# Patient Record
Sex: Female | Born: 1935 | Race: Black or African American | Hispanic: No | Marital: Married | Smoking: Former smoker
Health system: Southern US, Community
[De-identification: ages and names within clinical notes are randomized; demographics above are authoritative.]

## PROBLEM LIST (undated history)

## (undated) DIAGNOSIS — E119 Type 2 diabetes mellitus without complications: Secondary | ICD-10-CM

## (undated) DIAGNOSIS — N189 Chronic kidney disease, unspecified: Secondary | ICD-10-CM

## (undated) DIAGNOSIS — I10 Essential (primary) hypertension: Secondary | ICD-10-CM

## (undated) DIAGNOSIS — D649 Anemia, unspecified: Secondary | ICD-10-CM

## (undated) DIAGNOSIS — C801 Malignant (primary) neoplasm, unspecified: Secondary | ICD-10-CM

## (undated) DIAGNOSIS — G4730 Sleep apnea, unspecified: Secondary | ICD-10-CM

## (undated) HISTORY — DX: Malignant (primary) neoplasm, unspecified: C80.1

## (undated) HISTORY — DX: Essential (primary) hypertension: I10

## (undated) HISTORY — DX: Type 2 diabetes mellitus without complications: E11.9

## (undated) HISTORY — DX: Sleep apnea, unspecified: G47.30

---

## 1971-01-07 HISTORY — PX: TUBAL LIGATION: SHX77

## 1971-01-07 HISTORY — PX: CHOLECYSTECTOMY: SHX55

## 1980-01-07 HISTORY — PX: TOTAL ABDOMINAL HYSTERECTOMY: SHX209

## 1997-04-12 ENCOUNTER — Emergency Department (HOSPITAL_COMMUNITY): Admission: EM | Admit: 1997-04-12 | Discharge: 1997-04-12 | Payer: Self-pay | Admitting: Emergency Medicine

## 1997-12-20 ENCOUNTER — Ambulatory Visit (HOSPITAL_COMMUNITY): Admission: RE | Admit: 1997-12-20 | Discharge: 1997-12-20 | Payer: Self-pay | Admitting: Specialist

## 1998-04-02 ENCOUNTER — Ambulatory Visit (HOSPITAL_BASED_OUTPATIENT_CLINIC_OR_DEPARTMENT_OTHER): Admission: RE | Admit: 1998-04-02 | Discharge: 1998-04-02 | Payer: Self-pay | Admitting: Specialist

## 1998-04-02 ENCOUNTER — Encounter: Payer: Self-pay | Admitting: Specialist

## 2000-01-07 HISTORY — PX: BREAST REDUCTION SURGERY: SHX8

## 2001-11-30 ENCOUNTER — Encounter: Admission: RE | Admit: 2001-11-30 | Discharge: 2001-11-30 | Payer: Self-pay | Admitting: Internal Medicine

## 2002-01-03 ENCOUNTER — Ambulatory Visit (HOSPITAL_BASED_OUTPATIENT_CLINIC_OR_DEPARTMENT_OTHER): Admission: RE | Admit: 2002-01-03 | Discharge: 2002-01-03 | Payer: Self-pay | Admitting: Internal Medicine

## 2002-04-25 ENCOUNTER — Encounter: Payer: Self-pay | Admitting: Emergency Medicine

## 2002-04-25 ENCOUNTER — Inpatient Hospital Stay (HOSPITAL_COMMUNITY): Admission: EM | Admit: 2002-04-25 | Discharge: 2002-05-02 | Payer: Self-pay | Admitting: Emergency Medicine

## 2002-04-27 ENCOUNTER — Encounter: Payer: Self-pay | Admitting: Internal Medicine

## 2002-04-30 ENCOUNTER — Encounter: Payer: Self-pay | Admitting: Internal Medicine

## 2002-06-02 ENCOUNTER — Ambulatory Visit (HOSPITAL_BASED_OUTPATIENT_CLINIC_OR_DEPARTMENT_OTHER): Admission: RE | Admit: 2002-06-02 | Discharge: 2002-06-02 | Payer: Self-pay | Admitting: Critical Care Medicine

## 2002-09-21 ENCOUNTER — Encounter: Payer: Self-pay | Admitting: Gastroenterology

## 2002-09-21 ENCOUNTER — Ambulatory Visit (HOSPITAL_COMMUNITY): Admission: RE | Admit: 2002-09-21 | Discharge: 2002-09-21 | Payer: Self-pay | Admitting: Gastroenterology

## 2003-05-11 ENCOUNTER — Emergency Department (HOSPITAL_COMMUNITY): Admission: EM | Admit: 2003-05-11 | Discharge: 2003-05-11 | Payer: Self-pay | Admitting: *Deleted

## 2003-12-19 ENCOUNTER — Ambulatory Visit: Payer: Self-pay | Admitting: Internal Medicine

## 2003-12-26 ENCOUNTER — Ambulatory Visit: Payer: Self-pay | Admitting: Internal Medicine

## 2004-01-30 ENCOUNTER — Ambulatory Visit: Payer: Self-pay | Admitting: Internal Medicine

## 2004-10-02 ENCOUNTER — Inpatient Hospital Stay (HOSPITAL_COMMUNITY): Admission: AD | Admit: 2004-10-02 | Discharge: 2004-10-05 | Payer: Self-pay | Admitting: *Deleted

## 2004-10-03 ENCOUNTER — Encounter (INDEPENDENT_AMBULATORY_CARE_PROVIDER_SITE_OTHER): Payer: Self-pay | Admitting: Cardiology

## 2004-10-16 ENCOUNTER — Ambulatory Visit: Payer: Self-pay | Admitting: Oncology

## 2004-12-09 ENCOUNTER — Ambulatory Visit: Payer: Self-pay | Admitting: Oncology

## 2005-01-01 ENCOUNTER — Encounter: Admission: RE | Admit: 2005-01-01 | Discharge: 2005-01-01 | Payer: Self-pay | Admitting: Nephrology

## 2005-01-27 ENCOUNTER — Ambulatory Visit: Payer: Self-pay | Admitting: Oncology

## 2005-01-30 ENCOUNTER — Encounter: Admission: RE | Admit: 2005-01-30 | Discharge: 2005-01-30 | Payer: Self-pay | Admitting: Nephrology

## 2005-03-13 ENCOUNTER — Encounter (INDEPENDENT_AMBULATORY_CARE_PROVIDER_SITE_OTHER): Payer: Self-pay | Admitting: Specialist

## 2005-03-13 ENCOUNTER — Ambulatory Visit (HOSPITAL_COMMUNITY): Admission: RE | Admit: 2005-03-13 | Discharge: 2005-03-13 | Payer: Self-pay | Admitting: Gastroenterology

## 2005-03-17 ENCOUNTER — Ambulatory Visit: Payer: Self-pay | Admitting: Oncology

## 2005-04-15 LAB — CBC WITH DIFFERENTIAL/PLATELET
Basophils Absolute: 0 10*3/uL (ref 0.0–0.1)
EOS%: 7.9 % — ABNORMAL HIGH (ref 0.0–7.0)
Eosinophils Absolute: 0.4 10*3/uL (ref 0.0–0.5)
HCT: 36.9 % (ref 34.8–46.6)
HGB: 11.8 g/dL (ref 11.6–15.9)
MCH: 24.4 pg — ABNORMAL LOW (ref 26.0–34.0)
MCV: 76.5 fL — ABNORMAL LOW (ref 81.0–101.0)
MONO%: 6.3 % (ref 0.0–13.0)
NEUT#: 3 10*3/uL (ref 1.5–6.5)
NEUT%: 56.3 % (ref 39.6–76.8)
RDW: 18.7 % — ABNORMAL HIGH (ref 11.3–14.5)
lymph#: 1.6 10*3/uL (ref 0.9–3.3)

## 2005-04-15 LAB — IRON AND TIBC
%SAT: 14 % — ABNORMAL LOW (ref 20–55)
Iron: 43 ug/dL (ref 42–145)
UIBC: 258 ug/dL

## 2005-05-04 ENCOUNTER — Ambulatory Visit: Payer: Self-pay | Admitting: Oncology

## 2005-05-06 LAB — CBC WITH DIFFERENTIAL/PLATELET
BASO%: 0.4 % (ref 0.0–2.0)
Basophils Absolute: 0 10*3/uL (ref 0.0–0.1)
EOS%: 7 % (ref 0.0–7.0)
MCH: 24.7 pg — ABNORMAL LOW (ref 26.0–34.0)
MCHC: 32.4 g/dL (ref 32.0–36.0)
MCV: 76.2 fL — ABNORMAL LOW (ref 81.0–101.0)
MONO%: 6.5 % (ref 0.0–13.0)
RBC: 4.16 10*6/uL (ref 3.70–5.32)
RDW: 18.1 % — ABNORMAL HIGH (ref 11.3–14.5)
lymph#: 1.9 10*3/uL (ref 0.9–3.3)

## 2005-05-13 LAB — CBC WITH DIFFERENTIAL/PLATELET
BASO%: 0.3 % (ref 0.0–2.0)
LYMPH%: 29 % (ref 14.0–48.0)
MCHC: 32.5 g/dL (ref 32.0–36.0)
MCV: 76.8 fL — ABNORMAL LOW (ref 81.0–101.0)
MONO%: 2.4 % (ref 0.0–13.0)
NEUT#: 3.6 10*3/uL (ref 1.5–6.5)
Platelets: 194 10*3/uL (ref 145–400)
RBC: 3.96 10*6/uL (ref 3.70–5.32)
RDW: 18.7 % — ABNORMAL HIGH (ref 11.3–14.5)
WBC: 5.9 10*3/uL (ref 3.9–10.0)

## 2005-05-16 LAB — FERRITIN: Ferritin: 119 ng/mL (ref 10–291)

## 2005-05-16 LAB — IRON AND TIBC: Iron: 48 ug/dL (ref 42–145)

## 2005-05-20 LAB — CBC WITH DIFFERENTIAL/PLATELET
Basophils Absolute: 0 10*3/uL (ref 0.0–0.1)
Eosinophils Absolute: 0.4 10*3/uL (ref 0.0–0.5)
HCT: 29.7 % — ABNORMAL LOW (ref 34.8–46.6)
HGB: 9.9 g/dL — ABNORMAL LOW (ref 11.6–15.9)
LYMPH%: 25.9 % (ref 14.0–48.0)
MCH: 25.8 pg — ABNORMAL LOW (ref 26.0–34.0)
MCV: 77.8 fL — ABNORMAL LOW (ref 81.0–101.0)
MONO%: 5.7 % (ref 0.0–13.0)
NEUT#: 4.1 10*3/uL (ref 1.5–6.5)
NEUT%: 61.6 % (ref 39.6–76.8)
Platelets: 216 10*3/uL (ref 145–400)

## 2005-06-03 LAB — CBC WITH DIFFERENTIAL/PLATELET
EOS%: 7.1 % — ABNORMAL HIGH (ref 0.0–7.0)
Eosinophils Absolute: 0.4 10*3/uL (ref 0.0–0.5)
LYMPH%: 27.8 % (ref 14.0–48.0)
MCH: 25.6 pg — ABNORMAL LOW (ref 26.0–34.0)
MCHC: 31.9 g/dL — ABNORMAL LOW (ref 32.0–36.0)
MCV: 80.1 fL — ABNORMAL LOW (ref 81.0–101.0)
MONO%: 7.3 % (ref 0.0–13.0)
NEUT#: 2.8 10*3/uL (ref 1.5–6.5)
Platelets: 216 10*3/uL (ref 145–400)
RBC: 3.91 10*6/uL (ref 3.70–5.32)

## 2005-06-11 ENCOUNTER — Ambulatory Visit (HOSPITAL_COMMUNITY): Admission: RE | Admit: 2005-06-11 | Discharge: 2005-06-11 | Payer: Self-pay | Admitting: Gastroenterology

## 2005-06-11 ENCOUNTER — Encounter (INDEPENDENT_AMBULATORY_CARE_PROVIDER_SITE_OTHER): Payer: Self-pay | Admitting: *Deleted

## 2005-06-17 LAB — CBC WITH DIFFERENTIAL/PLATELET
BASO%: 0.4 % (ref 0.0–2.0)
Basophils Absolute: 0 10*3/uL (ref 0.0–0.1)
HCT: 31.8 % — ABNORMAL LOW (ref 34.8–46.6)
HGB: 10.3 g/dL — ABNORMAL LOW (ref 11.6–15.9)
LYMPH%: 33.4 % (ref 14.0–48.0)
MCH: 25.8 pg — ABNORMAL LOW (ref 26.0–34.0)
MCHC: 32.5 g/dL (ref 32.0–36.0)
MONO#: 0.4 10*3/uL (ref 0.1–0.9)
NEUT%: 50.9 % (ref 39.6–76.8)
Platelets: 248 10*3/uL (ref 145–400)
WBC: 5.3 10*3/uL (ref 3.9–10.0)
lymph#: 1.8 10*3/uL (ref 0.9–3.3)

## 2005-06-27 ENCOUNTER — Ambulatory Visit: Payer: Self-pay | Admitting: Oncology

## 2005-07-01 LAB — CBC WITH DIFFERENTIAL/PLATELET
BASO%: 1.7 % (ref 0.0–2.0)
Basophils Absolute: 0.1 10*3/uL (ref 0.0–0.1)
EOS%: 9.8 % — ABNORMAL HIGH (ref 0.0–7.0)
HCT: 36 % (ref 34.8–46.6)
HGB: 11.1 g/dL — ABNORMAL LOW (ref 11.6–15.9)
LYMPH%: 28.7 % (ref 14.0–48.0)
MCH: 25.5 pg — ABNORMAL LOW (ref 26.0–34.0)
MCHC: 30.8 g/dL — ABNORMAL LOW (ref 32.0–36.0)
MCV: 82.6 fL (ref 81.0–101.0)
MONO%: 6.4 % (ref 0.0–13.0)
NEUT%: 53.4 % (ref 39.6–76.8)
lymph#: 1.5 10*3/uL (ref 0.9–3.3)

## 2005-07-15 LAB — CBC WITH DIFFERENTIAL/PLATELET
BASO%: 1.3 % (ref 0.0–2.0)
Basophils Absolute: 0.1 10*3/uL (ref 0.0–0.1)
EOS%: 7.6 % — ABNORMAL HIGH (ref 0.0–7.0)
Eosinophils Absolute: 0.4 10*3/uL (ref 0.0–0.5)
HCT: 33.9 % — ABNORMAL LOW (ref 34.8–46.6)
HGB: 10.7 g/dL — ABNORMAL LOW (ref 11.6–15.9)
LYMPH%: 34.1 % (ref 14.0–48.0)
MCH: 26.1 pg (ref 26.0–34.0)
MCHC: 31.7 g/dL — ABNORMAL LOW (ref 32.0–36.0)
MCV: 82.2 fL (ref 81.0–101.0)
MONO#: 0.4 10*3/uL (ref 0.1–0.9)
MONO%: 7.5 % (ref 0.0–13.0)
NEUT#: 2.7 10*3/uL (ref 1.5–6.5)
NEUT%: 49.6 % (ref 39.6–76.8)
Platelets: 220 10*3/uL (ref 145–400)
RBC: 4.12 10*6/uL (ref 3.70–5.32)
RDW: 15.1 % — ABNORMAL HIGH (ref 11.3–14.5)
WBC: 5.4 10*3/uL (ref 3.9–10.0)
lymph#: 1.8 10*3/uL (ref 0.9–3.3)

## 2005-07-29 LAB — CBC WITH DIFFERENTIAL/PLATELET
Eosinophils Absolute: 0.5 10*3/uL (ref 0.0–0.5)
HCT: 31.9 % — ABNORMAL LOW (ref 34.8–46.6)
HGB: 10.6 g/dL — ABNORMAL LOW (ref 11.6–15.9)
LYMPH%: 27.7 % (ref 14.0–48.0)
MONO#: 0.3 10*3/uL (ref 0.1–0.9)
NEUT#: 2.9 10*3/uL (ref 1.5–6.5)
NEUT%: 57 % (ref 39.6–76.8)
Platelets: 207 10*3/uL (ref 145–400)
WBC: 5.2 10*3/uL (ref 3.9–10.0)
lymph#: 1.4 10*3/uL (ref 0.9–3.3)

## 2005-08-08 ENCOUNTER — Ambulatory Visit: Payer: Self-pay | Admitting: Oncology

## 2005-08-12 LAB — CBC WITH DIFFERENTIAL/PLATELET
BASO%: 0.9 % (ref 0.0–2.0)
Basophils Absolute: 0 10*3/uL (ref 0.0–0.1)
HCT: 34.4 % — ABNORMAL LOW (ref 34.8–46.6)
HGB: 11.4 g/dL — ABNORMAL LOW (ref 11.6–15.9)
MONO#: 0.3 10*3/uL (ref 0.1–0.9)
NEUT%: 58.5 % (ref 39.6–76.8)
WBC: 5.2 10*3/uL (ref 3.9–10.0)
lymph#: 1.4 10*3/uL (ref 0.9–3.3)

## 2005-08-14 ENCOUNTER — Ambulatory Visit: Payer: Self-pay | Admitting: Cardiology

## 2005-08-14 LAB — IRON AND TIBC
Iron: 25 ug/dL — ABNORMAL LOW (ref 42–145)
TIBC: 355 ug/dL (ref 250–470)
UIBC: 330 ug/dL

## 2005-08-14 LAB — TRANSFERRIN RECEPTOR, SOLUABLE: Transferrin Receptor, Soluble: 7.3 mg/L — ABNORMAL HIGH (ref 1.9–4.4)

## 2005-08-19 ENCOUNTER — Ambulatory Visit: Payer: Self-pay | Admitting: Cardiology

## 2005-08-20 ENCOUNTER — Ambulatory Visit (HOSPITAL_COMMUNITY): Admission: RE | Admit: 2005-08-20 | Discharge: 2005-08-20 | Payer: Self-pay | Admitting: Cardiology

## 2005-08-20 ENCOUNTER — Ambulatory Visit: Payer: Self-pay | Admitting: Cardiology

## 2005-08-25 ENCOUNTER — Ambulatory Visit: Payer: Self-pay | Admitting: Cardiology

## 2005-08-26 LAB — CBC WITH DIFFERENTIAL/PLATELET
BASO%: 0.7 % (ref 0.0–2.0)
Basophils Absolute: 0 10*3/uL (ref 0.0–0.1)
EOS%: 7.9 % — ABNORMAL HIGH (ref 0.0–7.0)
HCT: 36.5 % (ref 34.8–46.6)
HGB: 11.5 g/dL — ABNORMAL LOW (ref 11.6–15.9)
LYMPH%: 28.9 % (ref 14.0–48.0)
MCH: 25.8 pg — ABNORMAL LOW (ref 26.0–34.0)
MCHC: 31.6 g/dL — ABNORMAL LOW (ref 32.0–36.0)
MCV: 81.6 fL (ref 81.0–101.0)
MONO%: 7.4 % (ref 0.0–13.0)
NEUT%: 55 % (ref 39.6–76.8)
Platelets: 262 10*3/uL (ref 145–400)
lymph#: 1.8 10*3/uL (ref 0.9–3.3)

## 2005-09-03 ENCOUNTER — Ambulatory Visit: Payer: Self-pay

## 2005-09-09 LAB — CBC WITH DIFFERENTIAL/PLATELET
BASO%: 1.4 % (ref 0.0–2.0)
Basophils Absolute: 0.1 10*3/uL (ref 0.0–0.1)
EOS%: 8.8 % — ABNORMAL HIGH (ref 0.0–7.0)
MCH: 25.8 pg — ABNORMAL LOW (ref 26.0–34.0)
MCHC: 32.3 g/dL (ref 32.0–36.0)
MCV: 79.9 fL — ABNORMAL LOW (ref 81.0–101.0)
MONO%: 6.8 % (ref 0.0–13.0)
RBC: 4.24 10*6/uL (ref 3.70–5.32)
RDW: 14.7 % — ABNORMAL HIGH (ref 11.3–14.5)
lymph#: 1.7 10*3/uL (ref 0.9–3.3)

## 2005-09-23 ENCOUNTER — Ambulatory Visit: Payer: Self-pay | Admitting: Oncology

## 2005-09-23 LAB — CBC WITH DIFFERENTIAL/PLATELET
Basophils Absolute: 0.1 10*3/uL (ref 0.0–0.1)
EOS%: 9.1 % — ABNORMAL HIGH (ref 0.0–7.0)
Eosinophils Absolute: 0.5 10*3/uL (ref 0.0–0.5)
HGB: 10.9 g/dL — ABNORMAL LOW (ref 11.6–15.9)
MCV: 81.2 fL (ref 81.0–101.0)
MONO%: 7.8 % (ref 0.0–13.0)
NEUT#: 3.1 10*3/uL (ref 1.5–6.5)
RBC: 4.25 10*6/uL (ref 3.70–5.32)
RDW: 15.7 % — ABNORMAL HIGH (ref 11.3–14.5)
lymph#: 1.5 10*3/uL (ref 0.9–3.3)

## 2005-10-07 LAB — CBC WITH DIFFERENTIAL/PLATELET
BASO%: 1.3 % (ref 0.0–2.0)
HCT: 33.5 % — ABNORMAL LOW (ref 34.8–46.6)
MCHC: 32.7 g/dL (ref 32.0–36.0)
MONO#: 0.3 10*3/uL (ref 0.1–0.9)
RBC: 4.22 10*6/uL (ref 3.70–5.32)
RDW: 17.7 % — ABNORMAL HIGH (ref 11.3–14.5)
WBC: 5 10*3/uL (ref 3.9–10.0)
lymph#: 1.5 10*3/uL (ref 0.9–3.3)

## 2005-10-21 LAB — CBC WITH DIFFERENTIAL/PLATELET
BASO%: 1 % (ref 0.0–2.0)
Basophils Absolute: 0.1 10*3/uL (ref 0.0–0.1)
HCT: 32.8 % — ABNORMAL LOW (ref 34.8–46.6)
HGB: 10.7 g/dL — ABNORMAL LOW (ref 11.6–15.9)
MONO#: 0.4 10*3/uL (ref 0.1–0.9)
NEUT#: 3.4 10*3/uL (ref 1.5–6.5)
NEUT%: 61.7 % (ref 39.6–76.8)
WBC: 5.5 10*3/uL (ref 3.9–10.0)
lymph#: 1.3 10*3/uL (ref 0.9–3.3)

## 2005-10-23 LAB — IRON AND TIBC
%SAT: 10 % — ABNORMAL LOW (ref 20–55)
TIBC: 329 ug/dL (ref 250–470)

## 2005-10-23 LAB — TRANSFERRIN RECEPTOR, SOLUABLE: Transferrin Receptor, Soluble: 7.3 mg/L — ABNORMAL HIGH (ref 1.9–4.4)

## 2005-10-31 ENCOUNTER — Ambulatory Visit: Payer: Self-pay | Admitting: Oncology

## 2005-11-04 LAB — CBC WITH DIFFERENTIAL/PLATELET
BASO%: 1 % (ref 0.0–2.0)
MCHC: 32.4 g/dL (ref 32.0–36.0)
MONO#: 0.4 10*3/uL (ref 0.1–0.9)
RBC: 4.33 10*6/uL (ref 3.70–5.32)
RDW: 15.2 % — ABNORMAL HIGH (ref 11.3–14.5)
WBC: 5.7 10*3/uL (ref 3.9–10.0)
lymph#: 1.3 10*3/uL (ref 0.9–3.3)

## 2005-11-18 LAB — CBC WITH DIFFERENTIAL/PLATELET
Eosinophils Absolute: 0.3 10*3/uL (ref 0.0–0.5)
LYMPH%: 31.7 % (ref 14.0–48.0)
MCH: 25.9 pg — ABNORMAL LOW (ref 26.0–34.0)
MCHC: 32.3 g/dL (ref 32.0–36.0)
MCV: 80.1 fL — ABNORMAL LOW (ref 81.0–101.0)
MONO%: 7.6 % (ref 0.0–13.0)
NEUT#: 2.5 10*3/uL (ref 1.5–6.5)
Platelets: 230 10*3/uL (ref 145–400)
RBC: 4.34 10*6/uL (ref 3.70–5.32)

## 2005-12-02 LAB — CBC WITH DIFFERENTIAL/PLATELET
BASO%: 1.3 % (ref 0.0–2.0)
LYMPH%: 26.3 % (ref 14.0–48.0)
MCHC: 32 g/dL (ref 32.0–36.0)
MONO#: 0.4 10*3/uL (ref 0.1–0.9)
MONO%: 7.4 % (ref 0.0–13.0)
Platelets: 227 10*3/uL (ref 145–400)
RBC: 4.56 10*6/uL (ref 3.70–5.32)
WBC: 5.3 10*3/uL (ref 3.9–10.0)

## 2005-12-16 ENCOUNTER — Ambulatory Visit: Payer: Self-pay | Admitting: Oncology

## 2005-12-16 LAB — CBC WITH DIFFERENTIAL/PLATELET
Basophils Absolute: 0.1 10*3/uL (ref 0.0–0.1)
Eosinophils Absolute: 0.6 10*3/uL — ABNORMAL HIGH (ref 0.0–0.5)
HCT: 36.2 % (ref 34.8–46.6)
HGB: 11.3 g/dL — ABNORMAL LOW (ref 11.6–15.9)
MCV: 81.7 fL (ref 81.0–101.0)
NEUT#: 3.1 10*3/uL (ref 1.5–6.5)
NEUT%: 51.6 % (ref 39.6–76.8)
RDW: 14.6 % — ABNORMAL HIGH (ref 11.3–14.5)
lymph#: 1.8 10*3/uL (ref 0.9–3.3)

## 2005-12-29 LAB — CBC WITH DIFFERENTIAL/PLATELET
Basophils Absolute: 0.1 10*3/uL (ref 0.0–0.1)
Eosinophils Absolute: 0.4 10*3/uL (ref 0.0–0.5)
HCT: 34.2 % — ABNORMAL LOW (ref 34.8–46.6)
HGB: 10.7 g/dL — ABNORMAL LOW (ref 11.6–15.9)
LYMPH%: 27.1 % (ref 14.0–48.0)
MCV: 80.3 fL — ABNORMAL LOW (ref 81.0–101.0)
MONO%: 6.4 % (ref 0.0–13.0)
NEUT#: 3.2 10*3/uL (ref 1.5–6.5)
NEUT%: 57.7 % (ref 39.6–76.8)
Platelets: 200 10*3/uL (ref 145–400)

## 2006-01-12 LAB — IRON AND TIBC
%SAT: 15 % — ABNORMAL LOW (ref 20–55)
Iron: 46 ug/dL (ref 42–145)
TIBC: 303 ug/dL (ref 250–470)
UIBC: 257 ug/dL

## 2006-01-12 LAB — CBC WITH DIFFERENTIAL/PLATELET
BASO%: 1.4 % (ref 0.0–2.0)
Eosinophils Absolute: 0.4 10*3/uL (ref 0.0–0.5)
MCHC: 31.7 g/dL — ABNORMAL LOW (ref 32.0–36.0)
MONO#: 0.4 10*3/uL (ref 0.1–0.9)
NEUT#: 2.8 10*3/uL (ref 1.5–6.5)
Platelets: 226 10*3/uL (ref 145–400)
RBC: 4.21 10*6/uL (ref 3.70–5.32)
WBC: 5 10*3/uL (ref 3.9–10.0)
lymph#: 1.3 10*3/uL (ref 0.9–3.3)

## 2006-01-12 LAB — FERRITIN: Ferritin: 127 ng/mL (ref 10–291)

## 2006-01-27 ENCOUNTER — Ambulatory Visit: Payer: Self-pay | Admitting: Oncology

## 2006-01-27 LAB — CBC WITH DIFFERENTIAL/PLATELET
Eosinophils Absolute: 0.4 10*3/uL (ref 0.0–0.5)
HCT: 36.1 % (ref 34.8–46.6)
HGB: 11.2 g/dL — ABNORMAL LOW (ref 11.6–15.9)
LYMPH%: 28.4 % (ref 14.0–48.0)
MONO#: 0.4 10*3/uL (ref 0.1–0.9)
NEUT#: 3.2 10*3/uL (ref 1.5–6.5)
NEUT%: 56.1 % (ref 39.6–76.8)
Platelets: 270 10*3/uL (ref 145–400)
WBC: 5.8 10*3/uL (ref 3.9–10.0)
lymph#: 1.6 10*3/uL (ref 0.9–3.3)

## 2006-02-10 LAB — CBC WITH DIFFERENTIAL/PLATELET
BASO%: 1.4 % (ref 0.0–2.0)
Basophils Absolute: 0.1 10*3/uL (ref 0.0–0.1)
EOS%: 4.8 % (ref 0.0–7.0)
HCT: 32.9 % — ABNORMAL LOW (ref 34.8–46.6)
HGB: 10.6 g/dL — ABNORMAL LOW (ref 11.6–15.9)
LYMPH%: 27.5 % (ref 14.0–48.0)
MCH: 26 pg (ref 26.0–34.0)
MCHC: 32.1 g/dL (ref 32.0–36.0)
MONO#: 0.4 10*3/uL (ref 0.1–0.9)
NEUT%: 59.4 % (ref 39.6–76.8)
Platelets: 199 10*3/uL (ref 145–400)
lymph#: 1.6 10*3/uL (ref 0.9–3.3)

## 2006-03-10 LAB — CBC WITH DIFFERENTIAL/PLATELET
BASO%: 1.3 % (ref 0.0–2.0)
Basophils Absolute: 0.1 10*3/uL (ref 0.0–0.1)
EOS%: 5.8 % (ref 0.0–7.0)
HCT: 32.8 % — ABNORMAL LOW (ref 34.8–46.6)
HGB: 10.4 g/dL — ABNORMAL LOW (ref 11.6–15.9)
MCH: 25.8 pg — ABNORMAL LOW (ref 26.0–34.0)
MCHC: 31.7 g/dL — ABNORMAL LOW (ref 32.0–36.0)
MCV: 81.5 fL (ref 81.0–101.0)
MONO%: 7.7 % (ref 0.0–13.0)
NEUT%: 56.4 % (ref 39.6–76.8)
lymph#: 1.7 10*3/uL (ref 0.9–3.3)

## 2006-03-19 ENCOUNTER — Ambulatory Visit: Payer: Self-pay | Admitting: Oncology

## 2006-03-24 LAB — CBC WITH DIFFERENTIAL/PLATELET
BASO%: 1.3 % (ref 0.0–2.0)
EOS%: 5 % (ref 0.0–7.0)
MCH: 27.2 pg (ref 26.0–34.0)
MCHC: 32.8 g/dL (ref 32.0–36.0)
MCV: 82.8 fL (ref 81.0–101.0)
MONO%: 7.2 % (ref 0.0–13.0)
RBC: 4.15 10*6/uL (ref 3.70–5.32)
RDW: 14.9 % — ABNORMAL HIGH (ref 11.3–14.5)
lymph#: 1.8 10*3/uL (ref 0.9–3.3)

## 2006-04-07 LAB — CBC WITH DIFFERENTIAL/PLATELET
Eosinophils Absolute: 0.4 10*3/uL (ref 0.0–0.5)
HGB: 11.2 g/dL — ABNORMAL LOW (ref 11.6–15.9)
MCV: 80.3 fL — ABNORMAL LOW (ref 81.0–101.0)
MONO%: 4 % (ref 0.0–13.0)
NEUT#: 2.8 10*3/uL (ref 1.5–6.5)
RBC: 4.16 10*6/uL (ref 3.70–5.32)
RDW: 16.7 % — ABNORMAL HIGH (ref 11.3–14.5)
WBC: 4.7 10*3/uL (ref 3.9–10.0)
lymph#: 1.3 10*3/uL (ref 0.9–3.3)

## 2006-04-07 LAB — IRON AND TIBC
%SAT: 13 % — ABNORMAL LOW (ref 20–55)
Iron: 42 ug/dL (ref 42–145)
TIBC: 329 ug/dL (ref 250–470)
UIBC: 287 ug/dL

## 2006-04-21 LAB — CBC WITH DIFFERENTIAL/PLATELET
Basophils Absolute: 0.1 10*3/uL (ref 0.0–0.1)
Eosinophils Absolute: 0.3 10*3/uL (ref 0.0–0.5)
HCT: 35.9 % (ref 34.8–46.6)
HGB: 11.3 g/dL — ABNORMAL LOW (ref 11.6–15.9)
LYMPH%: 24.3 % (ref 14.0–48.0)
MCV: 82 fL (ref 81.0–101.0)
MONO#: 0.5 10*3/uL (ref 0.1–0.9)
NEUT#: 3.4 10*3/uL (ref 1.5–6.5)
NEUT%: 60.2 % (ref 39.6–76.8)
Platelets: 241 10*3/uL (ref 145–400)
WBC: 5.7 10*3/uL (ref 3.9–10.0)

## 2006-05-05 ENCOUNTER — Ambulatory Visit: Payer: Self-pay | Admitting: Oncology

## 2006-05-05 LAB — CBC WITH DIFFERENTIAL/PLATELET
BASO%: 1.3 % (ref 0.0–2.0)
HCT: 35.5 % (ref 34.8–46.6)
LYMPH%: 24.3 % (ref 14.0–48.0)
MCH: 26.8 pg (ref 26.0–34.0)
MCHC: 32.6 g/dL (ref 32.0–36.0)
MCV: 82 fL (ref 81.0–101.0)
MONO#: 0.4 10*3/uL (ref 0.1–0.9)
MONO%: 8.4 % (ref 0.0–13.0)
NEUT%: 60 % (ref 39.6–76.8)
Platelets: 223 10*3/uL (ref 145–400)
WBC: 5 10*3/uL (ref 3.9–10.0)

## 2006-06-02 LAB — CBC WITH DIFFERENTIAL/PLATELET
BASO%: 0.7 % (ref 0.0–2.0)
EOS%: 4.5 % (ref 0.0–7.0)
HCT: 36.1 % (ref 34.8–46.6)
LYMPH%: 28.9 % (ref 14.0–48.0)
MCH: 25.7 pg — ABNORMAL LOW (ref 26.0–34.0)
MCHC: 32 g/dL (ref 32.0–36.0)
NEUT%: 57.2 % (ref 39.6–76.8)
lymph#: 1.6 10*3/uL (ref 0.9–3.3)

## 2006-06-16 LAB — CBC WITH DIFFERENTIAL/PLATELET
Basophils Absolute: 0 10*3/uL (ref 0.0–0.1)
Eosinophils Absolute: 0.4 10*3/uL (ref 0.0–0.5)
HCT: 33.1 % — ABNORMAL LOW (ref 34.8–46.6)
HGB: 10.7 g/dL — ABNORMAL LOW (ref 11.6–15.9)
LYMPH%: 30 % (ref 14.0–48.0)
MCV: 80.3 fL — ABNORMAL LOW (ref 81.0–101.0)
MONO#: 0.4 10*3/uL (ref 0.1–0.9)
MONO%: 7.6 % (ref 0.0–13.0)
NEUT#: 3.2 10*3/uL (ref 1.5–6.5)
Platelets: 180 10*3/uL (ref 145–400)
RBC: 4.12 10*6/uL (ref 3.70–5.32)
WBC: 5.9 10*3/uL (ref 3.9–10.0)

## 2006-06-25 ENCOUNTER — Ambulatory Visit: Payer: Self-pay | Admitting: Oncology

## 2006-06-30 LAB — CBC WITH DIFFERENTIAL/PLATELET
Eosinophils Absolute: 0.3 10*3/uL (ref 0.0–0.5)
HCT: 35.9 % (ref 34.8–46.6)
LYMPH%: 25.2 % (ref 14.0–48.0)
MCHC: 32 g/dL (ref 32.0–36.0)
MCV: 80.3 fL — ABNORMAL LOW (ref 81.0–101.0)
MONO#: 0.4 10*3/uL (ref 0.1–0.9)
MONO%: 7.9 % (ref 0.0–13.0)
NEUT#: 2.9 10*3/uL (ref 1.5–6.5)
NEUT%: 58.5 % (ref 39.6–76.8)
Platelets: 224 10*3/uL (ref 145–400)
RBC: 4.48 10*6/uL (ref 3.70–5.32)
WBC: 4.9 10*3/uL (ref 3.9–10.0)

## 2006-07-07 LAB — CBC WITH DIFFERENTIAL/PLATELET
BASO%: 1.4 % (ref 0.0–2.0)
EOS%: 5.3 % (ref 0.0–7.0)
HCT: 33.8 % — ABNORMAL LOW (ref 34.8–46.6)
LYMPH%: 34.8 % (ref 14.0–48.0)
MCH: 26.6 pg (ref 26.0–34.0)
MCHC: 33.2 g/dL (ref 32.0–36.0)
MONO%: 6.7 % (ref 0.0–13.0)
NEUT%: 51.8 % (ref 39.6–76.8)
Platelets: 213 10*3/uL (ref 145–400)
RBC: 4.23 10*6/uL (ref 3.70–5.32)

## 2006-07-28 LAB — CBC WITH DIFFERENTIAL/PLATELET
BASO%: 2.4 % — ABNORMAL HIGH (ref 0.0–2.0)
EOS%: 6.8 % (ref 0.0–7.0)
MCH: 26.4 pg (ref 26.0–34.0)
MCHC: 32.2 g/dL (ref 32.0–36.0)
NEUT%: 55.7 % (ref 39.6–76.8)
RBC: 4.18 10*6/uL (ref 3.70–5.32)
RDW: 14.4 % (ref 11.3–14.5)
WBC: 5.7 10*3/uL (ref 3.9–10.0)
lymph#: 1.6 10*3/uL (ref 0.9–3.3)

## 2006-08-13 ENCOUNTER — Ambulatory Visit: Payer: Self-pay | Admitting: Oncology

## 2006-08-18 LAB — CBC WITH DIFFERENTIAL/PLATELET
Basophils Absolute: 0 10*3/uL (ref 0.0–0.1)
EOS%: 6.5 % (ref 0.0–7.0)
HGB: 11.2 g/dL — ABNORMAL LOW (ref 11.6–15.9)
MCH: 27.1 pg (ref 26.0–34.0)
MONO#: 0.4 10*3/uL (ref 0.1–0.9)
NEUT#: 2.8 10*3/uL (ref 1.5–6.5)
RDW: 16.3 % — ABNORMAL HIGH (ref 11.3–14.5)
WBC: 5.3 10*3/uL (ref 3.9–10.0)
lymph#: 1.7 10*3/uL (ref 0.9–3.3)

## 2006-08-18 LAB — IRON AND TIBC
%SAT: 18 % — ABNORMAL LOW (ref 20–55)
TIBC: 289 ug/dL (ref 250–470)
UIBC: 236 ug/dL

## 2006-08-18 LAB — FERRITIN: Ferritin: 171 ng/mL (ref 10–291)

## 2006-09-08 LAB — CBC WITH DIFFERENTIAL/PLATELET
BASO%: 1 % (ref 0.0–2.0)
Eosinophils Absolute: 0.3 10*3/uL (ref 0.0–0.5)
HCT: 33.9 % — ABNORMAL LOW (ref 34.8–46.6)
HGB: 11.2 g/dL — ABNORMAL LOW (ref 11.6–15.9)
LYMPH%: 28.6 % (ref 14.0–48.0)
MONO#: 0.4 10*3/uL (ref 0.1–0.9)
NEUT#: 2.9 10*3/uL (ref 1.5–6.5)
NEUT%: 56.5 % (ref 39.6–76.8)
Platelets: 202 10*3/uL (ref 145–400)
WBC: 5.1 10*3/uL (ref 3.9–10.0)
lymph#: 1.5 10*3/uL (ref 0.9–3.3)

## 2006-09-24 ENCOUNTER — Ambulatory Visit: Payer: Self-pay | Admitting: Oncology

## 2006-09-29 LAB — CBC WITH DIFFERENTIAL/PLATELET
BASO%: 0.9 % (ref 0.0–2.0)
Basophils Absolute: 0 10*3/uL (ref 0.0–0.1)
EOS%: 5.8 % (ref 0.0–7.0)
HCT: 35.6 % (ref 34.8–46.6)
HGB: 11.5 g/dL — ABNORMAL LOW (ref 11.6–15.9)
LYMPH%: 29.1 % (ref 14.0–48.0)
MCH: 26.5 pg (ref 26.0–34.0)
MCHC: 32.2 g/dL (ref 32.0–36.0)
MONO#: 0.3 10*3/uL (ref 0.1–0.9)
NEUT%: 57.5 % (ref 39.6–76.8)
Platelets: 182 10*3/uL (ref 145–400)
lymph#: 1.5 10*3/uL (ref 0.9–3.3)

## 2006-10-20 LAB — CBC WITH DIFFERENTIAL/PLATELET
BASO%: 1.2 % (ref 0.0–2.0)
Basophils Absolute: 0.1 10*3/uL (ref 0.0–0.1)
EOS%: 2.8 % (ref 0.0–7.0)
HCT: 37.3 % (ref 34.8–46.6)
HGB: 12.3 g/dL (ref 11.6–15.9)
MCH: 27.8 pg (ref 26.0–34.0)
MCHC: 33 g/dL (ref 32.0–36.0)
MCV: 84.2 fL (ref 81.0–101.0)
MONO%: 6 % (ref 0.0–13.0)
NEUT%: 60.3 % (ref 39.6–76.8)

## 2006-11-06 ENCOUNTER — Ambulatory Visit: Payer: Self-pay | Admitting: Oncology

## 2006-11-10 LAB — CBC WITH DIFFERENTIAL/PLATELET
BASO%: 0.7 % (ref 0.0–2.0)
EOS%: 5.6 % (ref 0.0–7.0)
MCH: 26.9 pg (ref 26.0–34.0)
MCHC: 32.7 g/dL (ref 32.0–36.0)
MCV: 82.2 fL (ref 81.0–101.0)
MONO%: 6.9 % (ref 0.0–13.0)
RBC: 4.1 10*6/uL (ref 3.70–5.32)
RDW: 13.1 % (ref 11.3–14.5)
lymph#: 1.5 10*3/uL (ref 0.9–3.3)

## 2006-12-01 LAB — CBC WITH DIFFERENTIAL/PLATELET
BASO%: 1.1 % (ref 0.0–2.0)
EOS%: 5.8 % (ref 0.0–7.0)
HCT: 34.8 % (ref 34.8–46.6)
LYMPH%: 27.1 % (ref 14.0–48.0)
MCH: 26.4 pg (ref 26.0–34.0)
MCHC: 32.2 g/dL (ref 32.0–36.0)
NEUT%: 57 % (ref 39.6–76.8)
Platelets: 204 10*3/uL (ref 145–400)

## 2006-12-22 ENCOUNTER — Ambulatory Visit: Payer: Self-pay | Admitting: Oncology

## 2006-12-22 LAB — CBC WITH DIFFERENTIAL/PLATELET
BASO%: 1.6 % (ref 0.0–2.0)
Basophils Absolute: 0.1 10*3/uL (ref 0.0–0.1)
EOS%: 5.3 % (ref 0.0–7.0)
Eosinophils Absolute: 0.3 10*3/uL (ref 0.0–0.5)
HCT: 35.7 % (ref 34.8–46.6)
HGB: 11.4 g/dL — ABNORMAL LOW (ref 11.6–15.9)
LYMPH%: 33 % (ref 14.0–48.0)
MCH: 26.8 pg (ref 26.0–34.0)
MCHC: 32.1 g/dL (ref 32.0–36.0)
MCV: 83.5 fL (ref 81.0–101.0)
MONO#: 0.4 10*3/uL (ref 0.1–0.9)
MONO%: 8.6 % (ref 0.0–13.0)
NEUT#: 2.5 10*3/uL (ref 1.5–6.5)
NEUT%: 51.5 % (ref 39.6–76.8)
Platelets: 210 10*3/uL (ref 145–400)
RBC: 4.27 10*6/uL (ref 3.70–5.32)
RDW: 13.2 % (ref 11.3–14.5)
WBC: 4.9 10*3/uL (ref 3.9–10.0)
lymph#: 1.6 10*3/uL (ref 0.9–3.3)

## 2006-12-24 ENCOUNTER — Ambulatory Visit: Payer: Self-pay | Admitting: Cardiology

## 2007-01-18 ENCOUNTER — Encounter: Payer: Self-pay | Admitting: Cardiology

## 2007-01-18 ENCOUNTER — Ambulatory Visit: Payer: Self-pay

## 2007-01-19 LAB — CBC WITH DIFFERENTIAL/PLATELET
Basophils Absolute: 0.1 10*3/uL (ref 0.0–0.1)
EOS%: 5.7 % (ref 0.0–7.0)
HGB: 11.4 g/dL — ABNORMAL LOW (ref 11.6–15.9)
MCH: 27.3 pg (ref 26.0–34.0)
NEUT#: 3.7 10*3/uL (ref 1.5–6.5)
RBC: 4.17 10*6/uL (ref 3.70–5.32)
RDW: 12.7 % (ref 11.3–14.5)
lymph#: 2.1 10*3/uL (ref 0.9–3.3)

## 2007-02-12 ENCOUNTER — Ambulatory Visit: Payer: Self-pay | Admitting: Oncology

## 2007-02-16 LAB — CBC WITH DIFFERENTIAL/PLATELET
BASO%: 1.3 % (ref 0.0–2.0)
Basophils Absolute: 0.1 10*3/uL (ref 0.0–0.1)
HCT: 34.8 % (ref 34.8–46.6)
LYMPH%: 24.7 % (ref 14.0–48.0)
MCH: 26.7 pg (ref 26.0–34.0)
MCHC: 32.5 g/dL (ref 32.0–36.0)
MONO#: 0.3 10*3/uL (ref 0.1–0.9)
NEUT%: 62.8 % (ref 39.6–76.8)
Platelets: 234 10*3/uL (ref 145–400)
WBC: 6 10*3/uL (ref 3.9–10.0)

## 2007-02-22 ENCOUNTER — Ambulatory Visit: Payer: Self-pay | Admitting: Cardiology

## 2007-03-11 ENCOUNTER — Ambulatory Visit: Payer: Self-pay

## 2007-03-16 LAB — CBC WITH DIFFERENTIAL/PLATELET
Eosinophils Absolute: 0.3 10*3/uL (ref 0.0–0.5)
HCT: 34.8 % (ref 34.8–46.6)
LYMPH%: 29.6 % (ref 14.0–48.0)
MONO#: 0.5 10*3/uL (ref 0.1–0.9)
NEUT#: 3.9 10*3/uL (ref 1.5–6.5)
NEUT%: 57.4 % (ref 39.6–76.8)
Platelets: 183 10*3/uL (ref 145–400)
WBC: 6.7 10*3/uL (ref 3.9–10.0)

## 2007-04-15 ENCOUNTER — Ambulatory Visit: Payer: Self-pay | Admitting: Oncology

## 2007-04-21 LAB — CBC WITH DIFFERENTIAL/PLATELET
Basophils Absolute: 0 10*3/uL (ref 0.0–0.1)
EOS%: 4.9 % (ref 0.0–7.0)
HCT: 31.6 % — ABNORMAL LOW (ref 34.8–46.6)
HGB: 10.6 g/dL — ABNORMAL LOW (ref 11.6–15.9)
MCH: 26.9 pg (ref 26.0–34.0)
MCV: 80 fL — ABNORMAL LOW (ref 81.0–101.0)
MONO%: 4.8 % (ref 0.0–13.0)
NEUT%: 59.9 % (ref 39.6–76.8)

## 2007-04-23 ENCOUNTER — Emergency Department (HOSPITAL_COMMUNITY): Admission: EM | Admit: 2007-04-23 | Discharge: 2007-04-23 | Payer: Self-pay | Admitting: Emergency Medicine

## 2007-06-25 ENCOUNTER — Ambulatory Visit: Payer: Self-pay | Admitting: Oncology

## 2007-06-29 LAB — CBC WITH DIFFERENTIAL/PLATELET
BASO%: 1.2 % (ref 0.0–2.0)
EOS%: 5.2 % (ref 0.0–7.0)
Eosinophils Absolute: 0.3 10*3/uL (ref 0.0–0.5)
LYMPH%: 28.9 % (ref 14.0–48.0)
MCH: 26.9 pg (ref 26.0–34.0)
MCHC: 32.5 g/dL (ref 32.0–36.0)
MCV: 82.8 fL (ref 81.0–101.0)
MONO%: 7.4 % (ref 0.0–13.0)
Platelets: 182 10*3/uL (ref 145–400)
RBC: 4.05 10*6/uL (ref 3.70–5.32)

## 2007-07-29 ENCOUNTER — Ambulatory Visit: Payer: Self-pay | Admitting: Oncology

## 2007-08-03 LAB — CBC WITH DIFFERENTIAL/PLATELET
Basophils Absolute: 0.1 10*3/uL (ref 0.0–0.1)
EOS%: 6.4 % (ref 0.0–7.0)
Eosinophils Absolute: 0.4 10*3/uL (ref 0.0–0.5)
HGB: 10.8 g/dL — ABNORMAL LOW (ref 11.6–15.9)
LYMPH%: 30.5 % (ref 14.0–48.0)
MCH: 27.1 pg (ref 26.0–34.0)
MCV: 82.8 fL (ref 81.0–101.0)
MONO%: 6.1 % (ref 0.0–13.0)
NEUT#: 3.1 10*3/uL (ref 1.5–6.5)
Platelets: 177 10*3/uL (ref 145–400)

## 2007-08-31 ENCOUNTER — Ambulatory Visit: Payer: Self-pay

## 2007-09-07 LAB — CBC WITH DIFFERENTIAL/PLATELET
Basophils Absolute: 0.1 10*3/uL (ref 0.0–0.1)
Eosinophils Absolute: 0.4 10*3/uL (ref 0.0–0.5)
HCT: 33.6 % — ABNORMAL LOW (ref 34.8–46.6)
HGB: 10.9 g/dL — ABNORMAL LOW (ref 11.6–15.9)
MCH: 27.1 pg (ref 26.0–34.0)
MCV: 83.6 fL (ref 81.0–101.0)
MONO%: 6 % (ref 0.0–13.0)
NEUT#: 3.8 10*3/uL (ref 1.5–6.5)
NEUT%: 59.8 % (ref 39.6–76.8)
RDW: 14.8 % — ABNORMAL HIGH (ref 11.3–14.5)
lymph#: 1.7 10*3/uL (ref 0.9–3.3)

## 2007-10-08 ENCOUNTER — Ambulatory Visit: Payer: Self-pay | Admitting: Oncology

## 2007-10-12 LAB — CBC WITH DIFFERENTIAL/PLATELET
Basophils Absolute: 0.1 10*3/uL (ref 0.0–0.1)
EOS%: 5 % (ref 0.0–7.0)
HCT: 32.9 % — ABNORMAL LOW (ref 34.8–46.6)
HGB: 10.8 g/dL — ABNORMAL LOW (ref 11.6–15.9)
MCH: 26.7 pg (ref 26.0–34.0)
MONO#: 0.4 10*3/uL (ref 0.1–0.9)
NEUT#: 3.8 10*3/uL (ref 1.5–6.5)
NEUT%: 60.1 % (ref 39.6–76.8)
RDW: 14.2 % (ref 11.3–14.5)
WBC: 6.4 10*3/uL (ref 3.9–10.0)
lymph#: 1.8 10*3/uL (ref 0.9–3.3)

## 2007-11-16 LAB — CBC WITH DIFFERENTIAL/PLATELET
BASO%: 0.8 % (ref 0.0–2.0)
EOS%: 3.6 % (ref 0.0–7.0)
HCT: 32.9 % — ABNORMAL LOW (ref 34.8–46.6)
MCH: 26.8 pg (ref 26.0–34.0)
MCHC: 32.4 g/dL (ref 32.0–36.0)
MONO#: 0.5 10*3/uL (ref 0.1–0.9)
RBC: 3.98 10*6/uL (ref 3.70–5.32)
RDW: 14.6 % — ABNORMAL HIGH (ref 11.3–14.5)
WBC: 8 10*3/uL (ref 3.9–10.0)
lymph#: 1.7 10*3/uL (ref 0.9–3.3)

## 2007-12-17 ENCOUNTER — Ambulatory Visit: Payer: Self-pay | Admitting: Oncology

## 2007-12-21 LAB — CBC WITH DIFFERENTIAL/PLATELET
BASO%: 1.3 % (ref 0.0–2.0)
EOS%: 5.5 % (ref 0.0–7.0)
HCT: 31.2 % — ABNORMAL LOW (ref 34.8–46.6)
LYMPH%: 27.7 % (ref 14.0–48.0)
MCH: 27.1 pg (ref 26.0–34.0)
MCHC: 32.9 g/dL (ref 32.0–36.0)
MONO#: 0.5 10*3/uL (ref 0.1–0.9)
NEUT%: 58.9 % (ref 39.6–76.8)
Platelets: 203 10*3/uL (ref 145–400)
RBC: 3.79 10*6/uL (ref 3.70–5.32)
WBC: 7.2 10*3/uL (ref 3.9–10.0)

## 2008-01-25 LAB — CBC WITH DIFFERENTIAL/PLATELET
BASO%: 0.5 % (ref 0.0–2.0)
LYMPH%: 29.1 % (ref 14.0–48.0)
MCHC: 32.9 g/dL (ref 32.0–36.0)
MONO#: 0.3 10*3/uL (ref 0.1–0.9)
NEUT#: 3.4 10*3/uL (ref 1.5–6.5)
Platelets: 210 10*3/uL (ref 145–400)
RBC: 3.79 10*6/uL (ref 3.70–5.32)
RDW: 15.1 % — ABNORMAL HIGH (ref 11.3–14.5)
WBC: 5.8 10*3/uL (ref 3.9–10.0)

## 2008-01-25 LAB — IRON AND TIBC
%SAT: 17 % — ABNORMAL LOW (ref 20–55)
Iron: 54 ug/dL (ref 42–145)
UIBC: 255 ug/dL

## 2008-02-07 ENCOUNTER — Emergency Department (HOSPITAL_COMMUNITY): Admission: EM | Admit: 2008-02-07 | Discharge: 2008-02-07 | Payer: Self-pay | Admitting: Emergency Medicine

## 2008-02-14 ENCOUNTER — Ambulatory Visit: Payer: Self-pay | Admitting: Cardiology

## 2008-02-25 ENCOUNTER — Ambulatory Visit: Payer: Self-pay | Admitting: Oncology

## 2008-02-29 LAB — CBC WITH DIFFERENTIAL/PLATELET
BASO%: 0.5 % (ref 0.0–2.0)
EOS%: 5.7 % (ref 0.0–7.0)
LYMPH%: 24.7 % (ref 14.0–49.7)
MCH: 27.3 pg (ref 25.1–34.0)
MCHC: 33.1 g/dL (ref 31.5–36.0)
MCV: 82.4 fL (ref 79.5–101.0)
MONO%: 7.8 % (ref 0.0–14.0)
Platelets: 229 10*3/uL (ref 145–400)
RBC: 3.6 10*6/uL — ABNORMAL LOW (ref 3.70–5.45)
RDW: 15.6 % — ABNORMAL HIGH (ref 11.2–14.5)

## 2008-03-06 ENCOUNTER — Encounter: Admission: RE | Admit: 2008-03-06 | Discharge: 2008-03-06 | Payer: Self-pay | Admitting: Sports Medicine

## 2008-03-21 LAB — CBC WITH DIFFERENTIAL/PLATELET
BASO%: 0.4 % (ref 0.0–2.0)
Basophils Absolute: 0 10*3/uL (ref 0.0–0.1)
EOS%: 4.6 % (ref 0.0–7.0)
HCT: 33.7 % — ABNORMAL LOW (ref 34.8–46.6)
HGB: 10.4 g/dL — ABNORMAL LOW (ref 11.6–15.9)
MCH: 26.1 pg (ref 25.1–34.0)
MCHC: 30.9 g/dL — ABNORMAL LOW (ref 31.5–36.0)
MCV: 84.5 fL (ref 79.5–101.0)
MONO%: 6.6 % (ref 0.0–14.0)
NEUT%: 66.9 % (ref 38.4–76.8)
lymph#: 1.6 10*3/uL (ref 0.9–3.3)

## 2008-04-06 ENCOUNTER — Ambulatory Visit: Payer: Self-pay | Admitting: Oncology

## 2008-04-11 LAB — CBC WITH DIFFERENTIAL/PLATELET
BASO%: 0.5 % (ref 0.0–2.0)
Eosinophils Absolute: 0.3 10*3/uL (ref 0.0–0.5)
HCT: 33.8 % — ABNORMAL LOW (ref 34.8–46.6)
LYMPH%: 28.7 % (ref 14.0–49.7)
MCHC: 31.4 g/dL — ABNORMAL LOW (ref 31.5–36.0)
MONO#: 0.4 10*3/uL (ref 0.1–0.9)
NEUT#: 3.7 10*3/uL (ref 1.5–6.5)
NEUT%: 59.6 % (ref 38.4–76.8)
Platelets: 229 10*3/uL (ref 145–400)
RBC: 4.07 10*6/uL (ref 3.70–5.45)
WBC: 6.2 10*3/uL (ref 3.9–10.3)
lymph#: 1.8 10*3/uL (ref 0.9–3.3)

## 2008-05-02 LAB — CBC WITH DIFFERENTIAL/PLATELET
Basophils Absolute: 0 10*3/uL (ref 0.0–0.1)
HCT: 33.7 % — ABNORMAL LOW (ref 34.8–46.6)
HGB: 10.4 g/dL — ABNORMAL LOW (ref 11.6–15.9)
LYMPH%: 24.8 % (ref 14.0–49.7)
MONO#: 0.4 10*3/uL (ref 0.1–0.9)
NEUT%: 60.9 % (ref 38.4–76.8)
Platelets: 202 10*3/uL (ref 145–400)
WBC: 5.7 10*3/uL (ref 3.9–10.3)
lymph#: 1.4 10*3/uL (ref 0.9–3.3)

## 2008-05-19 ENCOUNTER — Ambulatory Visit: Payer: Self-pay | Admitting: Oncology

## 2008-05-23 LAB — CBC WITH DIFFERENTIAL/PLATELET
Basophils Absolute: 0 10*3/uL (ref 0.0–0.1)
Eosinophils Absolute: 0.4 10*3/uL (ref 0.0–0.5)
HGB: 11 g/dL — ABNORMAL LOW (ref 11.6–15.9)
MCV: 84 fL (ref 79.5–101.0)
MONO%: 6 % (ref 0.0–14.0)
NEUT#: 3.3 10*3/uL (ref 1.5–6.5)
RBC: 4.25 10*6/uL (ref 3.70–5.45)
RDW: 16.1 % — ABNORMAL HIGH (ref 11.2–14.5)
WBC: 5.6 10*3/uL (ref 3.9–10.3)
lymph#: 1.6 10*3/uL (ref 0.9–3.3)
nRBC: 0 % (ref 0–0)

## 2008-06-13 LAB — CBC WITH DIFFERENTIAL/PLATELET
BASO%: 0.4 % (ref 0.0–2.0)
Basophils Absolute: 0 10*3/uL (ref 0.0–0.1)
EOS%: 7 % (ref 0.0–7.0)
HCT: 35.3 % (ref 34.8–46.6)
MCH: 26 pg (ref 25.1–34.0)
MCHC: 31.2 g/dL — ABNORMAL LOW (ref 31.5–36.0)
MCV: 83.5 fL (ref 79.5–101.0)
MONO%: 6.1 % (ref 0.0–14.0)
NEUT%: 56.6 % (ref 38.4–76.8)
lymph#: 1.6 10*3/uL (ref 0.9–3.3)

## 2008-06-30 ENCOUNTER — Ambulatory Visit: Payer: Self-pay | Admitting: Oncology

## 2008-07-04 LAB — CBC WITH DIFFERENTIAL/PLATELET
BASO%: 0.8 % (ref 0.0–2.0)
Basophils Absolute: 0 10*3/uL (ref 0.0–0.1)
EOS%: 6.2 % (ref 0.0–7.0)
HGB: 11.7 g/dL (ref 11.6–15.9)
MCH: 26.1 pg (ref 25.1–34.0)
MCHC: 31.5 g/dL (ref 31.5–36.0)
MCV: 83 fL (ref 79.5–101.0)
MONO%: 7 % (ref 0.0–14.0)
RBC: 4.48 10*6/uL (ref 3.70–5.45)
RDW: 15.9 % — ABNORMAL HIGH (ref 11.2–14.5)
lymph#: 1.6 10*3/uL (ref 0.9–3.3)

## 2008-07-18 LAB — CBC WITH DIFFERENTIAL/PLATELET
BASO%: 0.7 % (ref 0.0–2.0)
Basophils Absolute: 0 10*3/uL (ref 0.0–0.1)
EOS%: 7.5 % — ABNORMAL HIGH (ref 0.0–7.0)
Eosinophils Absolute: 0.4 10*3/uL (ref 0.0–0.5)
HCT: 35.9 % (ref 34.8–46.6)
HGB: 11.7 g/dL (ref 11.6–15.9)
LYMPH%: 23.9 % (ref 14.0–49.7)
MCH: 26.6 pg (ref 25.1–34.0)
MCHC: 32.5 g/dL (ref 31.5–36.0)
MCV: 82.1 fL (ref 79.5–101.0)
MONO#: 0.4 10*3/uL (ref 0.1–0.9)
MONO%: 7.4 % (ref 0.0–14.0)
NEUT#: 3 10*3/uL (ref 1.5–6.5)
NEUT%: 60.5 % (ref 38.4–76.8)
Platelets: 200 10*3/uL (ref 145–400)
RBC: 4.38 10*6/uL (ref 3.70–5.45)
RDW: 17.2 % — ABNORMAL HIGH (ref 11.2–14.5)
WBC: 5 10*3/uL (ref 3.9–10.3)
lymph#: 1.2 10*3/uL (ref 0.9–3.3)

## 2008-07-19 LAB — IRON AND TIBC
%SAT: 16 % — ABNORMAL LOW (ref 20–55)
Iron: 47 ug/dL (ref 42–145)
TIBC: 299 ug/dL (ref 250–470)
UIBC: 252 ug/dL

## 2008-07-27 ENCOUNTER — Ambulatory Visit: Payer: Self-pay | Admitting: Oncology

## 2008-08-01 LAB — CBC WITH DIFFERENTIAL/PLATELET
BASO%: 0.4 % (ref 0.0–2.0)
Basophils Absolute: 0 10*3/uL (ref 0.0–0.1)
EOS%: 3.9 % (ref 0.0–7.0)
HCT: 36.2 % (ref 34.8–46.6)
HGB: 11.4 g/dL — ABNORMAL LOW (ref 11.6–15.9)
LYMPH%: 28.2 % (ref 14.0–49.7)
MCH: 26.1 pg (ref 25.1–34.0)
MCHC: 31.5 g/dL (ref 31.5–36.0)
MONO#: 0.4 10*3/uL (ref 0.1–0.9)
NEUT%: 59.9 % (ref 38.4–76.8)
Platelets: 193 10*3/uL (ref 145–400)

## 2008-08-25 ENCOUNTER — Ambulatory Visit: Payer: Self-pay | Admitting: Oncology

## 2008-08-29 LAB — CBC WITH DIFFERENTIAL/PLATELET
BASO%: 0.3 % (ref 0.0–2.0)
EOS%: 5.9 % (ref 0.0–7.0)
HCT: 34.4 % — ABNORMAL LOW (ref 34.8–46.6)
LYMPH%: 31.2 % (ref 14.0–49.7)
MCH: 26.2 pg (ref 25.1–34.0)
MCHC: 31.4 g/dL — ABNORMAL LOW (ref 31.5–36.0)
MONO%: 5.9 % (ref 0.0–14.0)
NEUT%: 56.7 % (ref 38.4–76.8)
Platelets: 183 10*3/uL (ref 145–400)
RBC: 4.12 10*6/uL (ref 3.70–5.45)
WBC: 5.9 10*3/uL (ref 3.9–10.3)

## 2008-09-26 ENCOUNTER — Ambulatory Visit: Payer: Self-pay | Admitting: Oncology

## 2008-09-26 LAB — CBC WITH DIFFERENTIAL/PLATELET
Basophils Absolute: 0 10*3/uL (ref 0.0–0.1)
EOS%: 5.9 % (ref 0.0–7.0)
Eosinophils Absolute: 0.4 10*3/uL (ref 0.0–0.5)
HGB: 11.1 g/dL — ABNORMAL LOW (ref 11.6–15.9)
LYMPH%: 29.5 % (ref 14.0–49.7)
MCH: 26.6 pg (ref 25.1–34.0)
MCV: 84.2 fL (ref 79.5–101.0)
MONO%: 6.6 % (ref 0.0–14.0)
NEUT#: 3.4 10*3/uL (ref 1.5–6.5)
Platelets: 188 10*3/uL (ref 145–400)
RBC: 4.17 10*6/uL (ref 3.70–5.45)
RDW: 16.5 % — ABNORMAL HIGH (ref 11.2–14.5)

## 2008-10-24 ENCOUNTER — Ambulatory Visit: Payer: Self-pay | Admitting: Oncology

## 2008-10-24 LAB — CBC WITH DIFFERENTIAL/PLATELET
Basophils Absolute: 0.1 10*3/uL (ref 0.0–0.1)
EOS%: 7.8 % — ABNORMAL HIGH (ref 0.0–7.0)
Eosinophils Absolute: 0.5 10*3/uL (ref 0.0–0.5)
HCT: 36.6 % (ref 34.8–46.6)
HGB: 11.4 g/dL — ABNORMAL LOW (ref 11.6–15.9)
MCH: 26.8 pg (ref 25.1–34.0)
MCV: 86.1 fL (ref 79.5–101.0)
MONO%: 6.6 % (ref 0.0–14.0)
NEUT#: 3.5 10*3/uL (ref 1.5–6.5)
NEUT%: 55.3 % (ref 38.4–76.8)

## 2008-11-07 LAB — CBC WITH DIFFERENTIAL/PLATELET
Basophils Absolute: 0 10*3/uL (ref 0.0–0.1)
Eosinophils Absolute: 0.4 10*3/uL (ref 0.0–0.5)
HCT: 32.6 % — ABNORMAL LOW (ref 34.8–46.6)
LYMPH%: 29.9 % (ref 14.0–49.7)
MCV: 84.8 fL (ref 79.5–101.0)
MONO%: 6 % (ref 0.0–14.0)
NEUT#: 3.1 10*3/uL (ref 1.5–6.5)
NEUT%: 56.9 % (ref 38.4–76.8)
Platelets: 178 10*3/uL (ref 145–400)
RBC: 3.84 10*6/uL (ref 3.70–5.45)

## 2008-11-07 LAB — IRON AND TIBC
%SAT: 21 % (ref 20–55)
Iron: 60 ug/dL (ref 42–145)
TIBC: 286 ug/dL (ref 250–470)
UIBC: 226 ug/dL

## 2008-11-07 LAB — FERRITIN: Ferritin: 324 ng/mL — ABNORMAL HIGH (ref 10–291)

## 2008-11-17 ENCOUNTER — Ambulatory Visit: Payer: Self-pay | Admitting: Oncology

## 2008-11-21 LAB — CBC WITH DIFFERENTIAL/PLATELET
Basophils Absolute: 0 10*3/uL (ref 0.0–0.1)
Eosinophils Absolute: 0.5 10*3/uL (ref 0.0–0.5)
HCT: 32.4 % — ABNORMAL LOW (ref 34.8–46.6)
HGB: 10.6 g/dL — ABNORMAL LOW (ref 11.6–15.9)
MONO#: 0.2 10*3/uL (ref 0.1–0.9)
NEUT#: 3.5 10*3/uL (ref 1.5–6.5)
NEUT%: 60.6 % (ref 38.4–76.8)
RDW: 15.7 % — ABNORMAL HIGH (ref 11.2–14.5)
WBC: 5.9 10*3/uL (ref 3.9–10.3)
lymph#: 1.5 10*3/uL (ref 0.9–3.3)

## 2008-11-22 ENCOUNTER — Encounter (INDEPENDENT_AMBULATORY_CARE_PROVIDER_SITE_OTHER): Payer: Self-pay | Admitting: *Deleted

## 2008-12-15 ENCOUNTER — Ambulatory Visit: Payer: Self-pay | Admitting: Oncology

## 2008-12-19 LAB — CBC WITH DIFFERENTIAL/PLATELET
Basophils Absolute: 0 10*3/uL (ref 0.0–0.1)
Eosinophils Absolute: 0.3 10*3/uL (ref 0.0–0.5)
HGB: 10.6 g/dL — ABNORMAL LOW (ref 11.6–15.9)
LYMPH%: 31 % (ref 14.0–49.7)
MCV: 87 fL (ref 79.5–101.0)
MONO#: 0.4 10*3/uL (ref 0.1–0.9)
MONO%: 6.9 % (ref 0.0–14.0)
NEUT#: 3.1 10*3/uL (ref 1.5–6.5)
Platelets: 196 10*3/uL (ref 145–400)
RDW: 15.1 % — ABNORMAL HIGH (ref 11.2–14.5)

## 2009-01-11 ENCOUNTER — Encounter: Payer: Self-pay | Admitting: Cardiology

## 2009-01-17 ENCOUNTER — Ambulatory Visit: Payer: Self-pay | Admitting: Oncology

## 2009-01-19 LAB — CBC WITH DIFFERENTIAL/PLATELET
LYMPH%: 9.4 % — ABNORMAL LOW (ref 14.0–49.7)
MCH: 26.7 pg (ref 25.1–34.0)
MCHC: 31.4 g/dL — ABNORMAL LOW (ref 31.5–36.0)
MONO#: 0.4 10*3/uL (ref 0.1–0.9)
MONO%: 3.5 % (ref 0.0–14.0)
RBC: 4.3 10*6/uL (ref 3.70–5.45)
RDW: 15.4 % — ABNORMAL HIGH (ref 11.2–14.5)
WBC: 11.4 10*3/uL — ABNORMAL HIGH (ref 3.9–10.3)
lymph#: 1.1 10*3/uL (ref 0.9–3.3)

## 2009-02-08 ENCOUNTER — Encounter: Payer: Self-pay | Admitting: Cardiology

## 2009-02-16 ENCOUNTER — Ambulatory Visit: Payer: Self-pay | Admitting: Oncology

## 2009-02-16 LAB — CBC WITH DIFFERENTIAL/PLATELET
Eosinophils Absolute: 0.3 10*3/uL (ref 0.0–0.5)
MONO#: 0.4 10*3/uL (ref 0.1–0.9)
NEUT#: 2.8 10*3/uL (ref 1.5–6.5)
Platelets: 241 10*3/uL (ref 145–400)
RBC: 4.1 10*6/uL (ref 3.70–5.45)
RDW: 15.7 % — ABNORMAL HIGH (ref 11.2–14.5)
WBC: 5.5 10*3/uL (ref 3.9–10.3)

## 2009-02-23 DIAGNOSIS — E1165 Type 2 diabetes mellitus with hyperglycemia: Secondary | ICD-10-CM

## 2009-02-23 DIAGNOSIS — N259 Disorder resulting from impaired renal tubular function, unspecified: Secondary | ICD-10-CM | POA: Insufficient documentation

## 2009-02-23 DIAGNOSIS — G4730 Sleep apnea, unspecified: Secondary | ICD-10-CM | POA: Insufficient documentation

## 2009-02-23 DIAGNOSIS — I6529 Occlusion and stenosis of unspecified carotid artery: Secondary | ICD-10-CM

## 2009-02-23 DIAGNOSIS — D649 Anemia, unspecified: Secondary | ICD-10-CM

## 2009-02-23 DIAGNOSIS — J449 Chronic obstructive pulmonary disease, unspecified: Secondary | ICD-10-CM | POA: Insufficient documentation

## 2009-02-23 DIAGNOSIS — I10 Essential (primary) hypertension: Secondary | ICD-10-CM | POA: Insufficient documentation

## 2009-02-23 DIAGNOSIS — E785 Hyperlipidemia, unspecified: Secondary | ICD-10-CM

## 2009-02-27 ENCOUNTER — Ambulatory Visit: Payer: Self-pay | Admitting: Cardiology

## 2009-02-27 DIAGNOSIS — I359 Nonrheumatic aortic valve disorder, unspecified: Secondary | ICD-10-CM | POA: Insufficient documentation

## 2009-02-27 LAB — CBC WITH DIFFERENTIAL/PLATELET
Eosinophils Absolute: 0.2 10*3/uL (ref 0.0–0.5)
HCT: 33.8 % — ABNORMAL LOW (ref 34.8–46.6)
HGB: 11.1 g/dL — ABNORMAL LOW (ref 11.6–15.9)
LYMPH%: 28.5 % (ref 14.0–49.7)
MONO#: 0.3 10*3/uL (ref 0.1–0.9)
NEUT#: 3.2 10*3/uL (ref 1.5–6.5)
NEUT%: 61.2 % (ref 38.4–76.8)
Platelets: 193 10*3/uL (ref 145–400)
WBC: 5.2 10*3/uL (ref 3.9–10.3)

## 2009-02-27 LAB — IRON AND TIBC
%SAT: 14 % — ABNORMAL LOW (ref 20–55)
TIBC: 287 ug/dL (ref 250–470)

## 2009-02-27 LAB — FERRITIN: Ferritin: 316 ng/mL — ABNORMAL HIGH (ref 10–291)

## 2009-03-13 LAB — CBC WITH DIFFERENTIAL/PLATELET
BASO%: 0.5 % (ref 0.0–2.0)
EOS%: 7.3 % — ABNORMAL HIGH (ref 0.0–7.0)
HCT: 37.2 % (ref 34.8–46.6)
MCH: 27 pg (ref 25.1–34.0)
MCHC: 31.2 g/dL — ABNORMAL LOW (ref 31.5–36.0)
NEUT%: 53.4 % (ref 38.4–76.8)
RBC: 4.29 10*6/uL (ref 3.70–5.45)
RDW: 15.9 % — ABNORMAL HIGH (ref 11.2–14.5)
lymph#: 1.9 10*3/uL (ref 0.9–3.3)
nRBC: 0 % (ref 0–0)

## 2009-03-23 ENCOUNTER — Ambulatory Visit: Payer: Self-pay | Admitting: Oncology

## 2009-03-27 LAB — CBC WITH DIFFERENTIAL/PLATELET
EOS%: 5 % (ref 0.0–7.0)
Eosinophils Absolute: 0.3 10*3/uL (ref 0.0–0.5)
MCV: 86.6 fL (ref 79.5–101.0)
MONO%: 5.6 % (ref 0.0–14.0)
NEUT#: 3.1 10*3/uL (ref 1.5–6.5)
RBC: 4.09 10*6/uL (ref 3.70–5.45)
RDW: 15.7 % — ABNORMAL HIGH (ref 11.2–14.5)
lymph#: 1.7 10*3/uL (ref 0.9–3.3)

## 2009-04-24 ENCOUNTER — Ambulatory Visit: Payer: Self-pay | Admitting: Oncology

## 2009-04-24 LAB — CBC WITH DIFFERENTIAL/PLATELET
BASO%: 0.3 % (ref 0.0–2.0)
EOS%: 5.2 % (ref 0.0–7.0)
HCT: 35.4 % (ref 34.8–46.6)
LYMPH%: 33.3 % (ref 14.0–49.7)
MCH: 27 pg (ref 25.1–34.0)
MCHC: 31.1 g/dL — ABNORMAL LOW (ref 31.5–36.0)
MCV: 87 fL (ref 79.5–101.0)
MONO%: 6.7 % (ref 0.0–14.0)
NEUT%: 54.5 % (ref 38.4–76.8)
Platelets: 208 10*3/uL (ref 145–400)
RBC: 4.07 10*6/uL (ref 3.70–5.45)

## 2009-05-22 LAB — CBC WITH DIFFERENTIAL/PLATELET
Eosinophils Absolute: 0.3 10*3/uL (ref 0.0–0.5)
HCT: 36.3 % (ref 34.8–46.6)
LYMPH%: 30.9 % (ref 14.0–49.7)
MCV: 87.1 fL (ref 79.5–101.0)
MONO#: 0.4 10*3/uL (ref 0.1–0.9)
MONO%: 7.1 % (ref 0.0–14.0)
NEUT#: 3.2 10*3/uL (ref 1.5–6.5)
NEUT%: 56.3 % (ref 38.4–76.8)
Platelets: 213 10*3/uL (ref 145–400)
WBC: 5.6 10*3/uL (ref 3.9–10.3)

## 2009-06-15 ENCOUNTER — Ambulatory Visit: Payer: Self-pay | Admitting: Oncology

## 2009-06-19 LAB — CBC WITH DIFFERENTIAL/PLATELET
Basophils Absolute: 0 10*3/uL (ref 0.0–0.1)
Eosinophils Absolute: 0.4 10*3/uL (ref 0.0–0.5)
HGB: 10.9 g/dL — ABNORMAL LOW (ref 11.6–15.9)
LYMPH%: 29.3 % (ref 14.0–49.7)
MCV: 86.6 fL (ref 79.5–101.0)
MONO#: 0.3 10*3/uL (ref 0.1–0.9)
MONO%: 5.3 % (ref 0.0–14.0)
NEUT#: 3.4 10*3/uL (ref 1.5–6.5)
Platelets: 203 10*3/uL (ref 145–400)
RBC: 4.09 10*6/uL (ref 3.70–5.45)
WBC: 5.7 10*3/uL (ref 3.9–10.3)

## 2009-06-27 ENCOUNTER — Encounter: Payer: Self-pay | Admitting: Cardiology

## 2009-07-19 ENCOUNTER — Ambulatory Visit: Payer: Self-pay | Admitting: Oncology

## 2009-07-23 LAB — CBC WITH DIFFERENTIAL/PLATELET
Basophils Absolute: 0 10*3/uL (ref 0.0–0.1)
Eosinophils Absolute: 0.3 10*3/uL (ref 0.0–0.5)
HGB: 10.7 g/dL — ABNORMAL LOW (ref 11.6–15.9)
MONO#: 0.4 10*3/uL (ref 0.1–0.9)
MONO%: 6.8 % (ref 0.0–14.0)
NEUT#: 3.3 10*3/uL (ref 1.5–6.5)
RBC: 4.01 10*6/uL (ref 3.70–5.45)
RDW: 15.5 % — ABNORMAL HIGH (ref 11.2–14.5)
WBC: 5.8 10*3/uL (ref 3.9–10.3)
lymph#: 1.8 10*3/uL (ref 0.9–3.3)

## 2009-08-01 ENCOUNTER — Encounter: Payer: Self-pay | Admitting: Cardiology

## 2009-08-21 ENCOUNTER — Ambulatory Visit: Payer: Self-pay | Admitting: Oncology

## 2009-08-21 LAB — CBC WITH DIFFERENTIAL/PLATELET
BASO%: 0.4 % (ref 0.0–2.0)
EOS%: 7.6 % — ABNORMAL HIGH (ref 0.0–7.0)
HCT: 36.1 % (ref 34.8–46.6)
MCH: 26.5 pg (ref 25.1–34.0)
MCHC: 30.7 g/dL — ABNORMAL LOW (ref 31.5–36.0)
MONO#: 0.5 10*3/uL (ref 0.1–0.9)
NEUT%: 55.7 % (ref 38.4–76.8)
RBC: 4.19 10*6/uL (ref 3.70–5.45)
RDW: 15.8 % — ABNORMAL HIGH (ref 11.2–14.5)
WBC: 6.8 10*3/uL (ref 3.9–10.3)
lymph#: 2 10*3/uL (ref 0.9–3.3)
nRBC: 0 % (ref 0–0)

## 2009-09-18 LAB — CBC WITH DIFFERENTIAL/PLATELET
BASO%: 0.2 % (ref 0.0–2.0)
Eosinophils Absolute: 0.4 10*3/uL (ref 0.0–0.5)
MCHC: 31 g/dL — ABNORMAL LOW (ref 31.5–36.0)
MCV: 86.3 fL (ref 79.5–101.0)
MONO%: 6 % (ref 0.0–14.0)
NEUT#: 3.8 10*3/uL (ref 1.5–6.5)
RBC: 3.93 10*6/uL (ref 3.70–5.45)
RDW: 15.9 % — ABNORMAL HIGH (ref 11.2–14.5)
WBC: 6.2 10*3/uL (ref 3.9–10.3)
nRBC: 0 % (ref 0–0)

## 2009-10-12 ENCOUNTER — Ambulatory Visit: Payer: Self-pay | Admitting: Oncology

## 2009-10-19 LAB — CBC WITH DIFFERENTIAL/PLATELET
BASO%: 0.2 % (ref 0.0–2.0)
Basophils Absolute: 0 10*3/uL (ref 0.0–0.1)
EOS%: 5.9 % (ref 0.0–7.0)
HCT: 34.6 % — ABNORMAL LOW (ref 34.8–46.6)
LYMPH%: 27.7 % (ref 14.0–49.7)
MCH: 26.8 pg (ref 25.1–34.0)
MCHC: 30.6 g/dL — ABNORMAL LOW (ref 31.5–36.0)
MONO#: 0.3 10*3/uL (ref 0.1–0.9)
NEUT%: 60.5 % (ref 38.4–76.8)
Platelets: 192 10*3/uL (ref 145–400)

## 2009-11-13 ENCOUNTER — Other Ambulatory Visit: Payer: Self-pay | Admitting: Oncology

## 2009-11-13 LAB — IRON AND TIBC
%SAT: 21 % (ref 20–55)
Iron: 59 ug/dL (ref 42–145)

## 2009-11-13 LAB — FERRITIN: Ferritin: 291 ng/mL (ref 10–291)

## 2009-11-13 LAB — CBC WITH DIFFERENTIAL/PLATELET
Eosinophils Absolute: 0.3 10*3/uL (ref 0.0–0.5)
HCT: 32.3 % — ABNORMAL LOW (ref 34.8–46.6)
LYMPH%: 28.6 % (ref 14.0–49.7)
MONO#: 0.3 10*3/uL (ref 0.1–0.9)
NEUT#: 3.1 10*3/uL (ref 1.5–6.5)
Platelets: 211 10*3/uL (ref 145–400)
RBC: 3.81 10*6/uL (ref 3.70–5.45)
WBC: 5.4 10*3/uL (ref 3.9–10.3)

## 2009-12-11 ENCOUNTER — Other Ambulatory Visit: Payer: Self-pay | Admitting: Oncology

## 2009-12-11 LAB — CBC WITH DIFFERENTIAL/PLATELET
BASO%: 0.3 % (ref 0.0–2.0)
Basophils Absolute: 0 10*3/uL (ref 0.0–0.1)
EOS%: 6.3 % (ref 0.0–7.0)
HGB: 11.3 g/dL — ABNORMAL LOW (ref 11.6–15.9)
MCH: 27 pg (ref 25.1–34.0)
MCHC: 31.6 g/dL (ref 31.5–36.0)
RDW: 14.9 % — ABNORMAL HIGH (ref 11.2–14.5)
lymph#: 1.7 10*3/uL (ref 0.9–3.3)

## 2010-01-02 ENCOUNTER — Ambulatory Visit: Payer: Self-pay | Admitting: Oncology

## 2010-01-08 ENCOUNTER — Ambulatory Visit: Payer: Self-pay | Admitting: Oncology

## 2010-01-08 LAB — CBC WITH DIFFERENTIAL/PLATELET
BASO%: 0.2 % (ref 0.0–2.0)
EOS%: 5.5 % (ref 0.0–7.0)
HCT: 30.1 % — ABNORMAL LOW (ref 34.8–46.6)
LYMPH%: 23.3 % (ref 14.0–49.7)
MCH: 26.9 pg (ref 25.1–34.0)
MCHC: 31.6 g/dL (ref 31.5–36.0)
MONO#: 0.4 10*3/uL (ref 0.1–0.9)
MONO%: 6.6 % (ref 0.0–14.0)
NEUT%: 64.4 % (ref 38.4–76.8)
Platelets: 177 10*3/uL (ref 145–400)
RBC: 3.53 10*6/uL — ABNORMAL LOW (ref 3.70–5.45)
WBC: 6.4 10*3/uL (ref 3.9–10.3)

## 2010-01-23 ENCOUNTER — Encounter
Admission: RE | Admit: 2010-01-23 | Discharge: 2010-02-05 | Payer: Self-pay | Source: Home / Self Care | Attending: Family Medicine | Admitting: Family Medicine

## 2010-01-25 ENCOUNTER — Encounter: Payer: Self-pay | Admitting: Cardiology

## 2010-01-27 ENCOUNTER — Encounter: Payer: Self-pay | Admitting: Family Medicine

## 2010-01-30 ENCOUNTER — Encounter: Payer: Self-pay | Admitting: Cardiology

## 2010-02-05 ENCOUNTER — Inpatient Hospital Stay (HOSPITAL_COMMUNITY)
Admission: EM | Admit: 2010-02-05 | Discharge: 2010-02-08 | DRG: 191 | Disposition: A | Discharge status: Home / Self Care | Payer: Medicare Other | Attending: Family Medicine | Admitting: Family Medicine

## 2010-02-05 DIAGNOSIS — I5030 Unspecified diastolic (congestive) heart failure: Secondary | ICD-10-CM | POA: Diagnosis present

## 2010-02-05 DIAGNOSIS — J440 Chronic obstructive pulmonary disease with acute lower respiratory infection: Principal | ICD-10-CM | POA: Diagnosis present

## 2010-02-05 DIAGNOSIS — I509 Heart failure, unspecified: Secondary | ICD-10-CM | POA: Diagnosis present

## 2010-02-05 DIAGNOSIS — J209 Acute bronchitis, unspecified: Principal | ICD-10-CM | POA: Diagnosis present

## 2010-02-05 DIAGNOSIS — I129 Hypertensive chronic kidney disease with stage 1 through stage 4 chronic kidney disease, or unspecified chronic kidney disease: Secondary | ICD-10-CM | POA: Diagnosis present

## 2010-02-05 DIAGNOSIS — D638 Anemia in other chronic diseases classified elsewhere: Secondary | ICD-10-CM | POA: Diagnosis present

## 2010-02-05 DIAGNOSIS — M109 Gout, unspecified: Secondary | ICD-10-CM | POA: Diagnosis present

## 2010-02-05 DIAGNOSIS — G4733 Obstructive sleep apnea (adult) (pediatric): Secondary | ICD-10-CM | POA: Diagnosis present

## 2010-02-05 DIAGNOSIS — Z7982 Long term (current) use of aspirin: Secondary | ICD-10-CM

## 2010-02-05 DIAGNOSIS — J441 Chronic obstructive pulmonary disease with (acute) exacerbation: Secondary | ICD-10-CM | POA: Diagnosis present

## 2010-02-05 DIAGNOSIS — E119 Type 2 diabetes mellitus without complications: Secondary | ICD-10-CM | POA: Diagnosis present

## 2010-02-05 DIAGNOSIS — N189 Chronic kidney disease, unspecified: Secondary | ICD-10-CM | POA: Diagnosis present

## 2010-02-05 LAB — CBC WITH DIFFERENTIAL/PLATELET
BASO%: 0.3 % (ref 0.0–2.0)
EOS%: 5.6 % (ref 0.0–7.0)
HCT: 31.2 % — ABNORMAL LOW (ref 34.8–46.6)
LYMPH%: 25 % (ref 14.0–49.7)
MCH: 27.1 pg (ref 25.1–34.0)
MCHC: 32.5 g/dL (ref 31.5–36.0)
MCV: 83.4 fL (ref 79.5–101.0)
MONO%: 4.1 % (ref 0.0–14.0)
NEUT%: 65 % (ref 38.4–76.8)
lymph#: 1.7 10*3/uL (ref 0.9–3.3)

## 2010-02-05 LAB — POCT CARDIAC MARKERS
CKMB, poc: <1 ng/mL — ABNORMAL LOW (ref 1.0–8.0)
Troponin i, poc: <0.05 ng/mL (ref 0.00–0.09)

## 2010-02-05 LAB — CK TOTAL AND CKMB (NOT AT ARMC)
CK, MB: 1.4 ng/mL (ref 0.3–4.0)
Relative Index: 0.9 (ref 0.0–2.5)

## 2010-02-05 LAB — POCT I-STAT, CHEM 8
Calcium, Ion: 1.13 mmol/L (ref 1.12–1.32)
Chloride: 103 mEq/L (ref 96–112)
Glucose, Bld: 136 mg/dL — ABNORMAL HIGH (ref 70–99)
HCT: 35 % — ABNORMAL LOW (ref 36.0–46.0)
Hemoglobin: 11.9 g/dL — ABNORMAL LOW (ref 12.0–15.0)
TCO2: 33 mmol/L (ref 0–100)

## 2010-02-05 LAB — CBC
Platelets: 220 10*3/uL (ref 150–400)
RBC: 4.15 MIL/uL (ref 3.87–5.11)
RDW: 15.3 % (ref 11.5–15.5)
WBC: 6.9 10*3/uL (ref 4.0–10.5)

## 2010-02-05 LAB — GLUCOSE, CAPILLARY: Glucose-Capillary: 299 mg/dL — ABNORMAL HIGH (ref 70–99)

## 2010-02-05 LAB — POCT I-STAT 3, ART BLOOD GAS (G3+)
pCO2 arterial: 37 mmHg (ref 35.0–45.0)
pH, Arterial: 7.558 — ABNORMAL HIGH (ref 7.350–7.400)
pO2, Arterial: 87 mmHg (ref 80.0–100.0)

## 2010-02-05 LAB — DIFFERENTIAL
Basophils Absolute: 0 10*3/uL (ref 0.0–0.1)
Basophils Relative: 0 % (ref 0–1)
Eosinophils Absolute: 0.4 10*3/uL (ref 0.0–0.7)
Eosinophils Relative: 6 % — ABNORMAL HIGH (ref 0–5)
Neutrophils Relative %: 63 % (ref 43–77)

## 2010-02-05 LAB — BRAIN NATRIURETIC PEPTIDE: Pro B Natriuretic peptide (BNP): <30 pg/mL (ref 0.0–100.0)

## 2010-02-05 NOTE — H&P (Signed)
[name], [name]               ACCOUNT NO.:  [id number]  MEDICAL RECORD NO.:  [id number]          PATIENT TYPE:  EMS  LOCATION:  MAJO                         FACILITY:  [facility]  PHYSICIAN:  Talmage Nap, MD  DATE OF BIRTH:  11/02/1932  DATE OF ADMISSION:  02/05/2010 DATE OF DISCHARGE:                             HISTORY & PHYSICAL   PRIMARY CARE PHYSICIAN:  Sigmund Hazel, MD, [facility].  CARDIOLOGIST:  Rollene Rotunda, MD, [facility]  CHIEF COMPLAINT:  Shortness of breath of about 2- to 3-week duration.  HISTORY OF PRESENT ILLNESS:  The patient is a 77 year old African American female, morbidly obese with a history of asthma on chronic home O2 use as well as obstructive sleep apnea on CPAP presenting to the emergency room with shortness of breath of about 2-3week duration and this was said to be getting progressively worse.  The patient, however, denied any history of cough.  She denied any fever.  She denied any chills.  She denied any rigor.  She claims she had a vague right-sided chest pain.  She denied any nausea or vomiting.  The patient claims she gets short of breath walking less than half a block.  She also denied any history of PND or orthopnea.  She also complained about progressive swelling of the lower extremity.  She denied any history of a syncopal episode.  She denied any systemic symptoms.  No fever, no chills, and no rigor but shortness of breath was getting progressively worse, hence the patient presented to the emergency room to be evaluated.  PAST MEDICAL HISTORY: 1. Positive for asthma. 2. Morbid obesity. 3. Obstructive sleep apnea. 4. Diabetes mellitus. 5. Hypertension. 6. Chronic kidney disease. 7. Anemia of chronic disorder. 8. Gout.  PAST SURGICAL HISTORY: 1. Cholecystectomy. 2. Colonoscopy, the findings is that of multiple polyps. 3. Breast reduction surgery.  PREADMISSION MEDICATIONS WITHOUT DOSES: 1. Actos. 2. Advair Diskus. 3.  Albuterol inhaler. 4. Allopurinol. 5. Antara. 6. Aspirin. 7. Calcitriol. 8. Colace. 9. Cozaar. 10.Diltiazem hydrochloride. 11.Ferrous sulfate. 12.Lantus insulin. 13.MiraLax. 14.NovoLog. 15.Omeprazole. 16.Pravastatin. 17.Procrit. 18.Singulair.  ALLERGIES:  CODEINE.  SOCIAL HISTORY:  Negative for alcohol and tobacco use.  FAMILY HISTORY:  Positive for coronary artery disease and hypertension.  REVIEW OF SYSTEMS:  The patient denies any history of headaches.  No blurred vision.  No nausea or vomiting.  No fever, no chills, and no rigor.  Complained of increasing shortness of breath with associated right-sided chest discomfort.  Denies any PND or orthopnea.  No cough. No abdominal discomfort.  No diarrhea or hematochezia.  No dysuria or hematuria.  Has progressive swelling of the lower extremity.  No intolerance to heat or cold.  No neuropsychiatric disorder.  PHYSICAL EXAMINATION:  GENERAL:  Elderly lady in respiratory distress, i.e., not able to complete a sentence. VITAL SIGNS:  Blood pressure is 140/64, pulse is 70, respiratory rate is 16, and temperature is 98.5. HEENT:  Pallor but pupils are reactive to light and extraocular muscles are intact. NECK:  Increase neck circumference. CHEST:  Decreased breath sounds globally with minimal rales bibasilarly. HEART:  Sounds are 1 and 2. ABDOMEN:  Obese  and nontender.  Liver, spleen, and kidney not palpable. Bowel sounds positive. EXTREMITIES:  +1 pedal edema. NEUROLOGIC:  Nonfocal. MUSCULOSKELETAL SYSTEM:  Arthritic changes in the knees and feet. NEUROPSYCHIATRIC:  Unremarkable. SKIN:  Normal turgor.  LABORATORY DATA:  Initial chemistry done on the patient's showed a sodium of 141, potassium of 3.7, chloride of 106 with a BUN of 66, creatinine is 2.3, and glucose is 136.  Hematologic indices showed a WBC of 6.9, hemoglobin of 10.9, hematocrit of 35.1, MCV of 84.3, and platelet count of 280 with normal differential.   First set of cardiac enzymes troponin-I less than 0.05.  Urinalysis unremarkable.  Arterial blood gas done showed pH of 7.558, pCO2 of 37, pO2 of 87, and bicarb of 33.  D-dimer is 0.78.  EKG showed normal sinus rhythm with PVCs with right bundle-branch block and a rate of 67.  Chest x-ray showed cardiomegaly.  There is no active cardiopulmonary process seen.  ADMITTING IMPRESSION: 1. Increasing shortness of breath, etiology is      multifactorial, however, differentials will include:     a.     Pulmonary embolism.     b.     Acute coronary syndrome.     c.     Cor pulmonale.     d.     Pulmonary hypertension. 2. Morbid obesity. 3. Asthma. 4. Obstructive sleep apnea. 5. Diabetes mellitus. 6. Hypertension (systemic). 7. Chronic kidney disease. 8. Anemia of chronic disorder. 9. Gout.  PLAN:  Admit the patient to telemetry.  The patient will be on O2 via nasal cannula 3 times per minute.  She will be given CPAP at night, titrated.  She will be on albuterol and Atrovent nebs q.6 hourly and Rocephin 1 gram IV q.24.  Other medications to be given to the patient will include Lasix 40 mg IV q.12.  She will also be on Accu-Cheks t.i.d. with before meals and at bedtime with regular insulin sliding scale (moderate scale), Cozaar 50 mg p.o. daily, and Lopressor 25 mg p.o. b.i.d.  She will also be given Protonix 40 mg IV q.24 for GI prophylaxis and Lovenox 40 mg subcu q.24 for DVT prophylaxis.  Other labs to be ordered on the patient will include cardiac enzymes q.6 x3, blood culture x2 before starting IV antibiotics, 2-D echo, V/Q scan, CBC, CMP, and magnesium repeated in a.m.  The patient will also have hemoglobin A1c done.  The patient will be followed and evaluated on daily basis.     Talmage Nap, MD     CN/MEDQ  D:  02/05/2010  T:  02/05/2010  Job:  [id number]  Electronically Signed by Talmage Nap  on 02/05/2010 11:11:25 PM

## 2010-02-06 DIAGNOSIS — I369 Nonrheumatic tricuspid valve disorder, unspecified: Secondary | ICD-10-CM

## 2010-02-06 LAB — COMPREHENSIVE METABOLIC PANEL
AST: 18 U/L (ref 0–37)
Albumin: 3.6 g/dL (ref 3.5–5.2)
Alkaline Phosphatase: 65 U/L (ref 39–117)
BUN: 51 mg/dL — ABNORMAL HIGH (ref 6–23)
GFR calc Af Amer: 30 mL/min — ABNORMAL LOW (ref >60)
Potassium: 3.8 mEq/L (ref 3.5–5.1)
Total Protein: 7.3 g/dL (ref 6.0–8.3)

## 2010-02-06 LAB — DIFFERENTIAL
Eosinophils Absolute: 0.3 10*3/uL (ref 0.0–0.7)
Lymphocytes Relative: 26 % (ref 12–46)
Lymphs Abs: 1.5 10*3/uL (ref 0.7–4.0)
Monocytes Relative: 7 % (ref 3–12)
Neutro Abs: 3.6 10*3/uL (ref 1.7–7.7)
Neutrophils Relative %: 62 % (ref 43–77)

## 2010-02-06 LAB — HEMOGLOBIN A1C
Hgb A1c MFr Bld: 5.9 % — ABNORMAL HIGH (ref <5.7)
Mean Plasma Glucose: 123 mg/dL — ABNORMAL HIGH (ref <117)
Mean Plasma Glucose: 131 mg/dL — ABNORMAL HIGH (ref <117)

## 2010-02-06 LAB — CARDIAC PANEL(CRET KIN+CKTOT+MB+TROPI)
CK, MB: 1.3 ng/mL (ref 0.3–4.0)
Relative Index: 0.9 (ref 0.0–2.5)

## 2010-02-06 LAB — CBC
HCT: 33.9 % — ABNORMAL LOW (ref 36.0–46.0)
Hemoglobin: 10.4 g/dL — ABNORMAL LOW (ref 12.0–15.0)
MCV: 86.9 fL (ref 78.0–100.0)
Platelets: 203 10*3/uL (ref 150–400)
RBC: 3.9 MIL/uL (ref 3.87–5.11)
WBC: 5.8 10*3/uL (ref 4.0–10.5)

## 2010-02-06 LAB — GLUCOSE, CAPILLARY
Glucose-Capillary: 142 mg/dL — ABNORMAL HIGH (ref 70–99)
Glucose-Capillary: 171 mg/dL — ABNORMAL HIGH (ref 70–99)

## 2010-02-07 ENCOUNTER — Inpatient Hospital Stay (HOSPITAL_COMMUNITY): Payer: Medicare Other

## 2010-02-07 LAB — BASIC METABOLIC PANEL
BUN: 44 mg/dL — ABNORMAL HIGH (ref 6–23)
CO2: 30 mEq/L (ref 19–32)
Chloride: 100 mEq/L (ref 96–112)
Glucose, Bld: 194 mg/dL — ABNORMAL HIGH (ref 70–99)
Potassium: 3.8 mEq/L (ref 3.5–5.1)

## 2010-02-07 LAB — GLUCOSE, CAPILLARY: Glucose-Capillary: 251 mg/dL — ABNORMAL HIGH (ref 70–99)

## 2010-02-07 MED ORDER — XENON XE 133 GAS
10.0000 | GAS_FOR_INHALATION | Freq: Once | RESPIRATORY_TRACT | Status: AC | PRN
  Administered 2010-02-07: 10 via RESPIRATORY_TRACT

## 2010-02-07 MED ORDER — TECHNETIUM TO 99M ALBUMIN AGGREGATED
6.0000 | Freq: Once | INTRAVENOUS | Status: AC | PRN
  Administered 2010-02-07: 6 via INTRAVENOUS

## 2010-02-07 NOTE — Assessment & Plan Note (Signed)
Summary: 1 YR F/U  Medications Added LOSARTAN POTASSIUM 100 MG TABS (LOSARTAN POTASSIUM) 1 by mouth daily FUROSEMIDE 80 MG TABS (FUROSEMIDE) 1 by mouth daily ACTOS 15 MG TABS (PIOGLITAZONE HCL) 1 by mouth daily OMEPRAZOLE 20 MG CPDR (OMEPRAZOLE) 1 by mouth daily ASPIRIN 81 MG  TABS (ASPIRIN) 1 by mouth daily ADVAIR DISKUS 250-50 MCG/DOSE AEPB (FLUTICASONE-SALMETEROL) as directed SINGULAIR 10 MG TABS (MONTELUKAST SODIUM) 1 by mouth daily PROVENTIL HFA 108 (90 BASE) MCG/ACT AERS (ALBUTEROL SULFATE) as directed LANTUS 100 UNIT/ML SOLN (INSULIN GLARGINE) as directed NOVOLOG MIX 70/30 FLEXPEN 70-30 % SUSP (INSULIN ASPART PROT & ASPART) as directed FERROUS GLUCONATE 325 MG TABS (FERROUS GLUCONATE) 1 by mouth three times a day DOCUSATE SODIUM 100 MG CAPS (DOCUSATE SODIUM) 1 by mouth two times a day as needed TIAZAC 360 MG XR24H-CAP (DILTIAZEM HCL ER BEADS) 1 by mouth daily ROCALTROL 0.25 MCG CAPS (CALCITRIOL) Mon and Fri ALLOPURINOL 100 MG TABS (ALLOPURINOL) 1 by mouth daily MIRALAX  POWD (POLYETHYLENE GLYCOL 3350) every other day PROCRIT 16109 UNIT/ML SOLN (EPOETIN ALFA) 60000 units every 4 weeks PRAVASTATIN SODIUM 20 MG TABS (PRAVASTATIN SODIUM) 1 by mouth daily LEVOTHROID 25 MCG TABS (LEVOTHYROXINE SODIUM) 1 by mouth daily      Allergies Added: ! CODEINE   Visit Type:  Follow-up Primary Provider:  None  CC:  Carotid Stenosis.  History of Present Illness: The patient presents for followup of carotid stenosis and mild aortic stenosis. She currently does not have a primary care doctor but has multiple specialists. She says she is getting along fairly well though she had asthma through much of January. Her endocrinologist has given her very specific instructions about the need to lose weight. He gave her her instructions about chair exercising and she has been seeing a nutritionist. She still wears oxygen at night for previously diagnosed oxygen desaturation treated in the past by a  pulmonologist. She is relatively sedentary being limited by some joint pains. She is not describing palpitations, presyncope or syncope. She is not describing PND or orthopnea. She is not having chest pressure, neck or arm discomfort.  Current Medications (verified): 1)  Losartan Potassium 100 Mg Tabs (Losartan Potassium) .Marland Kitchen.. 1 By Mouth Daily 2)  Furosemide 80 Mg Tabs (Furosemide) .Marland Kitchen.. 1 By Mouth Daily 3)  Actos 15 Mg Tabs (Pioglitazone Hcl) .Marland Kitchen.. 1 By Mouth Daily 4)  Omeprazole 20 Mg Cpdr (Omeprazole) .Marland Kitchen.. 1 By Mouth Daily 5)  Aspirin 81 Mg  Tabs (Aspirin) .Marland Kitchen.. 1 By Mouth Daily 6)  Advair Diskus 250-50 Mcg/dose Aepb (Fluticasone-Salmeterol) .... As Directed 7)  Singulair 10 Mg Tabs (Montelukast Sodium) .Marland Kitchen.. 1 By Mouth Daily 8)  Proventil Hfa 108 (90 Base) Mcg/act Aers (Albuterol Sulfate) .... As Directed 9)  Lantus 100 Unit/ml Soln (Insulin Glargine) .... As Directed 10)  Novolog Mix 70/30 Flexpen 70-30 % Susp (Insulin Aspart Prot & Aspart) .... As Directed 11)  Ferrous Gluconate 325 Mg Tabs (Ferrous Gluconate) .Marland Kitchen.. 1 By Mouth Three Times A Day 12)  Docusate Sodium 100 Mg Caps (Docusate Sodium) .Marland Kitchen.. 1 By Mouth Two Times A Day As Needed 13)  Tiazac 360 Mg Xr24h-Cap (Diltiazem Hcl Er Beads) .Marland Kitchen.. 1 By Mouth Daily 14)  Rocaltrol 0.25 Mcg Caps (Calcitriol) .... Mon and Fri 15)  Allopurinol 100 Mg Tabs (Allopurinol) .Marland Kitchen.. 1 By Mouth Daily 16)  Miralax  Powd (Polyethylene Glycol 3350) .... Every Other Day 17)  Procrit 60454 Unit/ml Soln (Epoetin Alfa) .... 60000 Units Every 4 Weeks 18)  Pravastatin Sodium 20 Mg  Tabs (Pravastatin Sodium) .Marland Kitchen.. 1 By Mouth Daily 19)  Levothroid 25 Mcg Tabs (Levothyroxine Sodium) .Marland Kitchen.. 1 By Mouth Daily  Allergies (verified): 1)  ! Codeine  Past History:  Past Medical History: Reviewed history from 02/23/2009 and no changes required.  1. Diabetes mellitus since 1988.   2. Hypertension since 1990.   3. Sleep apnea on CPAP.   4. Asthma.   5. Morbid obesity.   6.  Mild pulmonary hypertension.   7. Dyslipidemia.   8. Mild aortic stenosis.   9. Carotid stenosis as described.   10.Anemia.   11.Stage III chronic kidney disease.   12.Cholecystectomy.   13.Breast reduction therapy.   14.Hysterectomy.   Past Surgical History: Reviewed history from 02/23/2009 and no changes required. Cholecystectomy.  Breast reduction therapy.  Hysterectomy.   Review of Systems       As stated in the HPI and negative for all other systems.   Vital Signs:  Patient profile:   77 year old female Height:      68 inches Weight:      298 pounds BMI:     45.47 Pulse rate:   71 / minute Resp:     16 per minute BP sitting:   138 / 70  (left arm)  Vitals Entered By: Marrion Coy, CNA (February 27, 2009 3:16 PM)  Physical Exam  General:  Well developed, well nourished, in no acute distress. Head:  normocephalic and atraumatic Eyes:  PERRLA/EOM intact; conjunctiva and lids normal. Mouth:  Teeth, gums and palate normal. Oral mucosa normal. Neck:  Neck supple, no JVD. No masses, thyromegaly or abnormal cervical nodes. Chest Wall:  no deformities or breast masses noted Lungs:  Clear bilaterally to auscultation and percussion. Abdomen:  Bowel sounds positive; abdomen soft and non-tender without masses, organomegaly, or hernias noted. No hepatosplenomegaly, obese Msk:  Back normal, normal gait. Muscle strength and tone normal. Extremities:  No clubbing or cyanosis. Neurologic:  Alert and oriented x 3. Skin:  Intact without lesions or rashes. Cervical Nodes:  no significant adenopathy Axillary Nodes:  no significant adenopathy Psych:  Normal affect.   Detailed Cardiovascular Exam  Neck    Carotids: Carotids full and equal bilaterally without bruits.      Neck Veins: Normal, no JVD.    Heart    Inspection: no deformities or lifts noted.      Palpation: normal PMI with no thrills palpable.      Auscultation: S1 and S2 within normal limits, no S3, no S4, no  clicks, no rubs, 2/6 apical systolic murmur radiating slightly up the aortic outflow tract.  Vascular    Abdominal Aorta: no palpable masses, pulsations, or audible bruits.      Pedal Pulses: normal pedal pulses bilaterally.      Radial Pulses: normal radial pulses bilaterally.      Peripheral Circulation: no clubbing, cyanosis, or edema noted with normal capillary refill.     EKG  Procedure date:  02/27/2009  Findings:      sinus rhythm, rate 71, right bundle branch block, intervals within normal limits otherwise, no acute ST-T wave changes.  Impression & Recommendations:  Problem # 1:  CAROTID STENOSIS (ICD-433.10) She will have carotid Dopplers in August. Orders: EKG w/ Interpretation (93000)  Problem # 2:  AORTIC VALVE DISORDERS (ICD-424.1) The patient has mild aortic valve disease. Clinically this has not worsened. No further studies at this time are indicated but I will follow this with repeat physical examinations.  Problem #  3:  MORBID OBESITY (ICD-278.01)  I agree with Dr.Kumar that this is her most significant problem and I wish her success with nutrition and exercise as she absolutely needs to lose weight.  Problem # 4:  HYPERTENSION (ICD-401.9) Her blood pressure is controlled (she also reports good control of her lipids and glucose). She will continue the meds as listed. Orders: EKG w/ Interpretation (93000)  Patient Instructions: 1)  Your physician recommends that you schedule a follow-up appointment in: 1 YR WITH DR [facility] 2)  Your physician recommends that you continue on your current medications as directed. Please refer to the Current Medication list given to you today.

## 2010-02-07 NOTE — Letter (Signed)
Summary: Requesting Information  Requesting Information   Imported By: Roderic Ovens 03/14/2009 13:40:15  _____________________________________________________________________  External Attachment:    Type:   Image     Comment:   External Document

## 2010-02-07 NOTE — Letter (Signed)
Summary: [facility] Patient Note   [facility] Patient Note   Imported By: Roderic Ovens 07/18/2009 10:17:11  _____________________________________________________________________  External Attachment:    Type:   Image     Comment:   External Document

## 2010-02-07 NOTE — Miscellaneous (Signed)
Summary: [facility] Orders  [facility] Orders   Imported By: Roderic Ovens 02/12/2009 13:10:43  _____________________________________________________________________  External Attachment:    Type:   Image     Comment:   External Document

## 2010-02-07 NOTE — Letter (Signed)
Summary: [facility] Patient Note   [facility] Patient Note   Imported By: Roderic Ovens 08/23/2009 12:38:26  _____________________________________________________________________  External Attachment:    Type:   Image     Comment:   External Document

## 2010-02-11 ENCOUNTER — Telehealth: Payer: Self-pay | Admitting: Cardiology

## 2010-02-12 ENCOUNTER — Encounter: Payer: Medicare Other | Admitting: Physician Assistant

## 2010-02-12 LAB — CULTURE, BLOOD (ROUTINE X 2)
Culture  Setup Time: 201202010504
Culture  Setup Time: 201202011447
Culture: NO GROWTH
Culture: NO GROWTH

## 2010-02-18 NOTE — Discharge Summary (Signed)
[name], [name]               ACCOUNT NO.:  [id number]  MEDICAL RECORD NO.:  [id number]           PATIENT TYPE:  I  LOCATION:  2502                         FACILITY:  [facility]  PHYSICIAN:  Mauro Kaufmann, MD         DATE OF BIRTH:  Nov 03, 1932  DATE OF ADMISSION:  02/05/2010 DATE OF DISCHARGE:  02/08/2010                              DISCHARGE SUMMARY   ADMISSION DIAGNOSES: 1. Shortness of breath. 2. Morbid obesity. 3. Asthma. 4. Obstructive sleep apnea. 5. Diabetes mellitus. 6. Hypertension. 7. Chronic kidney disease. 8. Anemia of chronic disorder. 9. Gout.  DISCHARGE DIAGNOSES: 1. Dyspnea, resolved, multifactorial secondary to asthma. 2. Diastolic dysfunction. 3. Acute bronchitis. 4. Morbid obesity. 5. History of asthma. 6. Obstructive sleep apnea. 7. Chronic kidney disease, creatinine improved after given IV Lasix. 8. Diabetes mellitus. 9. Gout.  TESTS PERFORMED DURING THE HOSPITAL STAY: 1. Chest x-ray on February 05, 2010, showed no active cardiopulmonary     disease. 2. Chest x-ray on February 07, 2010, showed stable enlargement of     cardiac silhouette, no pulmonary edema, consolidation of pleural     effusion evident.  No pneumothorax seen. 3. V/Q scan on February 07, 2010, showed very low probability for acute     coronary embolism.  PERTINENT LABS:  The patient's urine culture on February 06, 2010 negative.  Blood cultures x4 negative on both February 05, 2010 as well as February 06, 2010.  The patient's creatinine improved from 2.24 on February 05, 2010 to 1.78 as of February 07, 2010.  BRIEF HISTORY AND PHYSICAL:  A 77 year old African American female morbidly obese, history of asthma, on chronic home oxygen as well as history of obstructive sleep apnea, on CPAP, who came to the ER with shortness of breath for 2-3 weeks duration, which has been getting progressively worse.  The patient denies any history of cough, no fever, no chills.  The patient denied any  history of PND or orthopnea.  She was getting short of breath and walking less than half a block.  The patient was admitted with diagnosis of dyspnea with differential including pulmonary embolism, cor pulmonale, pulmonary hypertension, acute coronary syndrome, and the patient was started on antibiotics, a V/Q scan was ordered and also was given Lasix.  BRIEF HOSPITAL COURSE: 1. Dyspnea.  The patient's dyspnea improved after she was put on Lasix     in fact the patient's renal function improved markedly after given     Lasix and she diuresed very well in the hospital.  Her creatinine     has came down from 2.24 to 1.78 and breathing improved     considerably.  An echocardiogram was done, which showed ejection     fraction of 65%.  Her previous echo in September 2006 showed mild     diastolic dysfunction.  Though she did not have very high BNP, but     she diuresed very well with the Lasix.  It is in the possibility of     diastolic dysfunction as causes for the dyspnea.  The patient also     had asthma  exacerbation along with bronchitis, so she was also on     antibiotics.  Nevertheless, the patient has improved with all these     measures and at this time, the patient will be sent home on Lasix     80 mg p.o. daily.  The patient has been started also on metoprolol,     which will be continued.  The patient has been counseled on a low-     salt diet. 2. Acute bronchitis.  The patient has been on Rocephin in the hospital     and we are going to send the patient home on Avelox for five more     days.  She had low-grade temperature with chest x-ray is negative,     blood cultures x4 negative as well as the urine culture negative.     The patient started coughing this morning, so she will be treated     for bronchitis with Avelox for five more days. 3. Chronic renal disease.  The patient has a history of chronic renal     disease.  Her creatinine improved after giving the Lasix and she      was advised to follow up with her nephrologist and continue to take     Lasix 80 mg p.o. daily. 4. Diabetes mellitus.  The patient has been on Actos, which have been     caused fluid retentions, so I am going to stop the Actos.  The     patient will continue to use the insulin, NovoLog as well as Lantus     at home.  The patient should follow up with Dr. Lucianne Muss, with her     endocrinologist to adjust medication for diabetes. 5. Hypertension.  The patient's blood pressure was well controlled.  I     am going to stop the patient's Cardizem at this time and start the     patient on metoprolol, which has in fact been working fine.  So,     the patient will be sent home on metoprolol.  If the patient is     unable to tolerate metoprolol because of the underlying asthma,     then she can be switch back to Cardizem. 6. Obstructive sleep apnea.  The patient will continue to use CPM at     home. 7. Anemia.  The patient will continue to use ferrous sulfate at home.  MEDICATION ON DISCHARGE: 1. Losartan 50 mg p.o. daily. 2. Moxifloxacin 40 mg p.o. daily for 5 days. 3. Metoprolol 21 mg p.o. b.i.d. 4. Advair 250/50 one puff inhaled twice a day. 5. Albuterol inhaler one puff every 4 hours as needed. 6. Allopurinol 100 mg one p.o. daily. 7. Aspirin 81 mg p.o. daily. 8. Antara 43 mg one tablet p.o. daily. 9. Calcitriol 1-5 mcg one capsule p.o. twice a day. 10.Docusate 100 mg one capsule p.o. twice a day. 11.Ferrous sulfate 325 mg one tablet p.o. b.i.d. 12.Furosemide 80 mg one p.o. daily. 13.Insulin aspart 4 units in the a.m. and 18 units at bedtime. 14.Lantus 56 units subcu in the morning. 15.Synthroid 25 mcg one p.o. daily. 16.Pravachol 20 mg p.o. daily. 17.Prilosec 20 mg one p.o. twice a day. 18.Singulair 10 mg p.o. daily.     Mauro Kaufmann, MD     GL/MEDQ  D:  02/08/2010  T:  02/09/2010  Job:  [id number]  cc:   Sigmund Hazel, M.D. Rollene Rotunda, MD, [facility] Reather Littler, M.D.  Electronically  Signed by Mauro Kaufmann  on 02/18/2010 10:39:37 AM

## 2010-02-21 ENCOUNTER — Encounter: Payer: Self-pay | Admitting: Cardiology

## 2010-02-21 NOTE — Progress Notes (Signed)
Summary: pt seen in er wants sooner appt   Phone Note Call from Patient   Caller: Patient [phone] Reason for Call: Talk to Nurse Summary of Call: pt seen in er for sob/was told she has chf and needs to see Zylie Mumaw(HAS APPT 2-24) no problems right now but wants to see him sooner-he's at 100% or not in the office up till her appt Initial call taken by: Glynda Jaeger,  February 11, 2010 11:00 AM  Follow-up for Phone Call        Called patient back. She is not comfortable waiting to see Dr.Linda Grimmer on 2/24 for her post hospital visit. She is not having any acute problems but would like to be seen sooner. Since Dr.Aarionna Germer has no available sooner appointments, she will see Tereso Newcomer tomorrow. She asked that I keep the appointment on 2/24 with [facility] also.  Layne Benton, RN, BSN  February 11, 2010 12:04 PM

## 2010-02-27 NOTE — Miscellaneous (Signed)
  Clinical Lists Changes  Observations: Added new observation of RESULTS MISC:  Very low probability for acute pulmonary embolism. (02/07/2010 15:44) Added new observation of ECHOINTERP:  - Left ventricle: Systolic function was normal. The estimated     ejection fraction was in the range of 60% to 65%.   - Pulmonary arteries: Systolic pressure was mildly to moderately     increased. PA peak pressure: 53mm Hg (S). (02/06/2010 15:44)      Echocardiogram  Procedure date:  02/06/2010  Findings:       - Left ventricle: Systolic function was normal. The estimated     ejection fraction was in the range of 60% to 65%.   - Pulmonary arteries: Systolic pressure was mildly to moderately     increased. PA peak pressure: 53mm Hg (S).  MISC. Report  Procedure date:  02/07/2010  Findings:       Very low probability for acute pulmonary embolism.

## 2010-03-01 ENCOUNTER — Ambulatory Visit: Payer: Self-pay | Admitting: Cardiology

## 2010-03-05 ENCOUNTER — Encounter (HOSPITAL_BASED_OUTPATIENT_CLINIC_OR_DEPARTMENT_OTHER): Payer: Medicare Other | Admitting: Oncology

## 2010-03-05 ENCOUNTER — Other Ambulatory Visit: Payer: Self-pay | Admitting: Oncology

## 2010-03-05 DIAGNOSIS — I10 Essential (primary) hypertension: Secondary | ICD-10-CM

## 2010-03-05 DIAGNOSIS — N289 Disorder of kidney and ureter, unspecified: Secondary | ICD-10-CM

## 2010-03-05 DIAGNOSIS — D649 Anemia, unspecified: Secondary | ICD-10-CM

## 2010-03-05 DIAGNOSIS — E119 Type 2 diabetes mellitus without complications: Secondary | ICD-10-CM

## 2010-03-05 LAB — CBC WITH DIFFERENTIAL/PLATELET
Basophils Absolute: 0 10*3/uL (ref 0.0–0.1)
Eosinophils Absolute: 0.2 10*3/uL (ref 0.0–0.5)
HGB: 10.1 g/dL — ABNORMAL LOW (ref 11.6–15.9)
NEUT#: 3.9 10*3/uL (ref 1.5–6.5)
RDW: 15.8 % — ABNORMAL HIGH (ref 11.2–14.5)
WBC: 5.9 10*3/uL (ref 3.9–10.3)
lymph#: 1.4 10*3/uL (ref 0.9–3.3)

## 2010-03-05 NOTE — Letter (Signed)
Summary: [facility]  [facility]   Imported By: Marylou Mccoy 02/28/2010 11:52:48  _____________________________________________________________________  External Attachment:    Type:   Image     Comment:   External Document

## 2010-03-05 NOTE — Letter (Signed)
Summary: [facility] Patient Note   [facility] Patient Note   Imported By: Roderic Ovens 02/27/2010 12:41:32  _____________________________________________________________________  External Attachment:    Type:   Image     Comment:   External Document

## 2010-03-27 LAB — BASIC METABOLIC PANEL
BUN: 59 mg/dL — ABNORMAL HIGH (ref 6–23)
Creatinine, Ser: 2.24 mg/dL — ABNORMAL HIGH (ref 0.4–1.2)
GFR calc non Af Amer: 21 mL/min — ABNORMAL LOW (ref >60)
Glucose, Bld: 142 mg/dL — ABNORMAL HIGH (ref 70–99)
Potassium: 3.9 mEq/L (ref 3.5–5.1)

## 2010-03-27 LAB — URINALYSIS, ROUTINE W REFLEX MICROSCOPIC
Bilirubin Urine: NEGATIVE
Hgb urine dipstick: NEGATIVE
Ketones, ur: NEGATIVE mg/dL
Nitrite: NEGATIVE
Specific Gravity, Urine: 1.01 (ref 1.005–1.030)
Urobilinogen, UA: 0.2 mg/dL (ref 0.0–1.0)
pH: 5.5 (ref 5.0–8.0)

## 2010-03-27 LAB — D-DIMER, QUANTITATIVE: D-Dimer, Quant: 0.78 ug/mL-FEU — ABNORMAL HIGH (ref 0.00–0.48)

## 2010-04-02 ENCOUNTER — Encounter (HOSPITAL_BASED_OUTPATIENT_CLINIC_OR_DEPARTMENT_OTHER): Payer: Medicare Other | Admitting: Oncology

## 2010-04-02 ENCOUNTER — Other Ambulatory Visit: Payer: Self-pay | Admitting: Oncology

## 2010-04-02 DIAGNOSIS — N289 Disorder of kidney and ureter, unspecified: Secondary | ICD-10-CM

## 2010-04-02 DIAGNOSIS — I10 Essential (primary) hypertension: Secondary | ICD-10-CM

## 2010-04-02 DIAGNOSIS — E119 Type 2 diabetes mellitus without complications: Secondary | ICD-10-CM

## 2010-04-02 DIAGNOSIS — D649 Anemia, unspecified: Secondary | ICD-10-CM

## 2010-04-02 LAB — CBC WITH DIFFERENTIAL/PLATELET
Basophils Absolute: 0 10*3/uL (ref 0.0–0.1)
Eosinophils Absolute: 0.3 10*3/uL (ref 0.0–0.5)
HGB: 10.3 g/dL — ABNORMAL LOW (ref 11.6–15.9)
LYMPH%: 31.8 % (ref 14.0–49.7)
MCV: 84.9 fL (ref 79.5–101.0)
MONO#: 0.3 10*3/uL (ref 0.1–0.9)
MONO%: 3.8 % (ref 0.0–14.0)
NEUT#: 4 10*3/uL (ref 1.5–6.5)
Platelets: 208 10*3/uL (ref 145–400)
RDW: 15.4 % — ABNORMAL HIGH (ref 11.2–14.5)

## 2010-04-23 LAB — URINE CULTURE

## 2010-04-23 LAB — CBC
HCT: 31.3 % — ABNORMAL LOW (ref 36.0–46.0)
MCV: 82.4 fL (ref 78.0–100.0)
Platelets: 214 10*3/uL (ref 150–400)
RBC: 3.79 MIL/uL — ABNORMAL LOW (ref 3.87–5.11)
WBC: 7.7 10*3/uL (ref 4.0–10.5)

## 2010-04-23 LAB — DIFFERENTIAL
Basophils Absolute: 0 10*3/uL (ref 0.0–0.1)
Basophils Relative: 1 % (ref 0–1)
Eosinophils Relative: 4 % (ref 0–5)
Monocytes Absolute: 0.3 10*3/uL (ref 0.1–1.0)
Neutro Abs: 5.8 10*3/uL (ref 1.7–7.7)

## 2010-04-23 LAB — URINALYSIS, ROUTINE W REFLEX MICROSCOPIC
Glucose, UA: NEGATIVE mg/dL
Ketones, ur: NEGATIVE mg/dL
Specific Gravity, Urine: 1.017 (ref 1.005–1.030)
pH: 6 (ref 5.0–8.0)

## 2010-04-23 LAB — COMPREHENSIVE METABOLIC PANEL
AST: 16 U/L (ref 0–37)
Albumin: 3.5 g/dL (ref 3.5–5.2)
Alkaline Phosphatase: 56 U/L (ref 39–117)
BUN: 55 mg/dL — ABNORMAL HIGH (ref 6–23)
CO2: 29 mEq/L (ref 19–32)
Chloride: 100 mEq/L (ref 96–112)
Creatinine, Ser: 2.13 mg/dL — ABNORMAL HIGH (ref 0.4–1.2)
GFR calc non Af Amer: 23 mL/min — ABNORMAL LOW (ref >60)
Potassium: 4.5 mEq/L (ref 3.5–5.1)
Total Bilirubin: 0.5 mg/dL (ref 0.3–1.2)

## 2010-04-23 LAB — URINE MICROSCOPIC-ADD ON

## 2010-04-30 ENCOUNTER — Other Ambulatory Visit: Payer: Self-pay | Admitting: Oncology

## 2010-04-30 ENCOUNTER — Encounter (HOSPITAL_BASED_OUTPATIENT_CLINIC_OR_DEPARTMENT_OTHER): Payer: Medicare Other | Admitting: Oncology

## 2010-04-30 DIAGNOSIS — N289 Disorder of kidney and ureter, unspecified: Secondary | ICD-10-CM

## 2010-04-30 DIAGNOSIS — D649 Anemia, unspecified: Secondary | ICD-10-CM

## 2010-04-30 LAB — CBC WITH DIFFERENTIAL/PLATELET
BASO%: 0.2 % (ref 0.0–2.0)
EOS%: 2.7 % (ref 0.0–7.0)
MCH: 26.4 pg (ref 25.1–34.0)
MCHC: 31.2 g/dL — ABNORMAL LOW (ref 31.5–36.0)
MONO#: 0.4 10*3/uL (ref 0.1–0.9)
RDW: 16.1 % — ABNORMAL HIGH (ref 11.2–14.5)
WBC: 6.3 10*3/uL (ref 3.9–10.3)
lymph#: 1.7 10*3/uL (ref 0.9–3.3)
nRBC: 0 % (ref 0–0)

## 2010-05-21 NOTE — Assessment & Plan Note (Signed)
[facility]                            CARDIOLOGY OFFICE NOTE   [name], [name]                      MRN:          [id number]  DATE:02/14/2008                            DOB:          02-22-1932    PRIMARY CARE PHYSICIAN:  Vikki Ports, MD   REASON FOR PRESENTATION:  Evaluate the patient with carotid stenosis and  multiple cardiovascular risk factors.   HISTORY OF PRESENT ILLNESS:  The patient is now 77 years old.  She  presents for yearly followup.  The last appointment she was noted to  have systolic murmur and was found to have mild aortic stenosis.  Her  ejection fraction is well-preserved.  She does have a carotid bruit and  has moderate carotid plaque.  This was last evaluated in August 2009.  The right was 0% to 39% stenosed.  The left 40% to 59% stenosed.  She  did have a stress perfusion study in 2009 demonstrating no evidence of  ischemia or infarct.  Her EF was 78%.   She returns for yearly followup.  She is seeing Dr. Lucianne Muss and has close  followup of her diabetes.  She states she also had her lipids checked.  I did start her on pravastatin at the last appointment.  She tolerated  this.  She is very limited now by hip pain.  She can only get around  slowly.  With this, she is not having any chest pressure, neck, or arm  discomfort.  She  is not having any palpitations, presyncope, or  syncope.  She has no PND or orthopnea.   PAST MEDICAL HISTORY:  1. Diabetes mellitus since 1988.  2. Hypertension since 1990.  3. Sleep apnea on CPAP.  4. Asthma.  5. Morbid obesity.  6. Mild pulmonary hypertension.  7. Dyslipidemia.  8. Mild aortic stenosis.  9. Carotid stenosis as described.  10.Anemia.  11.Stage III chronic kidney disease.  12.Cholecystectomy.  13.Breast reduction therapy.  14.Hysterectomy.   ALLERGIES:  None.   MEDICATIONS:  1. Levoxyl 25 mcg daily.  2. Allopurinol 100 mg daily.  3. Antara 42 mg b.i.d.  4.  Aspirin 81 mg daily.  5. Advair 250/50 daily.  6. Omeprazole 20 mg daily.  7. Procrit.  8. Actos 15 mg daily.  9. Cozaar 100 mg daily.  10.NovoLog and Humalog.  11.Humulin N.  12.Iron.  13.Colace.  14.Singulair.  15.Lasix 80 mg daily.  16.Tiazac 360 mg daily.  17.Pravastatin 20 mg daily.  18.Calcitriol.   REVIEW OF SYSTEMS:  As stated in the HPI and otherwise negative for  other systems.   PHYSICAL EXAMINATION:  GENERAL:  The patient is pleasant in no distress.  VITAL SIGNS:  Blood pressure 131/65, heart rate 86 and regular, weight  300 pounds, and body mass index 46.  HEENT:  Eyes are unremarkable.  Pupils equal, round, and reactive to  light.  Fundi not visualized.  Oral mucosa, unremarkable.  NECK:  No jugular venous distention at 45 degrees, waveform within  normal limits.  Carotid upstroke brisk and symmetric.  Left carotid  bruit.  No  thyromegaly.  LYMPHATICS:  No cervical, axillary, or inguinal adenopathy.  LUNGS:  Clear to auscultation bilaterally.  BACK:  No costovertebral tenderness.  CHEST:  Unremarkable.  HEART:  PMI not displaced or sustained.  S1 and S2 within normal limits.  No S3.  No S4.  A 2/6 apical systolic murmur radiating at the aortic  outflow tract, no diastolic murmurs.  ABDOMEN:  Morbidly obese, positive bowel sounds, normal in frequency and  pitch.  No bruits.  No rebound, guarding, or midline pulsatile mass.  No  hepatomegaly or splenomegaly.  SKIN:  No rashes.  No nodules.  EXTREMITIES:  Pulses 2+ throughout.  No edema, cyanosis, or clubbing.  NEUROLOGIC:  Oriented to place, person, and time.  Cranial nerves II  through XII grossly intact.  Motor grossly intact throughout.   EKG, sinus rhythm, right bundle-branch block, right axis deviation, low-  voltage in the limb leads and chest leads, no acute ST-T wave changes.   ASSESSMENT AND PLAN:  1. Carotid stenosis.  The patient has moderate carotid stenosis      slightly progressed on the left  side.  This will be repeated again      in August.  She will continue with the risk reduction.  2. Aortic stenosis.  This is mild by physical exam again.  No further      evaluation is warranted at this point.  3. Risk reduction.  The patient is having her diabetes and cholesterol      followed by Dr. Lucianne Muss.  I would suggest a goal LDL less than 70 and      HDL greater than 50 in this patient was peripheral vascular disease      and diabetes.  I will defer it to Dr. Lucianne Muss and Dr. Theresia Lo.  4. Obesity.  I cautioned her about gaining weight now that she is even      less active.  She needs to cut back her portions in calories.  5. Followup.  I will see her back in 1 year, but she will get a      Doppler in August.     Rollene Rotunda, MD, [facility]  Electronically Signed    JH/MedQ  DD: 02/14/2008  DT: 02/15/2008  Job #: [id number]   cc:   Vikki Ports, M.D.  Reather Littler, M.D.

## 2010-05-21 NOTE — Assessment & Plan Note (Signed)
[facility]                            CARDIOLOGY OFFICE NOTE   [name], [name]                      MRN:          [id number]  DATE:02/22/2007                            DOB:          December 16, 1932    PRIMARY:  Dr. Leatrice Jewels.   REASON FOR PRESENTATION:  Evaluate the patient with carotid stenosis and  multiple cardiovascular risk factors.   HISTORY OF PRESENT ILLNESS:  The patient is a lovely 77 year old who  presented for evaluation the first time to see me in December.  She had  been seeing Dr. Samule Ohm.  She had a slight murmur and I sent her for an  echocardiogram which confirmed mild aortic sclerosis with a mean  gradient of only 9 mmHg.  Otherwise her echo was unremarkable.  We did  discuss stress perfusion study as she is sedentary, has some dyspnea  with exertion and fatigue.  The she wanted to defer this until further  discussion.  She is also due for carotid Doppler as she has some carotid  stenosis of 40-59% on the right and 0-39% on the left.  This is due to  be done in August of this year.   Since I last saw her the patient has had no new complaints.  She is  limited by the fatigue and dyspnea.  She is not having any resting  shortness of breath and does not describe any PND or orthopnea.  She had  no palpitations, presyncope or syncope.  She does not describe chest  discomfort, neck or arm discomfort.  However, she does not exert herself  because of the above limitations.   PAST MEDICAL HISTORY:  Diabetes mellitus since 1988, hypertension since  1990, sleep apnea on CPAP, asthma, morbid obesity, mild pulmonary  tension, dyslipidemia, anemia, stage III chronic kidney disease,  cholecystectomy, breast reduction, hysterectomy.   ALLERGIES:  None.   MEDICATIONS:  Antara 43 mg b.i.d., aspirin 81 mg daily, Advair 250/50,  Diltiazem 240 mg daily, Omeprazole 20 mg daily, Procrit, Actos 15 mg  daily, Cozaar 100 mg daily, NovoLog,  Humulin, iron 325 mg t.i.d., Colace  100 mg q.i.d., Singulair, Lasix 80 mg daily.   REVIEW OF SYSTEMS:  As stated in the HPI otherwise and other systems.   PHYSICAL EXAMINATION:  The patient is in no distress.  Blood pressure 140/66, heart rate 60 and regular, weight 303 pounds.  HEENT:  Eyelids unremarkable.  Pupils are equal, round, and reactive to  light and accommodation.  Fundi are not visualized.  Oral mucosa  unremarkable.  NECK:  No jugular venous distension at 45 degrees, carotid upstroke  brisk and symmetric, no bruits, thyromegaly.  LYMPHATICS:  No cervical, axillary, or inguinal adenopathy.  LUNGS:  Clear to auscultation bilaterally.  BACK:  No costovertebral angle tenderness.  CHEST:  Unremarkable.  HEART:  PMI not displaced or sustained, S1 and S2 within normal limits,  no S3, no S4, 2/6 apical systolic murmur radiating out the aortic  outflow tract and early peaking.  No diastolic murmurs.  ABDOMEN:  Obese, positive bowel sounds, normal in frequency and  pitch,  no bruits, rebound, guarding.  No midline pulsatile masses,  hepatomegaly, splenomegaly.  SKIN:  No rashes, no nodules.  EXTREMITIES:  With 2+ pulses throughout, no edema, cyanosis, clubbing.  NEURO:  Grossly intact.   ASSESSMENT:  1. Carotid stenosis.  The patient will have a Doppler in August.  2. Murmur.  The patient has mild aortic stenosis and this can be      followed clinically.  3. Dyspnea.  This is probably multifactorial.  However, given her risk      factors, she agrees to a stress test.  She would not be able to      walk on a treadmill so she will have to have an adenosine Myoview.  4. Risk reduction.  Based on the Heart Protection Study, the patient      would benefit from a statin and I will go ahead and give her      pravastatin 20 mg daily.  When she sees Dr. Lucianne Muss in the future,      she can get a lipid profile and liver enzymes.  5. Obesity.  We had a long discussion about the need to lose  weight      with diet and exercise.  I reviewed at length her meals from      yesterday.  She does not eat a lot of food, but she does make some      choices that are higher calorie.  She also needs to start      increasing her activity.  6. Follow-up.  I will see her back in 1 year or sooner if needed.     Rollene Rotunda, MD, [facility]  Electronically Signed    JH/MedQ  DD: 02/22/2007  DT: 02/22/2007  Job #: [id number]   cc:   Vikki Ports, M.D.  Reather Littler, M.D.

## 2010-05-21 NOTE — Assessment & Plan Note (Signed)
[facility]                            CARDIOLOGY OFFICE NOTE   [name], [name]                      MRN:          [id number]  DATE:12/24/2006                            DOB:          18-Feb-1932    PRIMARY CARE PHYSICIAN:  Dr. Leatrice Jewels.   REASON FOR PRESENTATION:  Evaluate patient with carotid stenosis.   HISTORY OF PRESENT ILLNESS:  The patient is a lovely 77 year old who was  seen last August by Dr. Samule Ohm for evaluation of possible renal artery  stenosis.  She has renal insufficiency.  She was seen and had a renal  angiogram which demonstrated no evidence of stenosis.  She is followed  by Dr. Eliott Nine, and it is felt that her kidney disease is related to long-  standing diabetes.  She did have carotid stenosis and had Doppler  demonstrating 40% to 59% right stenosis and zero to 39% left stenosis.  She presented for followup of this thinking it was time to have this  checked again.  The plan was to do it actually in August of 2009.   She says she has been doing relatively well.  She has been limited for a  long time by chronic dyspnea.  She does not walk very  much because of  this.  She becomes fatigued.  However, she does not have any chest  discomfort, neck or arm discomfort.  She does not report any  palpitations, presyncope or syncope.  She does not have any resting  shortness of breath, and has no PND or orthopnea.  She does have sleep  apnea and wears CPAP.   The patient does report a catheterization in the 1990s which was  apparently okay.  She says she has not had any recent stress testing.   PAST MEDICAL HISTORY:  1. Diabetes mellitus since 1988.  2. Hypertension since 1990.  3. Sleep apnea on CPAP.  4. Asthma.  5. Morbid obesity.  6. Mild pulmonary hypertension.  7. Hyperlipidemia.  8. Anemia.  9. Stage 3 chronic kidney disease.  10.Cholecystectomy.  11.Breast reduction.  12.Hysterectomy.   ALLERGIES:  CODEINE.   MEDICATIONS:  1. Antara 43 mg b.i.d.  2. Aspirin 81 mg daily.  3. Advair 250/50 daily.  4. Diltiazem 240 mg daily.  5. Omeprazole 20 mg daily.  6. Procrit.  7. Actos 15 mg daily.  8. Cozaar 100 mg daily.  9. NovoLog.  10.Humulin.  11.Iron.  12.Colace 100 mg q.i.d.  13.Singulair 10 mg daily.  14.Lasix 80 mg daily.   REVIEW OF SYSTEMS:  As stated in the HPI and otherwise negative for  other systems.   PHYSICAL EXAMINATION:  The patient is in no distress.  Blood pressure 159/78, heart rate 73 and regular, weight 301 pounds,  body mass index 46.  HEENT:  Eyelids unremarkable, pupils are equal, round, and reactive to  light, fundi not visualized.  Oral mucosa unremarkable.  NECK:  No jugular venous distension, wave form within normal limits,  carotid upstroke brisk and symmetrical.  Bilateral carotid bruits versus  transmitted systolic murmur.  No thyromegaly.  LYMPHATICS:  No cervical, axillary, inguinal adenopathy.  LUNGS:  Clear to auscultation bilaterally.  BACK:  No costovertebral angle tenderness.  CHEST:  Unremarkable.  HEART:  PMI not displaced or sustained.  S1 and S2 are within normal  limits.  No S3, no S4.  A 2/6 apical systolic murmur radiating out the  aortic outflow tract and early peaking, no diastolic murmurs.  ABDOMEN:  Obese, positive bowel sounds, normal in frequency and pitch.  No bruits, no rebound, no guarding.  No midline pulsatile mass.  No  hepatomegaly, no splenomegaly.  SKIN:  No rashes, no nodules.  EXTREMITIES:  2+ pulses throughout, no cyanosis, clubbing, or edema.  NEURO:  Oriented to person, place, and time.  Cranial nerves II-XII  grossly intact, motor grossly intact.   ASSESSMENT AND PLAN:  1. Carotid stenosis:  The patient is actually not due to have her      carotid Dopplers checked again until August of 2009, and she will      be on the call back list for this.  She will continue with primary      risk reduction, given the peripheral  vascular disease.  2. Murmur:  The patient has a murmur that is more consistent with      slight aortic stenosis than an mitral regurgitation murmur.  She      did have some mild mitral regurgitation documented on an echo in      2003.  I am going to order an echocardiogram to further evaluate      valvular abnormalities, but do not suspect that she would have any      severe critical disease.  3. Rule out coronary disease:  The patient has carotid stenosis and      long-standing diabetes.  She ought to have a stress perfusion study      for screening purposes according to guidelines.  She says she would      not be able to walk on a treadmill, and we would need to do an      adenosine Cardiolite.  She wants to consider this later, and I will      have her back in a couple of months to discuss this.  4. Risk reduction:  She is having excellent followup of her diabetes      and dyslipidemia per her primary care physician and Dr. Lucianne Muss.  5. Obesity:  We discussed the need to lose weight with diet and      exercise.  I suggested a [place] approach.  6. Followup:  I would like to see her back in about 2 months and      further discuss stress perfusion study.     Rollene Rotunda, MD, [facility]  Electronically Signed    JH/MedQ  DD: 12/24/2006  DT: 12/25/2006  Job #: [id number]   cc:   Vikki Ports, M.D.  Duke Salvia Eliott Nine, M.D.  Leighton Roach. Truett Perna, M.D.  Reather Littler, M.D.

## 2010-05-24 NOTE — Op Note (Signed)
[name], [name]               ACCOUNT NO.:  [id number]   MEDICAL RECORD NO.:  [id number]          PATIENT TYPE:  AMB   LOCATION:  ENDO                         FACILITY:  [facility]   PHYSICIAN:  John C. Madilyn Fireman, M.D.    DATE OF BIRTH:  1932/05/03   DATE OF PROCEDURE:  03/13/2005  DATE OF DISCHARGE:                                 OPERATIVE REPORT   INDICATIONS FOR PROCEDURE:  A patient with multiple small sigmoid and  rectosigmoid polyps on previous colonoscopy with one showing carcinoid tumor  which extended to the resection site.  This procedure is to better assess  the polypectomy site if it can be identified for residual carcinoid tumor.   PROCEDURE:  The patient was placed in the left lateral decubitus position  and placed on the pulse monitor with continuous low flow oxygen delivered by  nasal cannula.  She was stated 50 mcg IV Fentanyl and 4 mg IV Versed in  addition to medicine given for the previous EGD.  The Olympus video  colonoscope was inserted into the rectum and advanced to approximately the  hepatic flexure. There was a lot of difficulty and resistance to passage of  the scope beyond about 46 cm.  The patient was having a lot of discomfort.  The resistance could not be easily overcome despite multiple position  changes, torquing maneuvers, and abdominal pressure.  Given that we had done  a complete colonoscopy only two months ago, I decided not to make more  vigorous attempts when I encountered difficulty, and thus, the procedure was  only performed to about the hepatic flexure.  The mucosa in this area  appeared normal as did the transverse colon.  Within the descending colon  and sigmoid colon, there were several scattered diverticula. In the sigmoid  colon at approximately 30 to 25 cm, there were two previous polypectomy  sites with no visible polyp and I biopsied both these sites and they were  sent in the first specimen container.  In the rectosigmoid area at  approximately 20-15 cm, there were two or three oval-shaped sessile polyps,  approximately 6 x 4 mm each and these were fulgurated by hot biopsy and sent  in a separate specimen container. In the rectum, there appeared to be  another polypectomy site although there was a little bit of visible polyp  associated with it. This was a 15 cm and this was also fulgurated by hot  biopsy was sent in a third specimen container.  There were a few minuscule  polyps that I did not address, generally no more than 3-4 mm diameter with  the typical appearance of hyperplastic polyp.  The scope was then withdrawn  and the patient returned to the recovery room in stable condition.  She  tolerated the procedure well and there were no immediate complications.   IMPRESSION:  1.  Limited study to the hepatic flexure.  2.  Several previous polypectomy sites biopsied.  3.  Small sessile polyps in the rectosigmoid and rectum fulgurated by hot      biopsy.   PLAN:  Await all  biopsy results.           ______________________________  Everardo All Madilyn Fireman, M.D.    JCH/MEDQ  D:  03/13/2005  T:  03/13/2005  Job:  [id number]   cc:   Jillyn Hidden B. Truett Perna, M.D.  Fax: [phone]

## 2010-05-24 NOTE — Op Note (Signed)
[name], [name]               ACCOUNT NO.:  [id number]   MEDICAL RECORD NO.:  [id number]          PATIENT TYPE:  AMB   LOCATION:  ENDO                         FACILITY:  [facility]   PHYSICIAN:  John C. Madilyn Fireman, M.D.    DATE OF BIRTH:  October 09, 1932   DATE OF PROCEDURE:  06/11/2005  DATE OF DISCHARGE:                                 OPERATIVE REPORT   INDICATIONS FOR PROCEDURE:  Patient with previously seen large duodenal  polyp on EGD with benign histology.  Procedure is for reassessment of this  and consideration snaring it if anatomically feasible and not intimately  associated with papilla.   PROCEDURE:  The patient was placed in the left lateral decubitus position  and placed on the pulse monitor with continuous low-flow oxygen delivered by  nasal cannula.  She was sedated with 40 mcg IV fentanyl and 4 mg IV Versed.  The Olympus video side-viewing endoscope was advanced under direct vision  into the oropharynx and esophagus.  Esophagus was straight of normal  caliber.  The squamocolumnar line at 38 cm. There was no visible hiatal  hernia, ring, stricture or other abnormalities of the GE junction.  Stomach  was entered.  A small amount of liquid secretions were suctioned from the  fundus.  Retroflexed view of cardia was unremarkable.  The fundus, body and  pylorus appeared normal.  The duodenum was entered and the bulb appeared  normal.  The second portion of duodenum there was a long pedunculated polyp  which was draped over the papilla but was not intimately associated with  papilla.  The papilla itself appeared normal.  The polyp was snared and the  cut through its stalk and removed in one piece with the [employer].  The  scope was then withdrawn and the patient returned to the recovery room in  stable condition.  She tolerated the procedure well.  There were no  immediate complications.   IMPRESSION:  Duodenal polyp snared.   PLAN:  Await histology.     ______________________________  Everardo All Madilyn Fireman, M.D.     JCH/MEDQ  D:  06/11/2005  T:  06/11/2005  Job:  [id number]   cc:   Dellis Anes. Idell Pickles, M.D.  Fax: [phone]   Leighton Roach Truett Perna, M.D.  Fax: [phone]   Duke Salvia Eliott Nine, M.D.  Fax: [phone]

## 2010-05-24 NOTE — Op Note (Signed)
[name], [name]               ACCOUNT NO.:  [id number]   MEDICAL RECORD NO.:  [id number]          PATIENT TYPE:  OIB   LOCATION:  2807                         FACILITY:  [facility]   PHYSICIAN:  Salvadore Farber, MD  DATE OF BIRTH:  April 19, 1932   DATE OF PROCEDURE:  08/20/2005  DATE OF DISCHARGE:                                 OPERATIVE REPORT   PROCEDURE:  Selective bilateral renal angiography.   INDICATIONS:  Ms. Tailor is a 77 year old lady with long-standing hypertension  and diabetes mellitus.  She had a renal ultrasound which suggested an  atrophic left kidney that was not well visualized.  Subsequent MRA suggested  two renal arteries bilaterally and raised the question of severe stenosis in  the superior left renal artery.  The right-sided arteries were not well  seen.  It was clear from the MRA, however, that the kidneys are normal in  size bilaterally.  She has had an improvement in her renal function since  cessation of Cozaar with creatinine dropping from 2.5 to 1.8.  Dr. Eliott Nine  referred her for invasive angiography to exclude renal artery stenosis as a  contributor to her deteriorating renal function.   PROCEDURE TECHNIQUE:  Informed consent was obtained.  Under 1% lidocaine  local anesthesia, a 5-French sheath was placed in the right common femoral  artery using the modified Seldinger technique.  A 5-French pigtail catheter  was advanced over a wire and positioned in the suprarenal abdominal aorta.  Abdominal aortography was performed using carbon dioxide as the contrast  agent.  This suggested single renal arteries bilaterally.  These were  selectively engaged using a LIMA catheter.  Dilute contrast was administered  and angiography performed.  This demonstrated normal renal arteries  supplying the bulk of both kidneys.  I cannot exclude that there are  accessory renal arteries, though these were not seen on this study.  Since  we were investigating due to  deteriorating renal function, I did not think  that the administration of additional contrast to identify a small accessory  renal artery was worth the added contrast.  The initial vessels appeared to  supply the entirety of both kidneys.  The catheter was then removed over a  wire and the patient transferred to the holding room in stable condition.  The sheath will be removed there.   COMPLICATIONS:  None.   TOTAL CONTRAST:  12 mL of Visipaque.   FINDINGS:  There are single renal arteries bilaterally.  Both are normal.   IMPRESSION/RECOMMENDATIONS:  Normal bilateral renal arteries.  After a  discussion with Dr. Eliott Nine, will check her renal function in four days time.  We then, assuming it remains stable, will resume her Cozaar at 100 mg per  day.      Salvadore Farber, MD  Electronically Signed     WED/MEDQ  D:  08/20/2005  T:  08/20/2005  Job:  [id number]   cc:   Duke Salvia. Eliott Nine, M.D.  Reather Littler, M.D.

## 2010-05-24 NOTE — H&P (Signed)
NAMESEDONIA, KITNER               ACCOUNT NO.:  [id number]   MEDICAL RECORD NO.:  [id number]          PATIENT TYPE:  INP   LOCATION:  5742                         FACILITY:  [facility]   PHYSICIAN:  Deirdre Peer. Polite, M.D. DATE OF BIRTH:  02/10/1932   DATE OF ADMISSION:  10/02/2004  DATE OF DISCHARGE:                                HISTORY & PHYSICAL   CHIEF COMPLAINT:  Shortness of breath, weakness.   HISTORY OF PRESENT ILLNESS:  Ms. Marut is a 77 year old female with multiple  medical problems which include diabetes, asthma, sleep apnea, obesity, who  was directly admitted to [facility] for symptomatic anemia.  The  patient has had symptoms of shortness of breath and dyspnea on exertion and  over the last couple of weeks, swelling in her lower extremities.  The  patient has been followed on an outpatient basis for anemia for sometime.  Labs dating back to December 2005 show a hemoglobin of 8.2 with a follow up  hemoglobin in February 2006 of 9.4, follow up hemoglobin in June of 8.7,  follow up hemoglobin in July of 9.4, and hemoglobin on September 25 of 7.8.  On an outpatient basis, the patient's evaluation included barium study and a  flexible sigmoidoscopy which was unrevealing for bleeding source.  The  patient also had a tissue transglutomase which was reported within normal  limits.  The patient has had heme, occults have been negative repeatedly,  and also has been placed on ferrous sulfate with minimal if any improvement  in her hemoglobin.  As stated, recently this week, hemoglobin was 7.8.  Because of the patient's symptoms of dyspnea, weakness, admission is deemed  necessary for further evaluation and treatment of anemia which is now  becoming symptomatic.   PAST MEDICAL HISTORY:  Significant for hypertension, diabetes, asthma,  obstructive sleep apnea requiring CPAP.  The patient denies MI or CVA.   MEDICATIONS ON ADMISSION:  Glimepiride 4 mg daily, Cozaar 100 mg  daily,  Actos 15 mg daily, Furosemide 20 mg daily which she recently states has been  increased this week which has helped with edema, aspirin 81 mg daily,  Prilosec, ferrous sulfate, Singular 10 mg daily, Simvastatin 20 mg daily,  Advair 100/50 daily, Tiazac 20/40 mg daily, NPH insulin 40 units in the  a.m., 20 units in the p.m., NovoLog 4 units in the a.m., 6 units in the p.m.   SOCIAL HISTORY:  Negative for tobacco, alcohol, or drugs.   PAST SURGICAL HISTORY:  1.  Significant for hysterectomy in 1981 secondary to fibroids.  The patient      may have had some menorrhagia in the past, as well.  2.  Cholecystectomy in 1975.  3.  The patient admits to bilateral breasts reduction 4-5 years ago.   ALLERGIES:  Reports allergy to codeine which caused a headache.   FAMILY HISTORY:  Mother unsure.  Father deceased secondary to MI.  Brother  with hypertension and diabetes.  Two sisters both with breast cancer.   REVIEW OF SYMPTOMS:  Negative for fever, chills.  No nausea, vomiting,  diarrhea, constipation.  Denies any blood in the stool or blood in the  urine.  Does admit to dyspnea on exertion, orthopnea, pedal edema over the  last couple of weeks which has improved since increasing her Lasix.  The  patient denies any hemoptysis.  Denies any chronic NSAID use, as well.   PHYSICAL EXAMINATION:  GENERAL:  The patient is alert and oriented, no apparent distress.  Morbidly  obese.  HEENT:  Muddy sclerae.  Moist oral mucosa.  No nodes.  Very thick neck,  therefore, not able to assess for JVD.  CHEST:  Moderate air movement without rales or rhonchi.  CARDIOVASCULAR:  Regular, no S3.  ABDOMEN:  Obese, no hepatosplenomegaly.  EXTREMITIES:  2+ edema greater on the right than on the left.  The patient  also has venous stasis dermatitis.  Pulses intact.  NEUROLOGICAL:  Essentially nonfocal.   LABORATORY DATA:  As stated in the HPI, other labs are pending at this time.   ASSESSMENT:  1.   Symptomatic anemia characterized by dyspnea on exertion, shortness of      breath, and no energy.  2.  Diabetes well controlled with a hemoglobin A1C recently checked of 6.2.  3.  Asthma.  4.  Obstructive sleep apnea requiring q.h.s. CPAP.  5.  Obesity.  6.  Pedal edema.   RECOMMENDATIONS:  The patient be admitted to a medicine floor today.  The  patient will be treated for her symptomatic anemia first with transfusion,  then will continue to try to identify a cause.  Will obtain iron studies,  heme check stools.  Will check reticulocyte count as well as LDH.  Consider  a GI evaluation.  Because of the patient's pedal edema, will obtain EKG, 2D  echo to rule out high output failure, however, because of the patient's  obstructive sleep apnea, most likely probably has some element of pulmonary  hypertension causing right heart strain which may be the cause of her pedal  edema.  However, echo will be ordered because cannot exclude high output  secondary to her chronic anemia.  Will also obtain thyroid studies.  Will  make further recommendations after review of the above studies.      Deirdre Peer. Polite, M.D.  Electronically Signed     RDP/MEDQ  D:  10/02/2004  T:  10/02/2004  Job:  [id number]   cc:   Vikki Ports, M.D.  Fax: [phone]

## 2010-05-24 NOTE — Discharge Summary (Signed)
[name], [name]               ACCOUNT NO.:  [id number]   MEDICAL RECORD NO.:  [id number]          PATIENT TYPE:  INP   LOCATION:  5742                         FACILITY:  [facility]   PHYSICIAN:  Deirdre Peer. Polite, M.D. DATE OF BIRTH:  [birth year]   DATE OF ADMISSION:  10/02/2004  DATE OF DISCHARGE:  10/05/2004                                 DISCHARGE SUMMARY   DISCHARGE DIAGNOSES:  1.  Iron-deficient anemia, failure to absorb outpatient oral iron, received      intravenous iron in hospital.  2.  Diabetes, well-controlled.  3.  Lower extremity edema, most likely secondary to mild pulmonary      hypertension plus or minus diastolic dysfunction.  Please note, echo      ordered that shows ejection fraction, did show mild pulmonary      hypertension.  4.  Morbid obesity.  5.  Obstructive sleep apnea.   DISCHARGE MEDICATIONS:  The patient to resume outpatient medications, which  includes:   1.  Glimepiride 4 mg daily.  2.  Cozaar 100 mg daily.  3.  Actos 15 mg daily.  4.  Lasix 20 mg daily, probably can be increased to 40 mg daily.  5.  Aspirin 81 mg daily.  6.  Prilosec daily.  7.  Ferrous sulfate 325 mg b.i.d.  8.  Singulair 10 mg daily.  9.  Simvastatin 20 mg daily.  10. Advair 100/50 mcg daily.  11. Tiazac 240 mg daily.  12. NPH insulin 40 units in the a.m., 20 units in the p.m.  13. NovoLog 4 units in the a.m., 6 units in the p.m.   DISPOSITION:  The patient is being discharged to home in stable condition,  asked to follow up with primary M.D. in one week for follow-up hemoglobin.   STUDIES:  CBC:  White count 7.2, hemoglobin 9.7, platelets 261.  BMET:  Mild  azotemia with creatinine of 1.6.  Iron studies:  Serum iron 24, iron-binding  capacity 361, ferritin 16, haptoglobin 65.  Echo:  EF 55-65%, left  ventricular wall thickness mildly increased.  There was increased relative  contribution of atrial contraction to the left ventricular filling.  The  overall findings were  consistent with abnormal left ventricular relaxation.  Mild pulmonary hypertension.   HISTORY OF PRESENT ILLNESS:  A 77 year old female with the above medical  problems, who has been followed on an outpatient basis for iron-deficient  anemia without a source being identified.  The patient's hemoglobin trended  down into the 7.8 range.  At that time she was symptomatic; therefore, it  was felt that admission was deemed necessary.  Please see dictated H&P for  further details of history of present illness.   PAST MEDICAL HISTORY:  As stated above.   MEDICATIONS:  As stated above.   SOCIAL HISTORY:  Negative for tobacco, alcohol or drugs.   PAST SURGICAL HISTORY:  Per admission H&P.   HOSPITAL COURSE:  Problem 1.  The patient was admitted to a medicine floor  bed for evaluation and treatment of symptomatic anemia.  As stated in  admission H&P, the  patient has been followed on an outpatient basis, was  treated with oral iron, was seen by GI, had a barium enema and a flexible  sigmoidoscopy without revealing a source.  This showed diverticulosis.  The  patient did admit to compliance with her oral iron, however did not seem to  increase her hemoglobin.  There was not any identifiable blood per rectum or  hemoptysis.  Because of the symptomatic anemia, the patient was admitted to  the hospital.  Iron studies still are consistent with iron-deficient anemia.  It was felt that the patient may not be absorbing iron; however, a tissue  transglutaminase was ordered to rule out any kind of gluten enteropathy,  which was negative.  The patient was given IV iron as well as packed red  blood cells in the hospital.  The patient's hemoglobin is 9.7.  She feels  significantly better.  The case has been discussed with [facility], John C.  Madilyn Fireman, M.D., who will follow the patient outpatient for further endoscopy  and colonoscopy as deemed necessary.  The patient will also see a  hematologist on an  outpatient basis to see if there are any further  recommendations; however, the case was discussed with Jillyn Hidden B. Sherrill,  M.D., who recommended continued IV iron as has already been reported by  myself, and potential outpatient follow-up on an outpatient basis  An  attempt was made to make appointment during hospitalization; however, the  scheduler was not available.  Therefore the patient will need to make an  outpatient appointment through her primary M.D.   Problem 2.  LOWER EXTREMITY EDEMA:  This was evaluated.  The patient did  have an echo that showed normal LV function and, in fact, shows somewhat  hyperdynamic if not stiff LV consistent with diastolic dysfunction.  Also  showed some mild pulmonary hypertension, probably related to her sleep  apnea.  Recommend continued current treatments of Lasix and calcium channel  blocker.   The patient has several other chronic medical problems, which are stable.  At this time she is found to be stable for discharge.      Deirdre Peer. Polite, M.D.  Electronically Signed     RDP/MEDQ  D:  10/05/2004  T:  10/05/2004  Job:  [id number]   cc:   Vikki Ports, M.D.  Fax: [phone]

## 2010-05-24 NOTE — H&P (Signed)
[name], [name]                         ACCOUNT NO.:  [id number]   MEDICAL RECORD NO.:  [id number]                   PATIENT TYPE:  INP   LOCATION:  5511                                 FACILITY:  [facility]   PHYSICIAN:  Candyce Churn, M.D.          DATE OF BIRTH:  Aug 03, 1932   DATE OF ADMISSION:  04/25/2002  DATE OF DISCHARGE:                                HISTORY & PHYSICAL   CHIEF COMPLAINT:  Shortness of breath and cough.   HISTORY OF PRESENT ILLNESS:  The patient is a pleasant 77 year old female  seen two days ago in the medical office at [facility] and told that  she had pneumonia.  She denied any fever or chills.  She has had a bad  congested cough without productive phlegm.  She was given Avalox, steroid  shot and prednisone 20 b.i.d. in addition to her regular medicines.  Since  that time, her shortness of breath has gotten much worse.  She came to the  [facility] Room and was given nebulizers and IV Lasix and felt  better.  Her BNP was less than 30.  She is admitted now for increased  shortness of breath with exertion and bronchospasm and likely asthmatic  exacerbation.   MEDICATIONS:  1. Amaryl 2 mg p.o. q.a.m. and 4 mg p.o. q.p.m.  2. Hyzaar 100/25 p.o. q.a.m.  3. Aspirin 81 mg p.o. q.a.m.  4. Lasix  20 mg p.o. q.a.m.  5. Prevacid 30 mg p.o. q.a.m.  6. Singulair 10 mg p.o. q.h.s.  7. Zocor 10 mg  p.o. q.h.s.  8. Tiazac 180 mg p.o. q.h.s.  9. Advair 100/50 one inhalation b.i.d.  10.      Ventolin inhaler two puffs p.r.n.  11.      Insulin 75/25 46 units subcu q.a.m. and 56 unit subcu q.p.m.  12.      In the last two days, she has had Avalox 400 mg a day for two days     and prednisone 20 b.i.d. for two days.   ALLERGIES:  CODEINE.   PAST MEDICAL HISTORY:  1. Type 2 diabetes mellitus.  2. Hypertension.  3. Hypercholesterolemia.  4. Asthma with asthmatic bronchitis.  5. GERD.  6. Obesity.  7. Sleep apnea diagnosed for one month and  she is on CPAP machine at home     but she is not sure of the pressures.  This is followed by Dr. Sandrea Hughs.   PAST SURGICAL HISTORY:  1. TAH/BSO.  2. Open cholecystectomy.  3. Breast reduction surgery.   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  She is married.  She has two daughters and one granddaughter.   HABITS:  No tobacco.  No alcohol.   FAMILY HISTORY:  This is significant for heart disease, hypertension,  diabetes, a history of sister with breast cancer.   REVIEW OF SYSTEMS:  Denies  chest pain, fevers, or orthopnea.  She has been  getting more short of breath and wheezing more and had a more productive  cough over the last two days.   PHYSICAL EXAMINATION:  GENERAL APPEARANCE:  A well-developed, well-  nourished, moderately obese female who was with expiratory wheezing  diffusely.  This was totally nonlabored.  VITAL SIGNS:  Temperature 98.2, blood pressure 144/52, pulse 96 and regular,  respiratory rate 24 and easy.  O2 saturation is 96% on two liters.  HEENT:  Atraumatic and normocephalic.  NECK:  Supple without JVD.  CHEST:  There are diffuse expiratory wheezes.  CARDIOVASCULAR:  Regular rate and rhythm without murmurs, rubs, or gallops.  ABDOMEN:  Soft and nontender.  Bowel sounds are normal without organomegaly.  EXTREMITIES:  No clubbing, cyanosis, or edema.  She has some changes in her  skin consistent with venostasis disease in her left lower extremity.  NEUROLOGIC:  She is oriented x3, very cooperative.  Facial nerves intact.  Cooperative. Facial nerves intact.  SKIN:  No rashes.   LABORATORY AND ACCESSORY DATA:  Chest x-ray reveals question of cardiomegaly  with bibasilar atelectasis versus infiltrate.  EKG:  Right bundle branch  block and noted rhythm strip was 94.   White count 6400, hemoglobin 10.3, platelet count 270,000.  Sodium 135,  potassium 3.9, chloride 99, bicarb 24, BUN 26, creatinine 1.4, blood sugar  361.  LFTs  normal.  BNP is less than 30.  CPK is 190, CK-MB 2.9, troponin  0.01.   ASSESSMENT:  A 77 year old female with history of asthma and this is likely  a simple asthmatic exacerbation with bronchitis, doubt congestive heart  failure with negative BNP.  Need diabetes therapy for worsening _______ and  will admit.  She may have a component of pneumonia.   PLAN:  1. Asthmatic bronchitis - Her her  IV Rocephin and Erythromycin and change     to p.o. soon.  She was bolused with IV Solu-Medrol.  2. Hypertension - Continue management.  3. Diabetes mellitus - Continue Lantus/sliding scale insulin.  4.     Gastroesophageal reflux disease - _________.  5. Sleep apnea - will continue CPAP in the hospital.  Sleep obstruction     studies.  Dr. Sherene Sires is her pulmonary physician.                                               Candyce Churn, M.D.    RNG/MEDQ  D:  04/25/2002  T:  04/25/2002  Job:  [id number]   cc:   Charlaine Dalton. Sherene Sires, M.D. [facility]   Dellis Anes. Idell Pickles, M.D.  [address]  [place]  [place] [address]  Fax: [phone]

## 2010-05-24 NOTE — Op Note (Signed)
[name], BERNI               ACCOUNT NO.:  [id number]   MEDICAL RECORD NO.:  [id number]          PATIENT TYPE:  AMB   LOCATION:  ENDO                         FACILITY:  [facility]   PHYSICIAN:  John C. Madilyn Fireman, M.D.    DATE OF BIRTH:  1932/04/25   DATE OF PROCEDURE:  03/13/2005  DATE OF DISCHARGE:                                 OPERATIVE REPORT   INDICATIONS FOR PROCEDURE:  On previous EGD, the patient had a vaguely  defined distal duodenal polyp that could not be clearly assessed with the  straight viewing scope. Biopsies showed a hyperplastic polyp but with some  atypia.  The procedure is to reassess this area with the side-viewing scope  to better notice its entire extent in relationship of the papilla of Vater  and to take further biopsies.   PROCEDURE:  The patient was placed in the left lateral decubitus position  and placed on pulse monitor with continuous low flow oxygen delivered by  nasal cannula.  She was sedated with 50 mcg IV Fentanyl and 6 mg IV Versed.  The Olympus video endoscope was advanced under direct vision into the  oropharynx and esophagus.  The Esophagus was straight and of normal caliber.  The squamocolumnar line was at 38 cm. There was no hiatal hernia, ring, or  stricture.  The stomach was entered and a small amount of liquid secretions  were suctioned from the fundus.  Retroflexed view of cardia was  unremarkable.  The fundus, body, antrum and pylorus all appeared normal.  The duodenum was entered and a polypoid mass approximately 2 cm was seen in  the second portion of duodenum distal and separate from the papilla. It was  sessile but protruded somewhat and the protruding tip was freely mobile but  there was no definite stalk.  Multiple biopsies were taken.  The scope was  then withdrawn and the patient prepared for colonoscopy. She tolerated the  procedure well and there were no immediate complications.   IMPRESSION:  Soft polypoid mass of the second  portion of the duodenum near  but not involving the patella.   PLAN:  Await all biopsy results.           ______________________________  Everardo All Madilyn Fireman, M.D.     JCH/MEDQ  D:  03/13/2005  T:  03/13/2005  Job:  [id number]   cc:   Jillyn Hidden B. Truett Perna, M.D.  Fax: [phone]   Duke Salvia Eliott Nine, M.D.  Fax: [phone]

## 2010-05-24 NOTE — Progress Notes (Signed)
HEALTHCARE                          PERIPHERAL VASCULAR OFFICE NOTE   CAYLOR, CERINO                      MRN:          [id number]  DATE:08/14/2005                            DOB:          11-07-1932    REASON FOR CONSULTATION:  The patient referred by Dr. Eliott Nine for  consideration of renal angiography.   HISTORY OF PRESENT ILLNESS:  Ms. Schubach is a 77 year old lady with progressive  renal insufficiency in the setting of approximately 15 years of hypertension  and 20 years of diabetes mellitus.  In July of 2006, her creatinine was 1.5.  By June of this year, it was 1.8.  On August 06, 2005, it was 2.4.  At that  time, hydrochlorothiazide and Cozaar were discontinued.  By August 11, 2005,  it had decreased to 1.8.  In January of this year, she had a renal  ultrasound which suggested a 7 cm left kidney and a normal-sized right  kidney.  MRA performed in January demonstrated 2 renal arteries bilaterally  with what was felt to be a severe stenosis in the superior left renal artery  and poor visualization of the right renal arteries.  No renal sizes were  reported on the MRI.  However, by my measurement, both kidneys measure  greater than 10 cm on the MRA which was performed after the renal  ultrasound.   PAST MEDICAL HISTORY:  1. Diabetes mellitus since 1988.  2. Hypertension since approximately 1990.  3. Asthma.  4. Morbid obesity.  5. Sleep apnea.  6. Mild pulmonary hypertension.  7. Hypercholesterolemia.  8. Anemia with microcytosis.  9. Stage III chronic kidney disease.  10.Status post hysterectomy.  11.Status post breast reduction.  12.Status post cholecystectomy.   ALLERGIES:  CODEINE.   CURRENT MEDICATIONS:  1. Diltiazem XT 360 mg per day.  2. Glimepiride 2 mg per day.  3. Lasix 40 mg per day.  4. Aspirin 81 mg per day.  5. Actos 15 mg per day.  6. Prilosec 20 mg per day.  7. Iron sulfate 325 mg twice per day.  8. Advair  twice per day.  9. Optivar eye drops p.r.n.  10.Enterra 130 mg per day.  11.Procrit once per week.  12.Singulair 10 mg per day.  13.Insulin 4 regular and 38 NPH in the morning, and 6 regular and 28 NPH      in the evening.  14.Colace.   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  She is married to  a [employer] and has moved a good deal because of his occupation.  She enjoys reading and crossword puzzles.  She gets no exercises.  She  smoked minimally in her 18s.  Denies alcohol and illicit drug use.   FAMILY HISTORY:  Father died of a myocardial infarction at 34.  Mother died  at 80 of asthma.  Two sisters are alive and well, both with breast cancer.  Two brothers are alive with hypertension, one of whom also has diabetes.  Her 2 daughters have asthma.   REVIEW OF SYSTEMS:  Remarkable for wearing glasses.  Occasional dyspnea that  she attributes to her asthma.  Occasional lower extremity edema bilaterally.  Review of systems is otherwise negative in detail, except as above.   PHYSICAL EXAMINATION:  GENERAL:  She is a morbidly obese, otherwise well-  appearing woman in no distress.  VITAL SIGNS:  Heart rate of 80, blood pressure 134/80 on the right and  136/82 on the left.  She is 5 feet 8 inches tall and weighs 305 pounds.  HEENT:  Normal.  SKIN:  Normal.  MUSCULOSKELETAL:  Normal.  NECK:  She has no jugular venous distention, thyromegaly or lymphadenopathy.  LUNGS:  Clear to auscultation.  Respiratory effort is normal.  She has a non-  displaced point of maximal cardiac impulse.  There is a regular rate and  rhythm without murmur, rub, or gallop.  ABDOMEN:  Soft, nondistended, nontender.  There is no hepatosplenomegaly.  No evident abdominal bruit, though the exam is complicated by habitus.  EXTREMITIES:  Warm without clubbing, cyanosis, edema or ulceration.  Carotid  pulses are 2+ bilaterally with a bruit on the right.  Femoral pulses are 2+  without  bruit.  NEUROLOGIC:  She is alert and oriented x3 with normal affect.  Neurologic  exam is grossly normal.   STUDIES:  Creatinines detailed in HPI.  I reviewed the MRI and renal  ultrasound personally.  The kidneys appear to be equal and approximately 11  cm in greatest dimension bilaterally.  On the ultrasound, the left kidney  was poorly visualized, perhaps related to her habitus.   IMPRESSION/RECOMMENDATIONS:  Ms. Hugh is a 77 year old lady with long-  standing hypertension and diabetes who presents with worsening renal  dysfunction.  The creatinine was recently as high as 2.4 but improved to 1.8  with cessation of hydrochlorothiazide and Cozaar.  MRI 7 months ago raised  concern for renal artery stenosis.  However, it appears that one of the  major drivers of concern about renal artery stenosis was the ultrasound  findings of asymmetry in  the kidneys.  I think this is not a true findings, based on my review of the  MRA.  I will plan on discussing this with Dr. Eliott Nine before making any  further plans.                                   Salvadore Farber, MD   WED/MedQ  DD:  08/14/2005  DT:  08/14/2005  Job #:  [id number]   cc:   Duke Salvia. Eliott Nine, MD

## 2010-05-24 NOTE — Discharge Summary (Signed)
[name], [name]                         ACCOUNT NO.:  [id number]   MEDICAL RECORD NO.:  [id number]                   PATIENT TYPE:  INP   LOCATION:  5511                                 FACILITY:  [facility]   PHYSICIAN:  [name] Quarry, MD                   DATE OF BIRTH:  06/05/1932   DATE OF ADMISSION:  04/25/2002  DATE OF DISCHARGE:  05/02/2002                                 DISCHARGE SUMMARY   PRIMARY CARE PHYSICIAN:  [name], M.D.   DISCHARGE DIAGNOSES:  1. Acute exacerbation of asthmatic bronchitis.  2. Diabetes mellitus.  3. Hypertension.  4. Hypercholesterolemia.  5. Gastrointestinal reflux disease.  6. Obesity.  7. Sleep apnea on continuous positive airway pressure at home.   DISCHARGE MEDICATIONS:  1. Prednisone 10 mg three tablets x 1 day, then two tablets x 3 days, then     one tablet for three days, and then stop.  2. Ceftin 500 mg one twice daily for four days.  3. Aspirin 81 mg daily.  4. Prevacid 30 mg daily.  5. Singulair 10 mg at bedtime.  6. Zocor 10 mg at bedtime.  7. Tiazac 180 mg daily.  8. Advair 100/50 Diskus one puff twice daily.  9. Amaryl 2 mg every morning and 4 mg at night.  10.      Hyzaar 100/25 one every morning.  11.      Insulin 75/25 38 units every morning and 46 units every evening.  12.      Lasix 20 mg every morning.   PROCEDURES AND STUDIES:  1. A chest x-ray on April 25, 2002, revealed cardiomegaly and pulmonary     vascular congestion, left lower lung atelectasis/airspace disease, and     bibasilar atelectasis.  2. A chest x-ray on April 27, 2002, revealed cardiomegaly with mild     bibasilar interstitial accentuation unchanged.  3. A chest x-ray on April 30, 2002, revealed stable cardiomegaly and chronic     accentuation of the basilar interstitial markings.  4. A 12-lead EKG on April 25, 2002, revealed normal sinus rhythm and     ventricular rate 94.   LABORATORY DATA:  Sodium 143, potassium 3.6, chloride 101, CO2  34, glucose  118, BUN 26, creatinine 1.1, calcium 9.0.  WBC count 10.3, RBC 4.43,  hemoglobin 11.5, hematocrit 34.7, platelets 233.  BNP less than 30.   DISPOSITION:  The patient will be discharged home.   CONDITION ON DISCHARGE:  Stable.   HISTORY OF PRESENT ILLNESS:  This is a 77 year old female with a history of  asthmatic bronchitis, who was seen two days prior to admission at [facility] and was told that she had pneumonia.  The patient had had a bad  congested cough without productive phlegm.  At that time, she was given  Avelox, a steroid shot, and prednisone 20 mg twice a day in  addition to her  regular medications.  Since that time, the patient reported increased  shortness of breath and she presented to the [facility]  Emergency Room on April 25, 2002.  She was given nebulizers and IV Lasix  with good response.  The patient denied any fever or chills.  Her BNP on  admission was less than 30.  The initial examination noted the patient to be  afebrile, vital signs were stable, the O2 saturation was 96% on 2 L, and she  had diffuse expiratory wheezes.  The patient was admitted for further  evaluation and treatment.   HOSPITAL COURSE:  #1 - ASTHMATIC BRONCHITIS:  The initial chest x-ray was as  noted above.  The white blood cell count was 64,000.  The patient was  afebrile.  She was admitted and started on IV Rocephin, azithromycin, and IV  steroids along with nebulizers.  The patient progressed slowly throughout  her hospitalization.  Her IV medications were changed to p.o.  Her steroids  were also changed to p.o. and a taper was started prior to discharge.  On  the day of discharge, the patient had scant occasional expiratory wheeze.  Respiratory effort has improved greatly.  The O2 saturation was 97% on room  air.  She was discharged with an additional four days of Ceftin and a  prednisone taper.   #2 - DIABETES MELLITUS:  The patient is a known  diabetic and was initially  kept on her usual regimen of 75/25 Insulin 46 units and 56 units at night.  Her p.o. intake was decreased during her hospitalization and therefore she  had a couple of hypoglycemic episodes.  Her insulin was adjusted prior to  discharge.  She is being discharged on 75/25 Insulin 38 units in the morning  and 46 units in the evening.  The patient has been instructed to keep a  record of her blood sugars.  She is being followed by [name], M.D.  She  has been instructed to call his office if her blood sugars become elevated.   #3 - HYPERTENSION:  The patient did have some elevated blood pressure  readings during the hospitalization.  Her Lasix was restarted at her  previous dosage of 20 mg daily in addition to her other outpatient  medications and her blood pressure responded.  At discharge, her BP is  128/51.   #4 - HYPERCHOLESTEROLEMIA:  Outpatient medications were continued as usual.    FOLLOW-UP:  The patient has been instructed to follow up with her primary  care physician in approximately one week and to follow up with her  pulmonologist, [name], M.D., in one to two weeks.     [name] [place], NP                      [name] Quarry, MD    SJ/MEDQ  D:  05/02/2002  T:  05/02/2002  Job:  [id number]   cc:   [name] Anes. Idell [name], M.D.  [address]  [place]  [place] [address]  Fax: [phone]   [name], M.D. [facility]   [name], M.D.  [address], [address]  [place]  [place] [address]  Fax: [phone]

## 2010-05-28 ENCOUNTER — Other Ambulatory Visit: Payer: Self-pay | Admitting: Oncology

## 2010-05-28 ENCOUNTER — Encounter (HOSPITAL_BASED_OUTPATIENT_CLINIC_OR_DEPARTMENT_OTHER): Payer: Medicare Other | Admitting: Oncology

## 2010-05-28 DIAGNOSIS — N289 Disorder of kidney and ureter, unspecified: Secondary | ICD-10-CM

## 2010-05-28 DIAGNOSIS — D649 Anemia, unspecified: Secondary | ICD-10-CM

## 2010-05-28 LAB — CBC WITH DIFFERENTIAL/PLATELET
Eosinophils Absolute: 0.2 10*3/uL (ref 0.0–0.5)
MONO#: 0.3 10*3/uL (ref 0.1–0.9)
MONO%: 6.3 % (ref 0.0–14.0)
NEUT#: 3.5 10*3/uL (ref 1.5–6.5)
RBC: 3.49 10*6/uL — ABNORMAL LOW (ref 3.70–5.45)
RDW: 16.2 % — ABNORMAL HIGH (ref 11.2–14.5)
WBC: 5.5 10*3/uL (ref 3.9–10.3)
lymph#: 1.5 10*3/uL (ref 0.9–3.3)

## 2010-06-25 ENCOUNTER — Other Ambulatory Visit: Payer: Self-pay | Admitting: Oncology

## 2010-06-25 ENCOUNTER — Encounter (HOSPITAL_BASED_OUTPATIENT_CLINIC_OR_DEPARTMENT_OTHER): Payer: Medicare Other | Admitting: Oncology

## 2010-06-25 DIAGNOSIS — N189 Chronic kidney disease, unspecified: Secondary | ICD-10-CM

## 2010-06-25 DIAGNOSIS — D631 Anemia in chronic kidney disease: Secondary | ICD-10-CM

## 2010-06-25 DIAGNOSIS — N289 Disorder of kidney and ureter, unspecified: Secondary | ICD-10-CM

## 2010-06-25 DIAGNOSIS — I10 Essential (primary) hypertension: Secondary | ICD-10-CM

## 2010-06-25 DIAGNOSIS — E119 Type 2 diabetes mellitus without complications: Secondary | ICD-10-CM

## 2010-06-25 DIAGNOSIS — D649 Anemia, unspecified: Secondary | ICD-10-CM

## 2010-06-25 LAB — CBC WITH DIFFERENTIAL/PLATELET
Eosinophils Absolute: 0.2 10*3/uL (ref 0.0–0.5)
HGB: 9.8 g/dL — ABNORMAL LOW (ref 11.6–15.9)
LYMPH%: 25.8 % (ref 14.0–49.7)
MONO#: 0.5 10*3/uL (ref 0.1–0.9)
NEUT#: 4.3 10*3/uL (ref 1.5–6.5)
Platelets: 213 10*3/uL (ref 145–400)
RBC: 3.62 10*6/uL — ABNORMAL LOW (ref 3.70–5.45)
WBC: 6.8 10*3/uL (ref 3.9–10.3)

## 2010-07-23 ENCOUNTER — Other Ambulatory Visit: Payer: Self-pay | Admitting: Oncology

## 2010-07-23 ENCOUNTER — Encounter (HOSPITAL_BASED_OUTPATIENT_CLINIC_OR_DEPARTMENT_OTHER): Payer: Medicare Other | Admitting: Oncology

## 2010-07-23 DIAGNOSIS — D649 Anemia, unspecified: Secondary | ICD-10-CM

## 2010-07-23 DIAGNOSIS — N289 Disorder of kidney and ureter, unspecified: Secondary | ICD-10-CM

## 2010-07-23 LAB — CBC WITH DIFFERENTIAL/PLATELET
Eosinophils Absolute: 0.3 10*3/uL (ref 0.0–0.5)
HCT: 29.3 % — ABNORMAL LOW (ref 34.8–46.6)
LYMPH%: 31.6 % (ref 14.0–49.7)
MCHC: 33.2 g/dL (ref 31.5–36.0)
MONO#: 0.4 10*3/uL (ref 0.1–0.9)
NEUT#: 3.8 10*3/uL (ref 1.5–6.5)
NEUT%: 58.4 % (ref 38.4–76.8)
Platelets: 202 10*3/uL (ref 145–400)
WBC: 6.5 10*3/uL (ref 3.9–10.3)

## 2010-07-23 LAB — IRON AND TIBC: %SAT: 18 % — ABNORMAL LOW (ref 20–55)

## 2010-07-23 LAB — FERRITIN: Ferritin: 325 ng/mL — ABNORMAL HIGH (ref 10–291)

## 2010-08-20 ENCOUNTER — Encounter (HOSPITAL_BASED_OUTPATIENT_CLINIC_OR_DEPARTMENT_OTHER): Payer: Medicare Other | Admitting: Oncology

## 2010-08-20 ENCOUNTER — Other Ambulatory Visit: Payer: Self-pay | Admitting: Oncology

## 2010-08-20 DIAGNOSIS — N289 Disorder of kidney and ureter, unspecified: Secondary | ICD-10-CM

## 2010-08-20 DIAGNOSIS — D649 Anemia, unspecified: Secondary | ICD-10-CM

## 2010-08-20 LAB — CBC WITH DIFFERENTIAL/PLATELET
Basophils Absolute: 0 10*3/uL (ref 0.0–0.1)
Eosinophils Absolute: 0.3 10*3/uL (ref 0.0–0.5)
HGB: 10.3 g/dL — ABNORMAL LOW (ref 11.6–15.9)
NEUT#: 3 10*3/uL (ref 1.5–6.5)
RDW: 15.7 % — ABNORMAL HIGH (ref 11.2–14.5)
WBC: 5.8 10*3/uL (ref 3.9–10.3)
lymph#: 2 10*3/uL (ref 0.9–3.3)

## 2010-09-17 ENCOUNTER — Encounter (HOSPITAL_BASED_OUTPATIENT_CLINIC_OR_DEPARTMENT_OTHER): Payer: Medicare Other | Admitting: Oncology

## 2010-09-17 ENCOUNTER — Other Ambulatory Visit: Payer: Self-pay | Admitting: Oncology

## 2010-09-17 DIAGNOSIS — D649 Anemia, unspecified: Secondary | ICD-10-CM

## 2010-09-17 DIAGNOSIS — N289 Disorder of kidney and ureter, unspecified: Secondary | ICD-10-CM

## 2010-09-17 LAB — CBC WITH DIFFERENTIAL/PLATELET
Basophils Absolute: 0 10*3/uL (ref 0.0–0.1)
Eosinophils Absolute: 0.3 10*3/uL (ref 0.0–0.5)
HGB: 10.9 g/dL — ABNORMAL LOW (ref 11.6–15.9)
MCV: 83.7 fL (ref 79.5–101.0)
MONO#: 0.5 10*3/uL (ref 0.1–0.9)
NEUT#: 4.1 10*3/uL (ref 1.5–6.5)
RDW: 16 % — ABNORMAL HIGH (ref 11.2–14.5)
WBC: 6.7 10*3/uL (ref 3.9–10.3)
lymph#: 1.9 10*3/uL (ref 0.9–3.3)
nRBC: 0 % (ref 0–0)

## 2010-10-15 ENCOUNTER — Other Ambulatory Visit: Payer: Self-pay | Admitting: Oncology

## 2010-10-15 ENCOUNTER — Encounter (HOSPITAL_BASED_OUTPATIENT_CLINIC_OR_DEPARTMENT_OTHER): Payer: Medicare Other | Admitting: Oncology

## 2010-10-15 DIAGNOSIS — D649 Anemia, unspecified: Secondary | ICD-10-CM

## 2010-10-15 DIAGNOSIS — N289 Disorder of kidney and ureter, unspecified: Secondary | ICD-10-CM

## 2010-10-15 LAB — CBC WITH DIFFERENTIAL/PLATELET
Eosinophils Absolute: 0.3 10*3/uL (ref 0.0–0.5)
HCT: 32.3 % — ABNORMAL LOW (ref 34.8–46.6)
HGB: 10.7 g/dL — ABNORMAL LOW (ref 11.6–15.9)
LYMPH%: 31.8 % (ref 14.0–49.7)
MONO#: 0.4 10*3/uL (ref 0.1–0.9)
NEUT#: 3.7 10*3/uL (ref 1.5–6.5)
NEUT%: 56.6 % (ref 38.4–76.8)
Platelets: 189 10*3/uL (ref 145–400)
WBC: 6.6 10*3/uL (ref 3.9–10.3)
lymph#: 2.1 10*3/uL (ref 0.9–3.3)

## 2010-10-26 ENCOUNTER — Other Ambulatory Visit: Payer: Self-pay | Admitting: Oncology

## 2010-10-26 DIAGNOSIS — D631 Anemia in chronic kidney disease: Secondary | ICD-10-CM

## 2010-10-26 DIAGNOSIS — N183 Chronic kidney disease, stage 3 unspecified: Secondary | ICD-10-CM | POA: Insufficient documentation

## 2010-10-26 MED ORDER — EPOETIN ALFA 40000 UNIT/ML IJ SOLN: 60000.0000 [IU] | Freq: Once | INTRAMUSCULAR | Status: DC

## 2010-11-12 ENCOUNTER — Other Ambulatory Visit: Payer: Self-pay | Admitting: Oncology

## 2010-11-12 ENCOUNTER — Other Ambulatory Visit (HOSPITAL_BASED_OUTPATIENT_CLINIC_OR_DEPARTMENT_OTHER): Payer: Medicare Other | Admitting: Lab

## 2010-11-12 ENCOUNTER — Ambulatory Visit (HOSPITAL_BASED_OUTPATIENT_CLINIC_OR_DEPARTMENT_OTHER): Payer: Medicare Other

## 2010-11-12 VITALS — BP 119/53 | HR 51 | Temp 98.2°F

## 2010-11-12 DIAGNOSIS — N189 Chronic kidney disease, unspecified: Secondary | ICD-10-CM

## 2010-11-12 DIAGNOSIS — N289 Disorder of kidney and ureter, unspecified: Secondary | ICD-10-CM

## 2010-11-12 DIAGNOSIS — D631 Anemia in chronic kidney disease: Secondary | ICD-10-CM

## 2010-11-12 DIAGNOSIS — D649 Anemia, unspecified: Secondary | ICD-10-CM

## 2010-11-12 LAB — CBC WITH DIFFERENTIAL/PLATELET
Eosinophils Absolute: 0.3 10*3/uL (ref 0.0–0.5)
HGB: 10.8 g/dL — ABNORMAL LOW (ref 11.6–15.9)
LYMPH%: 33.1 % (ref 14.0–49.7)
MCV: 83.6 fL (ref 79.5–101.0)
MONO#: 0.5 10*3/uL (ref 0.1–0.9)
NEUT%: 55.3 % (ref 38.4–76.8)
RBC: 4.09 10*6/uL (ref 3.70–5.45)
lymph#: 2.3 10*3/uL (ref 0.9–3.3)
nRBC: 0 % (ref 0–0)

## 2010-11-12 MED ORDER — EPOETIN ALFA 20000 UNIT/ML IJ SOLN
60000.0000 [IU] | Freq: Once | INTRAMUSCULAR | Status: AC
  Administered 2010-11-12: 60000 [IU] via SUBCUTANEOUS
  Filled 2010-11-12: qty 3

## 2010-12-09 ENCOUNTER — Encounter: Payer: Self-pay | Admitting: *Deleted

## 2010-12-10 ENCOUNTER — Other Ambulatory Visit (HOSPITAL_BASED_OUTPATIENT_CLINIC_OR_DEPARTMENT_OTHER): Payer: Medicare Other | Admitting: Lab

## 2010-12-10 ENCOUNTER — Telehealth: Payer: Self-pay | Admitting: Oncology

## 2010-12-10 ENCOUNTER — Other Ambulatory Visit: Payer: Self-pay | Admitting: Oncology

## 2010-12-10 ENCOUNTER — Ambulatory Visit: Payer: Medicare Other

## 2010-12-10 DIAGNOSIS — D649 Anemia, unspecified: Secondary | ICD-10-CM

## 2010-12-10 DIAGNOSIS — N289 Disorder of kidney and ureter, unspecified: Secondary | ICD-10-CM

## 2010-12-10 LAB — CBC WITH DIFFERENTIAL/PLATELET
Basophils Absolute: 0 10*3/uL (ref 0.0–0.1)
EOS%: 4.8 % (ref 0.0–7.0)
Eosinophils Absolute: 0.4 10*3/uL (ref 0.0–0.5)
HCT: 34.8 % (ref 34.8–46.6)
HGB: 11.2 g/dL — ABNORMAL LOW (ref 11.6–15.9)
MCH: 26.9 pg (ref 25.1–34.0)
NEUT#: 4.7 10*3/uL (ref 1.5–6.5)
NEUT%: 64.7 % (ref 38.4–76.8)
RDW: 15.9 % — ABNORMAL HIGH (ref 11.2–14.5)
lymph#: 1.8 10*3/uL (ref 0.9–3.3)

## 2010-12-10 NOTE — Progress Notes (Signed)
Patient will not receive Procrit due to hemoglobin of 11.2.

## 2010-12-10 NOTE — Telephone Encounter (Signed)
Pt came in today to check on her schedule today as was given appts for jan thru feb

## 2011-01-05 ENCOUNTER — Other Ambulatory Visit: Payer: Self-pay | Admitting: Oncology

## 2011-01-08 ENCOUNTER — Other Ambulatory Visit: Payer: Self-pay | Admitting: Oncology

## 2011-01-08 ENCOUNTER — Ambulatory Visit (HOSPITAL_BASED_OUTPATIENT_CLINIC_OR_DEPARTMENT_OTHER): Payer: Medicare Other

## 2011-01-08 ENCOUNTER — Other Ambulatory Visit: Payer: Medicare Other | Admitting: Lab

## 2011-01-08 VITALS — BP 114/48 | HR 59 | Temp 98.2°F

## 2011-01-08 DIAGNOSIS — N189 Chronic kidney disease, unspecified: Secondary | ICD-10-CM

## 2011-01-08 DIAGNOSIS — D631 Anemia in chronic kidney disease: Secondary | ICD-10-CM

## 2011-01-08 DIAGNOSIS — N039 Chronic nephritic syndrome with unspecified morphologic changes: Secondary | ICD-10-CM

## 2011-01-08 LAB — CBC WITH DIFFERENTIAL/PLATELET
Basophils Absolute: 0.1 10*3/uL (ref 0.0–0.1)
EOS%: 5.5 % (ref 0.0–7.0)
Eosinophils Absolute: 0.4 10*3/uL (ref 0.0–0.5)
HGB: 10.5 g/dL — ABNORMAL LOW (ref 11.6–15.9)
LYMPH%: 30.1 % (ref 14.0–49.7)
MCH: 26.9 pg (ref 25.1–34.0)
MCV: 84.6 fL (ref 79.5–101.0)
MONO%: 6 % (ref 0.0–14.0)
Platelets: 177 10*3/uL (ref 145–400)
RDW: 16 % — ABNORMAL HIGH (ref 11.2–14.5)

## 2011-01-08 MED ORDER — EPOETIN ALFA 20000 UNIT/ML IJ SOLN
60000.0000 [IU] | Freq: Once | INTRAMUSCULAR | Status: AC
  Administered 2011-01-08: 60000 [IU] via SUBCUTANEOUS
  Filled 2011-01-08: qty 3

## 2011-01-23 ENCOUNTER — Telehealth: Payer: Self-pay | Admitting: Oncology

## 2011-01-23 NOTE — Telephone Encounter (Signed)
per gbs move md visit from 1/31 to 2/26 inj,lm with this info and mailed to pt   (this info in pt on 1/31 at 11:30)  aom

## 2011-02-06 ENCOUNTER — Ambulatory Visit: Payer: Medicare Other | Admitting: Oncology

## 2011-02-06 ENCOUNTER — Other Ambulatory Visit (HOSPITAL_BASED_OUTPATIENT_CLINIC_OR_DEPARTMENT_OTHER): Payer: Medicare Other | Admitting: Lab

## 2011-02-06 ENCOUNTER — Ambulatory Visit (HOSPITAL_BASED_OUTPATIENT_CLINIC_OR_DEPARTMENT_OTHER): Payer: Medicare Other

## 2011-02-06 VITALS — BP 120/62 | HR 56 | Temp 97.5°F

## 2011-02-06 DIAGNOSIS — D631 Anemia in chronic kidney disease: Secondary | ICD-10-CM

## 2011-02-06 DIAGNOSIS — N289 Disorder of kidney and ureter, unspecified: Secondary | ICD-10-CM

## 2011-02-06 DIAGNOSIS — N189 Chronic kidney disease, unspecified: Secondary | ICD-10-CM

## 2011-02-06 DIAGNOSIS — D649 Anemia, unspecified: Secondary | ICD-10-CM

## 2011-02-06 LAB — CBC WITH DIFFERENTIAL/PLATELET
BASO%: 0.3 % (ref 0.0–2.0)
EOS%: 3.1 % (ref 0.0–7.0)
HCT: 32.2 % — ABNORMAL LOW (ref 34.8–46.6)
LYMPH%: 27.6 % (ref 14.0–49.7)
MCH: 27.8 pg (ref 25.1–34.0)
MCHC: 33 g/dL (ref 31.5–36.0)
MCV: 84.2 fL (ref 79.5–101.0)
MONO#: 0.3 10*3/uL (ref 0.1–0.9)
MONO%: 5.2 % (ref 0.0–14.0)
NEUT%: 63.8 % (ref 38.4–76.8)
Platelets: 211 10*3/uL (ref 145–400)
RBC: 3.82 10*6/uL (ref 3.70–5.45)
WBC: 6.1 10*3/uL (ref 3.9–10.3)

## 2011-02-06 MED ORDER — EPOETIN ALFA 20000 UNIT/ML IJ SOLN
60000.0000 [IU] | Freq: Once | INTRAMUSCULAR | Status: AC
  Administered 2011-02-06: 60000 [IU] via SUBCUTANEOUS
  Filled 2011-02-06: qty 3

## 2011-02-11 ENCOUNTER — Other Ambulatory Visit: Payer: Medicare Other | Admitting: Lab

## 2011-02-11 ENCOUNTER — Ambulatory Visit: Payer: Medicare Other

## 2011-03-04 ENCOUNTER — Ambulatory Visit (HOSPITAL_BASED_OUTPATIENT_CLINIC_OR_DEPARTMENT_OTHER): Payer: Medicare Other | Admitting: Nurse Practitioner

## 2011-03-04 ENCOUNTER — Ambulatory Visit: Payer: Medicare Other | Admitting: Oncology

## 2011-03-04 ENCOUNTER — Other Ambulatory Visit (HOSPITAL_BASED_OUTPATIENT_CLINIC_OR_DEPARTMENT_OTHER): Payer: Medicare Other | Admitting: Lab

## 2011-03-04 ENCOUNTER — Telehealth: Payer: Self-pay | Admitting: Oncology

## 2011-03-04 ENCOUNTER — Ambulatory Visit: Payer: Medicare Other

## 2011-03-04 VITALS — BP 145/52 | HR 61 | Temp 98.1°F | Ht 68.0 in | Wt 280.4 lb

## 2011-03-04 DIAGNOSIS — D631 Anemia in chronic kidney disease: Secondary | ICD-10-CM

## 2011-03-04 DIAGNOSIS — D649 Anemia, unspecified: Secondary | ICD-10-CM

## 2011-03-04 LAB — CBC WITH DIFFERENTIAL/PLATELET
Basophils Absolute: 0 10*3/uL (ref 0.0–0.1)
Eosinophils Absolute: 0.3 10*3/uL (ref 0.0–0.5)
HCT: 34.8 % (ref 34.8–46.6)
HGB: 11.1 g/dL — ABNORMAL LOW (ref 11.6–15.9)
LYMPH%: 33.2 % (ref 14.0–49.7)
MCV: 85.9 fL (ref 79.5–101.0)
MONO#: 0.4 10*3/uL (ref 0.1–0.9)
MONO%: 6.3 % (ref 0.0–14.0)
NEUT#: 3.7 10*3/uL (ref 1.5–6.5)
Platelets: 207 10*3/uL (ref 145–400)
WBC: 6.5 10*3/uL (ref 3.9–10.3)
nRBC: 0 % (ref 0–0)

## 2011-03-04 NOTE — Telephone Encounter (Signed)
apts made and printed for pt for lab inj and md vist for 7.2013   aom

## 2011-03-04 NOTE — Progress Notes (Signed)
OFFICE PROGRESS NOTE  Interval history:  [name] is a 78 year old woman with chronic anemia secondary to renal insufficiency. She is currently maintained on Procrit every 4 weeks. She is seen today for scheduled followup.  [name] reports that overall she feels well. No interim illnesses or infections. She continues to feel fatigued. She reports stable dyspnea on exertion.   Objective: Blood pressure 145/52, pulse 61, temperature 98.1 F (36.7 C), temperature source Oral, height 5\' 8"  (1.727 m), weight 280 lb 6.4 oz (127.189 kg).  Oropharynx is without thrush or ulceration. Lungs are clear. Regular cardiac rhythm. Abdomen is soft and nontender. Obese. No obvious organomegaly. Chronic stasis changes at the lower legs bilaterally. Minimal lower leg edema.  Lab Results: Lab Results  Component Value Date   WBC 6.5 03/04/2011   HGB 11.1* 03/04/2011   HCT 34.8 03/04/2011   MCV 85.9 03/04/2011   PLT 207 03/04/2011    Chemistry:    Chemistry      Component Value Date/Time   NA 141 02/07/2010 0505   K 3.8 02/07/2010 0505   CL 100 02/07/2010 0505   CO2 30 02/07/2010 0505   BUN 44* 02/07/2010 0505   CREATININE 1.78* 02/07/2010 0505      Component Value Date/Time   CALCIUM 8.9 02/07/2010 0505   ALKPHOS 65 02/06/2010 0730   AST 18 02/06/2010 0730   ALT 14 02/06/2010 0730   BILITOT 0.4 02/06/2010 0730       Studies/Results: No results found.  Medications: I have reviewed the patient's current medications.  Assessment/Plan:  1. Chronic anemia secondary to renal insufficiency, stable.  The goal is to maintain the hemoglobin between 10 and 11.   2. Renal insufficiency, followed by Dr. Eliott Nine. 3. Hypothyroidism. 4. Chronic fatigue. 5. History of a duodenal polyp, status post a biopsy 06/12/2005 with the pathology confirming chronic duodenitis. 6. Diabetes. 7. Hypertension. 8. Asthma. 9. Congestive heart failure.   Disposition-[name] appears stable. The hemoglobin today is slightly above the goal  range. Today's injection will be held. She will return for labs and an injection in 3 weeks. She will then resume Procrit on a 4 week schedule. She will return for a followup visit with Dr. Truett Perna in July of this year. She will contact the office the interim with any problems.  Plan reviewed with Dr. Truett Perna.  Lonna Cobb ANP/GNP-BC

## 2011-03-07 ENCOUNTER — Other Ambulatory Visit: Payer: Self-pay | Admitting: *Deleted

## 2011-03-07 MED ORDER — POLYETHYLENE GLYCOL 3350 17 G PO PACK: 17.0000 g | PACK | ORAL | Status: DC | PRN

## 2011-03-11 ENCOUNTER — Ambulatory Visit: Payer: Medicare Other | Admitting: Nurse Practitioner

## 2011-03-25 ENCOUNTER — Ambulatory Visit (HOSPITAL_BASED_OUTPATIENT_CLINIC_OR_DEPARTMENT_OTHER): Payer: Medicare Other

## 2011-03-25 ENCOUNTER — Other Ambulatory Visit (HOSPITAL_BASED_OUTPATIENT_CLINIC_OR_DEPARTMENT_OTHER): Payer: Medicare Other | Admitting: Lab

## 2011-03-25 ENCOUNTER — Other Ambulatory Visit: Payer: Medicare Other | Admitting: Lab

## 2011-03-25 VITALS — BP 125/54 | HR 58 | Temp 97.6°F

## 2011-03-25 DIAGNOSIS — N289 Disorder of kidney and ureter, unspecified: Secondary | ICD-10-CM

## 2011-03-25 DIAGNOSIS — D649 Anemia, unspecified: Secondary | ICD-10-CM

## 2011-03-25 DIAGNOSIS — N189 Chronic kidney disease, unspecified: Secondary | ICD-10-CM

## 2011-03-25 LAB — CBC WITH DIFFERENTIAL/PLATELET
BASO%: 0.4 % (ref 0.0–2.0)
EOS%: 2.6 % (ref 0.0–7.0)
HCT: 33.1 % — ABNORMAL LOW (ref 34.8–46.6)
MCH: 27.1 pg (ref 25.1–34.0)
MCHC: 31.7 g/dL (ref 31.5–36.0)
MONO#: 0.4 10*3/uL (ref 0.1–0.9)
NEUT%: 63.8 % (ref 38.4–76.8)
RBC: 3.87 10*6/uL (ref 3.70–5.45)
WBC: 7.3 10*3/uL (ref 3.9–10.3)
lymph#: 2 10*3/uL (ref 0.9–3.3)
nRBC: 0 % (ref 0–0)

## 2011-03-25 MED ORDER — EPOETIN ALFA 20000 UNIT/ML IJ SOLN
60000.0000 [IU] | Freq: Once | INTRAMUSCULAR | Status: AC
  Administered 2011-03-25: 60000 [IU] via SUBCUTANEOUS
  Filled 2011-03-25: qty 3

## 2011-04-22 ENCOUNTER — Other Ambulatory Visit: Payer: Medicare Other | Admitting: Lab

## 2011-04-22 ENCOUNTER — Other Ambulatory Visit: Payer: Self-pay | Admitting: Nurse Practitioner

## 2011-04-22 ENCOUNTER — Ambulatory Visit (HOSPITAL_BASED_OUTPATIENT_CLINIC_OR_DEPARTMENT_OTHER): Payer: Medicare Other

## 2011-04-22 ENCOUNTER — Other Ambulatory Visit (HOSPITAL_BASED_OUTPATIENT_CLINIC_OR_DEPARTMENT_OTHER): Payer: Medicare Other

## 2011-04-22 VITALS — BP 142/62 | HR 51 | Temp 97.2°F

## 2011-04-22 DIAGNOSIS — N189 Chronic kidney disease, unspecified: Secondary | ICD-10-CM

## 2011-04-22 DIAGNOSIS — D649 Anemia, unspecified: Secondary | ICD-10-CM

## 2011-04-22 DIAGNOSIS — N039 Chronic nephritic syndrome with unspecified morphologic changes: Secondary | ICD-10-CM

## 2011-04-22 LAB — CBC WITH DIFFERENTIAL/PLATELET
Basophils Absolute: 0 10*3/uL (ref 0.0–0.1)
EOS%: 4.5 % (ref 0.0–7.0)
HCT: 31.9 % — ABNORMAL LOW (ref 34.8–46.6)
HGB: 10.1 g/dL — ABNORMAL LOW (ref 11.6–15.9)
MCH: 27.2 pg (ref 25.1–34.0)
MCV: 86 fL (ref 79.5–101.0)
MONO%: 7.4 % (ref 0.0–14.0)
NEUT%: 58.1 % (ref 38.4–76.8)
lymph#: 2 10*3/uL (ref 0.9–3.3)

## 2011-04-22 MED ORDER — EPOETIN ALFA 40000 UNIT/ML IJ SOLN
60000.0000 [IU] | INTRAMUSCULAR | Status: DC
  Administered 2011-04-22: 60000 [IU] via SUBCUTANEOUS
  Filled 2011-04-22: qty 2

## 2011-04-30 ENCOUNTER — Ambulatory Visit (INDEPENDENT_AMBULATORY_CARE_PROVIDER_SITE_OTHER): Payer: Medicare Other | Admitting: Internal Medicine

## 2011-04-30 DIAGNOSIS — J45909 Unspecified asthma, uncomplicated: Secondary | ICD-10-CM

## 2011-04-30 LAB — PULMONARY FUNCTION TEST

## 2011-04-30 NOTE — Progress Notes (Signed)
PFT done today. 

## 2011-05-20 ENCOUNTER — Other Ambulatory Visit: Payer: Medicare Other | Admitting: Lab

## 2011-05-20 ENCOUNTER — Ambulatory Visit: Payer: Medicare Other

## 2011-05-27 ENCOUNTER — Ambulatory Visit: Payer: Medicare Other | Admitting: Internal Medicine

## 2011-05-27 ENCOUNTER — Other Ambulatory Visit (HOSPITAL_BASED_OUTPATIENT_CLINIC_OR_DEPARTMENT_OTHER): Payer: Medicare Other | Admitting: Lab

## 2011-05-27 ENCOUNTER — Ambulatory Visit (HOSPITAL_BASED_OUTPATIENT_CLINIC_OR_DEPARTMENT_OTHER): Payer: Medicare Other

## 2011-05-27 VITALS — BP 153/62 | HR 64 | Temp 98.6°F

## 2011-05-27 DIAGNOSIS — D631 Anemia in chronic kidney disease: Secondary | ICD-10-CM

## 2011-05-27 DIAGNOSIS — D649 Anemia, unspecified: Secondary | ICD-10-CM

## 2011-05-27 DIAGNOSIS — N189 Chronic kidney disease, unspecified: Secondary | ICD-10-CM

## 2011-05-27 DIAGNOSIS — N039 Chronic nephritic syndrome with unspecified morphologic changes: Secondary | ICD-10-CM

## 2011-05-27 LAB — CBC WITH DIFFERENTIAL/PLATELET
Eosinophils Absolute: 0 10*3/uL (ref 0.0–0.5)
LYMPH%: 18 % (ref 14.0–49.7)
MONO#: 0.5 10*3/uL (ref 0.1–0.9)
NEUT#: 5.3 10*3/uL (ref 1.5–6.5)
Platelets: 195 10*3/uL (ref 145–400)
RBC: 3.86 10*6/uL (ref 3.70–5.45)
WBC: 7.2 10*3/uL (ref 3.9–10.3)
lymph#: 1.3 10*3/uL (ref 0.9–3.3)
nRBC: 0 % (ref 0–0)

## 2011-05-27 MED ORDER — EPOETIN ALFA 40000 UNIT/ML IJ SOLN
60000.0000 [IU] | INTRAMUSCULAR | Status: DC
  Administered 2011-05-27: 60000 [IU] via SUBCUTANEOUS
  Filled 2011-05-27: qty 2

## 2011-05-30 ENCOUNTER — Ambulatory Visit (INDEPENDENT_AMBULATORY_CARE_PROVIDER_SITE_OTHER): Payer: Medicare Other | Admitting: Internal Medicine

## 2011-05-30 ENCOUNTER — Encounter: Payer: Self-pay | Admitting: Internal Medicine

## 2011-05-30 VITALS — BP 124/82 | HR 59 | Temp 98.5°F | Ht 68.0 in | Wt 275.2 lb

## 2011-05-30 DIAGNOSIS — J45909 Unspecified asthma, uncomplicated: Secondary | ICD-10-CM

## 2011-05-30 DIAGNOSIS — G4730 Sleep apnea, unspecified: Secondary | ICD-10-CM

## 2011-05-30 MED ORDER — BUDESONIDE-FORMOTEROL FUMARATE 160-4.5 MCG/ACT IN AERO: INHALATION_SPRAY | RESPIRATORY_TRACT | Status: DC

## 2011-05-30 MED ORDER — OMEPRAZOLE 20 MG PO CPDR: DELAYED_RELEASE_CAPSULE | ORAL | Status: DC

## 2011-05-30 NOTE — Assessment & Plan Note (Signed)
PFT's12/20/05  ERV  12%  Target wt < 197 to get BMI < 30  Will address on next ov the impact this has on her breathing and possibly on her airway control

## 2011-05-30 NOTE — Assessment & Plan Note (Signed)
-   PFT's 12/26/03 FEV1  1.27 (53%) ratio 66 and no better p B2 with DLC0 54 corrects to 126    She is having more than one exac on avg per year and despite only GOLD II is high risk for further aecopd./ab episodes therefore by definition difficult to control symptoms  DDX of  difficult airways managment all start with A and  include Adherence, Ace Inhibitors, Acid Reflux, Active Sinus Disease, Alpha 1 Antitripsin deficiency, Anxiety masquerading as Airways dz,  ABPA,  allergy(esp in young), Aspiration (esp in elderly), Adverse effects of DPI,  Active smokers, plus two Bs  = Bronchiectasis and Beta blocker use..and one C= CHF  Adherence is always the initial "prime suspect" and is a multilayered concern that requires a "trust but verify" approach in every patient - starting with knowing how to use medications, especially inhalers, correctly, keeping up with refills and understanding the fundamental difference between maintenance and prns vs those medications only taken for a very short course and then stopped and not refilled. The proper method of use, as well as anticipated side effects, of a metered-dose inhaler are discussed and demonstrated to the patient. Improved effectiveness after extensive coaching during this visit to a level of approximately  50% so try on symbicort 160 2bid and then regroup  ? Acid reflux > max rx  ? Sinus dz> ? Needs sinus ct next ov  ? Adverse effects of advair DPI > try hfa symbicort samples x 4 weeks then return for pft's/ regroup.

## 2011-05-30 NOTE — Patient Instructions (Addendum)
Stop advair  Prilosec 20 mg Take 30- 60 min before your first and last meals of the day   Start symbicort 160 Take 2 puffs first thing in am and then another 2 puffs about 12 hours later.   Work on inhaler technique:  relax and gently blow all the way out then take a nice smooth deep breath back in, triggering the inhaler at same time you start breathing in.  Hold for up to 5 seconds if you can.  Rinse and gargle with water when done   If your mouth or throat starts to bother you,   I suggest you time the inhaler to your dental care and after using the inhaler(s) brush teeth and tongue with a baking soda containing toothpaste and when you rinse this out, gargle with it first to see if this helps your mouth and throat.     For cough mucinex dm 600mg  up to 2 every 12 hours  Please schedule a follow up office visit in 4 weeks, sooner if needed with full pft's on return if possible, if not we'll do the bedside spirometry then

## 2011-05-30 NOTE — Progress Notes (Signed)
  Subjective:    Patient ID: FINDLEY BLANKENBAKER, female    DOB: 07-Sep-1932, 78 y.o.   MRN: [id number]  HPI  66 yobf minimal remote smoker with COPD/AB only mild airflow obstruction in 2005 pulm eval  referred back to pulmonary clinic 05/30/2011 by Sigmund Hazel for refractory cough.   05/30/2011 1st pulmnary eval in emr era happy with "asthma control" until early May 2-13. In April had been maintained on advair 500 and singulair but having two bad spells a year on avg of severe refractory cough with the  last one early may  2013 p commercial flight  to [place] already rx prednisone x 12 days  And z pak x 5 days prior to OV and only a little better assoc with lots of nasal congestion but minimal slt discolored nasal secrtions/ sputum and no overt hb. Sob with minimal exertion and also at hs if doesn't use extra saba, which helps despite very poor hfa technique  Denies any obvious fluctuation of symptoms with weather or environmental changes or other aggravating or alleviating factors except as outlined above    Review of Systems  Constitutional: Negative for fever and unexpected weight change.  HENT: Positive for congestion, rhinorrhea, sneezing and postnasal drip. Negative for ear pain, nosebleeds, sore throat, trouble swallowing, dental problem and sinus pressure.   Eyes: Positive for itching. Negative for redness.  Respiratory: Positive for cough, chest tightness, shortness of breath and wheezing.   Cardiovascular: Negative for palpitations and leg swelling.  Gastrointestinal: Negative for nausea and vomiting.  Genitourinary: Negative for dysuria.  Musculoskeletal: Negative for joint swelling.  Skin: Negative for rash.  Neurological: Negative for headaches.  Hematological: Does not bruise/bleed easily.  Psychiatric/Behavioral: Negative for dysphoric mood. The patient is not nervous/anxious.        Objective:   Physical Exam Obese mod hoarse amb bf nad  Wt Readings from Last 3 Encounters:    05/30/11 275 lb 3.2 oz (124.83 kg)  03/04/11 280 lb 6.4 oz (127.189 kg)  02/27/09 298 lb (135.172 kg)    HEENT mild turbinate edema.  Oropharynx no thrush or excess pnd or cobblestoning.  No JVD or cervical adenopathy. No accessory muscle hypertrophy. Trachea midline, nl thryroid. Chest was hyperinflated by percussion with diminished breath sounds and mild exp time with mostly upper airway insp and exp  wheeze. Hoover sign positive at end inspiration. Regular rate and rhythm without murmur gallop or rub or increase P2 or edema.  Abd: no hsm, nl excursion. Ext warm without cyanosis or clubbing.        Assessment & Plan:

## 2011-06-17 ENCOUNTER — Other Ambulatory Visit: Payer: Medicare Other | Admitting: Lab

## 2011-06-17 ENCOUNTER — Other Ambulatory Visit (HOSPITAL_BASED_OUTPATIENT_CLINIC_OR_DEPARTMENT_OTHER): Payer: Medicare Other | Admitting: Lab

## 2011-06-17 ENCOUNTER — Ambulatory Visit (HOSPITAL_BASED_OUTPATIENT_CLINIC_OR_DEPARTMENT_OTHER): Payer: Medicare Other

## 2011-06-17 VITALS — BP 123/61 | HR 64

## 2011-06-17 DIAGNOSIS — N189 Chronic kidney disease, unspecified: Secondary | ICD-10-CM

## 2011-06-17 DIAGNOSIS — D631 Anemia in chronic kidney disease: Secondary | ICD-10-CM

## 2011-06-17 DIAGNOSIS — D649 Anemia, unspecified: Secondary | ICD-10-CM

## 2011-06-17 LAB — CBC WITH DIFFERENTIAL/PLATELET
Basophils Absolute: 0 10*3/uL (ref 0.0–0.1)
Eosinophils Absolute: 0.2 10*3/uL (ref 0.0–0.5)
HGB: 10.9 g/dL — ABNORMAL LOW (ref 11.6–15.9)
MCV: 86.1 fL (ref 79.5–101.0)
MONO#: 0.4 10*3/uL (ref 0.1–0.9)
MONO%: 6.1 % (ref 0.0–14.0)
NEUT#: 3.3 10*3/uL (ref 1.5–6.5)
RDW: 15.7 % — ABNORMAL HIGH (ref 11.2–14.5)
WBC: 5.8 10*3/uL (ref 3.9–10.3)

## 2011-06-17 MED ORDER — EPOETIN ALFA 40000 UNIT/ML IJ SOLN
60000.0000 [IU] | INTRAMUSCULAR | Status: DC
  Administered 2011-06-17: 60000 [IU] via SUBCUTANEOUS
  Filled 2011-06-17: qty 2

## 2011-06-30 ENCOUNTER — Encounter: Payer: Self-pay | Admitting: Internal Medicine

## 2011-06-30 ENCOUNTER — Ambulatory Visit (INDEPENDENT_AMBULATORY_CARE_PROVIDER_SITE_OTHER): Payer: Medicare Other | Admitting: Internal Medicine

## 2011-06-30 VITALS — BP 132/80 | HR 66 | Temp 98.3°F | Ht 68.0 in | Wt 285.4 lb

## 2011-06-30 DIAGNOSIS — J449 Chronic obstructive pulmonary disease, unspecified: Secondary | ICD-10-CM

## 2011-06-30 DIAGNOSIS — J45909 Unspecified asthma, uncomplicated: Secondary | ICD-10-CM

## 2011-06-30 DIAGNOSIS — K219 Gastro-esophageal reflux disease without esophagitis: Secondary | ICD-10-CM | POA: Insufficient documentation

## 2011-06-30 MED ORDER — FLUTICASONE-SALMETEROL 250-50 MCG/DOSE IN AEPB: INHALATION_SPRAY | RESPIRATORY_TRACT | Status: DC

## 2011-06-30 NOTE — Progress Notes (Signed)
Subjective:    Patient ID: [name], female    DOB: 1932/08/01    MRN: [id number]  HPI  69 yobf minimal remote smoker with COPD/AB only mild airflow obstruction in 2005 pulm eval  referred back to pulmonary clinic 05/30/2011 by Sigmund Hazel for refractory cough.   05/30/2011 1st pulmnary eval in emr era happy with "asthma control" until early May 2-13. In April had been maintained on advair 500 and singulair but having two bad spells a year on avg of severe refractory cough with the  last one early may  2013 p commercial flight  to [place] already rx prednisone x 12 days  And z pak x 5 days prior to OV and only a little better assoc with lots of nasal congestion but minimal slt discolored nasal secrtions/ sputum and no overt hb. Sob with minimal exertion and also at hs if doesn't use extra saba, which helps despite very poor hfa technique  Denies any obvious fluctuation of symptoms with weather or environmental changes or other aggravating or alleviating factors except as outlined above. rec Stop advair Prilosec 20 mg Take 30- 60 min before your first and last meals of the day  Start symbicort 160 Take 2 puffs first thing in am and then another 2 puffs about 12 hours later.  Work on inhaler technique:   For cough mucinex dm 600mg  up to 2 every 12 hours Please schedule a follow up office visit in 4 weeks, sooner if needed with full pft's on return if possible, if not we'll do the bedside spirometry then     06/30/2011 f/u ov/Nikolaos Maddocks cc doe much better and off prednisone but having trouble using symbicort and using albuterol a few times daytime only But hfa poor at baseline, hoarseness much better, no cough or purulent secretions, overt hb or reflux symptoms or variability in terms of daytime doe.  Sleeping ok without nocturnal  or early am exacerbation  of respiratory  c/o's or need for noct saba. Also denies any obvious fluctuation of symptoms with weather or environmental changes or other  aggravating or alleviating factors except as outlined above   ROS  At present no c/o's of   any significant sore throat, dysphagia, dental problems, itching, sneezing,  nasal congestion or excess/ purulent secretions, ear ache,   fever, chills, sweats, unintended wt loss, pleuritic or exertional cp, hemoptysis, palpitations, orthopnea pnd or leg swelling.  Also denies presyncope, palpitations, heartburn, abdominal pain, anorexia, nausea, vomiting, diarrhea  or change in bowel or urinary habits, change in stools or urine, dysuria,hematuria,  rash, arthralgias, visual complaints, headache, numbness weakness or ataxia or problems with walking or coordination. No noted change in mood/affect or memory.              Objective:   Physical Exam Obese min  hoarse amb bf nad Wt 06/30/2011  285   Wt Readings from Last 3 Encounters:  05/30/11 275 lb 3.2 oz (124.83 kg)  03/04/11 280 lb 6.4 oz (127.189 kg)  02/27/09 298 lb (135.172 kg)    HEENT mild turbinate edema.  Oropharynx no thrush or excess pnd or cobblestoning.  No JVD or cervical adenopathy. No accessory muscle hypertrophy. Trachea midline, nl thryroid. Chest was no signifincanlty hyperinflated by percussion with diminished breath sounds and min increased exp time with no longer upper airway insp and exp  wheeze. Hoover sign positive at end inspiration. Regular rate and rhythm without murmur gallop or rub or increase P2 or edema.  Abd:  no hsm, nl excursion. Ext warm without cyanosis or clubbing.    cxr 02/17/10 Stable enlargement of the cardiac silhouette. No pulmonary edema,  consolidation, or pleural effusion is evident. No pneumothorax is  seen.     Assessment & Plan:

## 2011-06-30 NOTE — Assessment & Plan Note (Signed)
She is clearly improving on bid ppi and given her obesity is high risk gerd so asked her to continue aggressive rx for now and make every effort to lose wt

## 2011-06-30 NOTE — Patient Instructions (Addendum)
Start back on advair 250 / 50 one click each am then rinse and gargle  You should see better breathing and less need for rescue  Work on inhaler technique:  relax and gently blow all the way out then take a nice smooth deep breath back in, triggering the inhaler at same time you start breathing in.  Hold for up to 5 seconds if you can.  Rinse and gargle with water when done   If your mouth or throat starts to bother you,   I suggest you time the inhaler to your dental care and after using the inhaler(s) brush teeth and tongue with a baking soda containing toothpaste and when you rinse this out, gargle with it first to see if this helps your mouth and throat.     See Tammy on return for pft's in place of appt with me to check your technique and progress and consider changing you to a nebulized form of advair/symbicort

## 2011-06-30 NOTE — Assessment & Plan Note (Signed)
-   PFT's 12/26/03 FEV1  1.27 (53%) ratio 66 and no better p B2 with DLC0 54 corrects to 126     - PFT's 06/30/2011 FEV1 1.18 (58%) ratio 66     -HFA 75% p coaching 06/30/2011   Not tolerating symbicort well but better on present rx with ppi bid so will rechallenge with just once daily adair to see if breathing satisfactory or whether upper airway effects of advair overshadow the benefit in which case we should try performist/budesonide

## 2011-07-03 ENCOUNTER — Encounter: Payer: Self-pay | Admitting: Adult Health

## 2011-07-03 ENCOUNTER — Ambulatory Visit (INDEPENDENT_AMBULATORY_CARE_PROVIDER_SITE_OTHER): Payer: Medicare Other | Admitting: Internal Medicine

## 2011-07-03 ENCOUNTER — Ambulatory Visit (INDEPENDENT_AMBULATORY_CARE_PROVIDER_SITE_OTHER): Payer: Medicare Other | Admitting: Adult Health

## 2011-07-03 VITALS — BP 136/74 | HR 61 | Temp 96.8°F | Ht 68.0 in | Wt 280.0 lb

## 2011-07-03 DIAGNOSIS — J449 Chronic obstructive pulmonary disease, unspecified: Secondary | ICD-10-CM

## 2011-07-03 LAB — PULMONARY FUNCTION TEST

## 2011-07-03 NOTE — Assessment & Plan Note (Signed)
Compensated on present regimen  Discussed inhaler technique and after care use to avoid UA irritation  Cont on GERD prevention

## 2011-07-03 NOTE — Progress Notes (Signed)
Subjective:    Patient ID: [name], female    DOB: 1932/08/18    MRN: [id number]  HPI 62 yobf minimal remote smoker with COPD/AB only mild airflow obstruction in 2005 pulm eval  referred back to pulmonary clinic 05/30/2011 by Sigmund Hazel for refractory cough.  05/30/2011 1st pulmnary eval in [place] happy with "asthma control" until early May 2-13. In April had been maintained on advair 500 and singulair but having two bad spells a year on avg of severe refractory cough with the  last one early may  2013 p commercial flight  to [place] already rx prednisone x 12 days  And z pak x 5 days prior to OV and only a little better assoc with lots of nasal congestion but minimal slt discolored nasal secrtions/ sputum and no overt hb. Sob with minimal exertion and also at hs if doesn't use extra saba, which helps despite very poor hfa technique  Denies any obvious fluctuation of symptoms with weather or environmental changes or other aggravating or alleviating factors except as outlined above. rec Stop advair Prilosec 20 mg Take 30- 60 min before your first and last meals of the day  Start symbicort 160 Take 2 puffs first thing in am and then another 2 puffs about 12 hours later.  Work on inhaler technique:   For cough mucinex dm 600mg  up to 2 every 12 hours Please schedule a follow up office visit in 4 weeks, sooner if needed with full pft's on return if possible, if not we'll do the bedside spirometry then   06/30/2011 f/u ov/Wert cc doe much better and off prednisone but having trouble using symbicort and using albuterol a few times daytime only But hfa poor at baseline, hoarseness much better, no cough or purulent secretions, overt hb or reflux symptoms or variability in terms of daytime doe. >   Cont on PPI Twice daily    07/03/2011 Follow up PFT  Returns for PFT today >FEV1 50% , ratio of 62, w/ no sign change after SABA . DLCO nml  Says she is doing well on Advair 500 Twice daily  .  Does  not want to change to neb at current time.  Cough resolved. No wheezing or increased SABA use.      ROS:  Constitutional:   No  weight loss, night sweats,  Fevers, chills,  +fatigue, or  lassitude.  HEENT:   No headaches,  Difficulty swallowing,  Tooth/dental problems, or  Sore throat,                No sneezing, itching, ear ache, nasal congestion, post nasal drip,   CV:  No chest pain,  Orthopnea, PND, swelling in lower extremities, anasarca, dizziness, palpitations, syncope.   GI  No heartburn, indigestion, abdominal pain, nausea, vomiting, diarrhea, change in bowel habits, loss of appetite, bloody stools.   Resp:  ,  No coughing up of blood.    No chest wall deformity  Skin: no rash or lesions.  GU: no dysuria, change in color of urine, no urgency or frequency.  No flank pain, no hematuria   MS:  No joint pain or swelling.  No decreased range of motion.  No back pain.  Psych:  No change in mood or affect. No depression or anxiety.  No memory loss.             Objective:   Physical Exam Obese min  hoarse amb bf nad Wt 06/30/2011  285 >280  07/03/2011     GEN: A/Ox3; pleasant , NAD,morbidly obese   HEENT:  [place]/AT,  EACs-clear, TMs-wnl, NOSE-clear, THROAT-clear, no lesions, no postnasal drip or exudate noted.   NECK:  Supple w/ fair ROM; no JVD; normal carotid impulses w/o bruits; no thyromegaly or nodules palpated; no lymphadenopathy.  RESP  Clear  P & A; w/o, wheezes/ rales/ or rhonchi.no accessory muscle use, no dullness to percussion  CARD:  RRR, no m/r/g  , no peripheral edema, pulses intact, no cyanosis or clubbing.  GI:   Soft & nt; nml bowel sounds; no organomegaly or masses detected.  Musco: Warm bil, no deformities or joint swelling noted.   Neuro: alert, no focal deficits noted.    Skin: Warm, no lesions or rashes      Assessment & Plan:

## 2011-07-03 NOTE — Patient Instructions (Addendum)
Continue on current regimen.  Brush/rinse/gargle after use.  follow up Dr. Sherene Sires  In 2 months and As needed

## 2011-07-03 NOTE — Progress Notes (Signed)
PFT done today. 

## 2011-07-04 ENCOUNTER — Encounter: Payer: Self-pay | Admitting: Internal Medicine

## 2011-07-15 ENCOUNTER — Telehealth: Payer: Self-pay | Admitting: Oncology

## 2011-07-15 ENCOUNTER — Other Ambulatory Visit (HOSPITAL_BASED_OUTPATIENT_CLINIC_OR_DEPARTMENT_OTHER): Payer: Medicare Other | Admitting: Lab

## 2011-07-15 ENCOUNTER — Ambulatory Visit (HOSPITAL_BASED_OUTPATIENT_CLINIC_OR_DEPARTMENT_OTHER): Payer: Medicare Other | Admitting: Oncology

## 2011-07-15 ENCOUNTER — Other Ambulatory Visit: Payer: Medicare Other

## 2011-07-15 VITALS — BP 131/54 | HR 53 | Temp 99.1°F | Ht 68.0 in | Wt 283.2 lb

## 2011-07-15 DIAGNOSIS — N289 Disorder of kidney and ureter, unspecified: Secondary | ICD-10-CM

## 2011-07-15 DIAGNOSIS — D649 Anemia, unspecified: Secondary | ICD-10-CM

## 2011-07-15 DIAGNOSIS — R0989 Other specified symptoms and signs involving the circulatory and respiratory systems: Secondary | ICD-10-CM

## 2011-07-15 DIAGNOSIS — D631 Anemia in chronic kidney disease: Secondary | ICD-10-CM

## 2011-07-15 DIAGNOSIS — R5383 Other fatigue: Secondary | ICD-10-CM

## 2011-07-15 LAB — CBC WITH DIFFERENTIAL/PLATELET
BASO%: 0.3 % (ref 0.0–2.0)
EOS%: 6.2 % (ref 0.0–7.0)
HGB: 11.1 g/dL — ABNORMAL LOW (ref 11.6–15.9)
MCH: 27.2 pg (ref 25.1–34.0)
MCHC: 32.1 g/dL (ref 31.5–36.0)
MCV: 84.8 fL (ref 79.5–101.0)
MONO%: 8.2 % (ref 0.0–14.0)
RBC: 4.08 10*6/uL (ref 3.70–5.45)
RDW: 15.6 % — ABNORMAL HIGH (ref 11.2–14.5)
lymph#: 2.1 10*3/uL (ref 0.9–3.3)
nRBC: 0 % (ref 0–0)

## 2011-07-15 MED ORDER — EPOETIN ALFA 40000 UNIT/ML IJ SOLN: 60000.0000 [IU] | INTRAMUSCULAR | Status: DC

## 2011-07-15 NOTE — Progress Notes (Signed)
   [facility]    OFFICE PROGRESS NOTE   INTERVAL HISTORY:   She returns as scheduled. She has been maintained on monthly Procrit. Ms. Buch continues to have exertional dyspnea. She is being evaluated by the pulmonary service. She reports the renal function has been stable.  Objective:  Vital signs in last 24 hours:  Blood pressure 131/54, pulse 53, temperature 99.1 F (37.3 C), temperature source Oral, height 5\' 8"  (1.727 m), weight 283 lb 3.2 oz (128.459 kg).    HEENT: Neck without mass Lymphatics: No cervical, supraclavicular, or right axillary nodes,? Soft 1/2-1 cm mobile left axillary node Resp: Scattered inspiratory/expiratory wheezes and rhonchi, no respiratory distress Cardio: Regular rate and rhythm GI: No hepatosplenomegaly Vascular: Chronic stasis change at the low leg bilaterally, no edema   Lab Results:  Lab Results  Component Value Date   WBC 6.6 07/15/2011   HGB 11.1* 07/15/2011   HCT 34.6* 07/15/2011   MCV 84.8 07/15/2011   PLT 172 07/15/2011   ANC 3.6    Medications: I have reviewed the patient's current medications.  Assessment/Plan: 1. Chronic anemia secondary to renal insufficiency, stable. The goal is to maintain the hemoglobin between 10 and 11.  2. Renal insufficiency, followed by Dr. Eliott Nine. 3. Hypothyroidism. 4. Chronic fatigue. 5. History of a duodenal polyp, status post a biopsy 06/12/2005 with the pathology confirming chronic duodenitis. 6. Diabetes. 7. Hypertension. 8. Asthma. Now followed by Dr. Sherene Sires 9. Congestive heart failure.    Disposition:  The hemoglobin is stable. The plan is to continue every 4 week Procrit therapy. We held a Procrit today for Medicare guidelines. She will return for a CBC and Procrit as indicated in 4 weeks. Ms. Hulce is scheduled for a 6 month office visit.   Thornton Papas, MD  07/15/2011  12:34 PM

## 2011-07-15 NOTE — Telephone Encounter (Signed)
appts made and printed for pt aom °

## 2011-07-23 ENCOUNTER — Encounter: Payer: Self-pay | Admitting: *Deleted

## 2011-07-24 ENCOUNTER — Encounter: Payer: Self-pay | Admitting: *Deleted

## 2011-08-12 ENCOUNTER — Ambulatory Visit (HOSPITAL_BASED_OUTPATIENT_CLINIC_OR_DEPARTMENT_OTHER): Payer: Medicare Other

## 2011-08-12 ENCOUNTER — Other Ambulatory Visit (HOSPITAL_BASED_OUTPATIENT_CLINIC_OR_DEPARTMENT_OTHER): Payer: Medicare Other | Admitting: Lab

## 2011-08-12 VITALS — BP 153/76 | HR 72 | Temp 98.1°F | Resp 20

## 2011-08-12 DIAGNOSIS — N189 Chronic kidney disease, unspecified: Secondary | ICD-10-CM

## 2011-08-12 DIAGNOSIS — N039 Chronic nephritic syndrome with unspecified morphologic changes: Secondary | ICD-10-CM

## 2011-08-12 DIAGNOSIS — D649 Anemia, unspecified: Secondary | ICD-10-CM

## 2011-08-12 DIAGNOSIS — D631 Anemia in chronic kidney disease: Secondary | ICD-10-CM

## 2011-08-12 LAB — FERRITIN: Ferritin: 482 ng/mL — ABNORMAL HIGH (ref 10–291)

## 2011-08-12 LAB — CBC WITH DIFFERENTIAL/PLATELET
BASO%: 1 % (ref 0.0–2.0)
EOS%: 5.2 % (ref 0.0–7.0)
HCT: 31.9 % — ABNORMAL LOW (ref 34.8–46.6)
HGB: 10.4 g/dL — ABNORMAL LOW (ref 11.6–15.9)
LYMPH%: 30.6 % (ref 14.0–49.7)
MCHC: 32.6 g/dL (ref 31.5–36.0)
MONO%: 6.8 % (ref 0.0–14.0)
NEUT#: 3.6 10*3/uL (ref 1.5–6.5)
RBC: 3.75 10*6/uL (ref 3.70–5.45)
WBC: 6.3 10*3/uL (ref 3.9–10.3)

## 2011-08-12 LAB — IRON AND TIBC: %SAT: 22 % (ref 20–55)

## 2011-08-12 MED ORDER — EPOETIN ALFA 40000 UNIT/ML IJ SOLN
60000.0000 [IU] | INTRAMUSCULAR | Status: DC
  Administered 2011-08-12: 60000 [IU] via SUBCUTANEOUS
  Filled 2011-08-12: qty 2

## 2011-09-04 ENCOUNTER — Encounter: Payer: Self-pay | Admitting: Internal Medicine

## 2011-09-04 ENCOUNTER — Ambulatory Visit (INDEPENDENT_AMBULATORY_CARE_PROVIDER_SITE_OTHER): Payer: Medicare Other | Admitting: Internal Medicine

## 2011-09-04 VITALS — BP 124/86 | HR 57 | Temp 98.6°F | Ht 68.0 in | Wt 284.0 lb

## 2011-09-04 DIAGNOSIS — J449 Chronic obstructive pulmonary disease, unspecified: Secondary | ICD-10-CM

## 2011-09-04 MED ORDER — TIOTROPIUM BROMIDE MONOHYDRATE 18 MCG IN CAPS: 18.0000 ug | ORAL_CAPSULE | Freq: Every day | RESPIRATORY_TRACT | Status: DC

## 2011-09-04 NOTE — Progress Notes (Signed)
Subjective:    Patient ID: [name], female    DOB: 09-04-1932    MRN: [id number]  HPI 62 yobf minimal remote smoker with COPD/AB only mild airflow obstruction in 2005 pulm eval  referred back to pulmonary clinic 05/30/2011 by [name] for refractory cough.  05/30/2011 1st pulmnary eval in emr era happy with "asthma control" until early May 2-13. In April had been maintained on advair 500 and singulair but having two bad spells a year on avg of severe refractory cough with the  last one early may  2013 p commercial flight  to [place] already rx prednisone x 12 days  And z pak x 5 days prior to OV and only a little better assoc with lots of nasal congestion but minimal slt discolored nasal secrtions/ sputum and no overt hb. Sob with minimal exertion and also at hs if doesn't use extra saba, which helps despite very poor hfa technique rec Stop advair Prilosec 20 mg Take 30- 60 min before your first and last meals of the day  Start symbicort 160 Take 2 puffs first thing in am and then another 2 puffs about 12 hours later.  Work on inhaler technique:   For cough mucinex dm 600mg  up to 2 every 12 hours Please schedule a follow up office visit in 4 weeks, sooner if needed with full pft's on return if possible, if not we'll do the bedside spirometry then   06/30/2011 f/u ov/[name] cc doe much better and off prednisone but having trouble using symbicort and using albuterol a few times daytime only But hfa poor at baseline, hoarseness much better, no cough or purulent secretions, overt hb or reflux symptoms or variability in terms of daytime doe. >   Cont on PPI Twice daily    07/03/2011 Follow up PFT  Returns for PFT  >FEV1 50% , ratio of 62, w/ no sign change after SABA . DLCO nml  Says she is doing well on Advair 250 Twice daily  .  Does not want to change to neb at current time.  Cough resolved. No wheezing or increased SABA use.  rec Continue on current regimen.  Brush/rinse/gargle after  use.   09/04/2011 f/u ov/[name] cc sob but can't name a single activity where breathing limits her but when asked what she would do in future if we could get her breathing better "walk at the mall".  Presently extremely sedentary.   No obvious daytime variabilty or assoc chronic cough or cp or chest tightness, subjective wheeze overt sinus or hb symptoms. No unusual exp hx   Sleeping ok without nocturnal  or early am exacerbation  of respiratory  c/o's or need for noct saba. Also denies any obvious fluctuation of symptoms with weather or environmental changes or other aggravating or alleviating factors except as outlined above   ROS  The following are not active complaints unless bolded sore throat, dysphagia, dental problems, itching, sneezing,  nasal congestion or excess/ purulent secretions, ear ache,   fever, chills, sweats, unintended wt loss, pleuritic or exertional cp, hemoptysis,  orthopnea pnd or leg swelling, presyncope, palpitations, heartburn, abdominal pain, anorexia, nausea, vomiting, diarrhea  or change in bowel or urinary habits, change in stools or urine, dysuria,hematuria,  rash, arthralgias, visual complaints, headache, numbness weakness or ataxia or problems with walking or coordination,  change in mood/affect or memory.  Objective:   Physical Exam Obese min  hoarse amb bf nad Wt 06/30/2011  285 >280 07/03/2011 > 09/04/2011  284     GEN: A/Ox3; pleasant , NAD,morbidly obese   HEENT:  [place]/AT,  EACs-clear, TMs-wnl, NOSE-clear, THROAT-clear, no lesions, no postnasal drip or exudate noted.   NECK:  Supple w/ fair ROM; no JVD; normal carotid impulses w/o bruits; no thyromegaly or nodules palpated; no lymphadenopathy.  RESP  Clear  P & A; w/o, wheezes/ rales/ or rhonchi.no accessory muscle use, no dullness to percussion  CARD:  RRR, no m/r/g  , no peripheral edema, pulses intact, no cyanosis or clubbing.  GI:   Soft & nt; nml bowel sounds; no  organomegaly or masses detected.  Musco: Warm bil, no deformities or joint swelling noted.   Neuro: alert, no focal deficits noted.    Skin: Warm, no lesions or rashes      Assessment & Plan:

## 2011-09-04 NOTE — Assessment & Plan Note (Signed)
-   PFT's 12/26/03 FEV1  1.27 (53%) ratio 66 and no better p B2 with DLC0 54 corrects to 126     - PFT's 06/30/2011 FEV1 1.18 (58%) ratio 66     -HFA 75% p coaching 06/30/2011  -07/03/2011 FEV1 1.11 L/ 50% , ratio 62, no sign change with SABA     - Spriva added 09/04/2011     GOLD II COPD s sign ab apparent so good candidate for spriva  I had an extended discussion with the patient today lasting 15 to 20 minutes of a 25 minute visit on the following issues:  The way to tell if spiriva is working is to try to push more with activities but she became very pessimistic / negative about her overall care to the point of demonstrating hopelessness and teared up when conceded "there may be too much wrong with me for your to fix"  If not improving activity p one month of spiriva should stop it and pulmonary f/u can be prn as I'm not really sure she is limited so much at this point by airflow (obesity/ deconditioning probably larger components)  The proper method of use, as well as anticipated side effects, of this dry powder inhaler are discussed and demonstrated to the patient.

## 2011-09-04 NOTE — Patient Instructions (Addendum)
Spiriva one capsule each am for a month   If you feel it helps your wind when you're active then fill the prescription   If you are satisfied with your treatment plan let your doctor know and he/she can either refill your medications or you can return here when your prescription runs out.     If in any way you are not 100% satisfied,  please tell us.  If 100% better, tell your friends!

## 2011-09-09 ENCOUNTER — Other Ambulatory Visit (HOSPITAL_BASED_OUTPATIENT_CLINIC_OR_DEPARTMENT_OTHER): Payer: Medicare Other | Admitting: Lab

## 2011-09-09 ENCOUNTER — Ambulatory Visit (HOSPITAL_BASED_OUTPATIENT_CLINIC_OR_DEPARTMENT_OTHER): Payer: Medicare Other

## 2011-09-09 VITALS — BP 124/56 | HR 49 | Temp 97.8°F

## 2011-09-09 DIAGNOSIS — D631 Anemia in chronic kidney disease: Secondary | ICD-10-CM

## 2011-09-09 DIAGNOSIS — N189 Chronic kidney disease, unspecified: Secondary | ICD-10-CM

## 2011-09-09 DIAGNOSIS — N039 Chronic nephritic syndrome with unspecified morphologic changes: Secondary | ICD-10-CM

## 2011-09-09 LAB — CBC WITH DIFFERENTIAL/PLATELET
Basophils Absolute: 0 10*3/uL (ref 0.0–0.1)
EOS%: 5.3 % (ref 0.0–7.0)
HCT: 30.9 % — ABNORMAL LOW (ref 34.8–46.6)
HGB: 9.9 g/dL — ABNORMAL LOW (ref 11.6–15.9)
MCH: 27.1 pg (ref 25.1–34.0)
MCV: 84.7 fL (ref 79.5–101.0)
NEUT%: 55.9 % (ref 38.4–76.8)
Platelets: 181 10*3/uL (ref 145–400)
lymph#: 2.1 10*3/uL (ref 0.9–3.3)

## 2011-09-09 MED ORDER — EPOETIN ALFA 40000 UNIT/ML IJ SOLN
60000.0000 [IU] | INTRAMUSCULAR | Status: DC
  Administered 2011-09-09: 60000 [IU] via SUBCUTANEOUS
  Filled 2011-09-09: qty 2

## 2011-09-20 ENCOUNTER — Other Ambulatory Visit: Payer: Self-pay | Admitting: Oncology

## 2011-09-20 DIAGNOSIS — D509 Iron deficiency anemia, unspecified: Secondary | ICD-10-CM

## 2011-09-20 DIAGNOSIS — K5909 Other constipation: Secondary | ICD-10-CM

## 2011-10-07 ENCOUNTER — Ambulatory Visit: Payer: Medicare Other

## 2011-10-07 ENCOUNTER — Other Ambulatory Visit (HOSPITAL_BASED_OUTPATIENT_CLINIC_OR_DEPARTMENT_OTHER): Payer: Medicare Other | Admitting: Lab

## 2011-10-07 DIAGNOSIS — N189 Chronic kidney disease, unspecified: Secondary | ICD-10-CM

## 2011-10-07 DIAGNOSIS — N039 Chronic nephritic syndrome with unspecified morphologic changes: Secondary | ICD-10-CM

## 2011-10-07 DIAGNOSIS — D631 Anemia in chronic kidney disease: Secondary | ICD-10-CM

## 2011-10-07 LAB — CBC WITH DIFFERENTIAL/PLATELET
BASO%: 0.4 % (ref 0.0–2.0)
EOS%: 5.5 % (ref 0.0–7.0)
MCH: 27.4 pg (ref 25.1–34.0)
MCHC: 31.7 g/dL (ref 31.5–36.0)
RBC: 4.05 10*6/uL (ref 3.70–5.45)
RDW: 15.3 % — ABNORMAL HIGH (ref 11.2–14.5)
lymph#: 2.4 10*3/uL (ref 0.9–3.3)

## 2011-10-07 MED ORDER — EPOETIN ALFA 40000 UNIT/ML IJ SOLN: 60000.0000 [IU] | INTRAMUSCULAR | Status: DC

## 2011-11-04 ENCOUNTER — Other Ambulatory Visit (HOSPITAL_BASED_OUTPATIENT_CLINIC_OR_DEPARTMENT_OTHER): Payer: Medicare Other | Admitting: Lab

## 2011-11-04 ENCOUNTER — Ambulatory Visit (HOSPITAL_BASED_OUTPATIENT_CLINIC_OR_DEPARTMENT_OTHER): Payer: Medicare Other

## 2011-11-04 VITALS — BP 109/56 | HR 61 | Temp 97.9°F

## 2011-11-04 DIAGNOSIS — D631 Anemia in chronic kidney disease: Secondary | ICD-10-CM

## 2011-11-04 DIAGNOSIS — N039 Chronic nephritic syndrome with unspecified morphologic changes: Secondary | ICD-10-CM

## 2011-11-04 DIAGNOSIS — N189 Chronic kidney disease, unspecified: Secondary | ICD-10-CM

## 2011-11-04 LAB — CBC WITH DIFFERENTIAL/PLATELET
BASO%: 0.5 % (ref 0.0–2.0)
Eosinophils Absolute: 0.4 10*3/uL (ref 0.0–0.5)
MCHC: 31.9 g/dL (ref 31.5–36.0)
MCV: 85.6 fL (ref 79.5–101.0)
MONO#: 0.5 10*3/uL (ref 0.1–0.9)
MONO%: 6.7 % (ref 0.0–14.0)
NEUT#: 4.1 10*3/uL (ref 1.5–6.5)
RBC: 3.81 10*6/uL (ref 3.70–5.45)
RDW: 15.6 % — ABNORMAL HIGH (ref 11.2–14.5)
WBC: 7.5 10*3/uL (ref 3.9–10.3)
nRBC: 0 % (ref 0–0)

## 2011-11-04 MED ORDER — EPOETIN ALFA 40000 UNIT/ML IJ SOLN
60000.0000 [IU] | INTRAMUSCULAR | Status: DC
  Administered 2011-11-04: 60000 [IU] via SUBCUTANEOUS
  Filled 2011-11-04: qty 2

## 2011-12-01 ENCOUNTER — Telehealth: Payer: Self-pay | Admitting: Oncology

## 2011-12-01 NOTE — Telephone Encounter (Signed)
Pt called to r/s lb/inj to next tuesdasy due to she is leaving town. Pt informed that I could move the appt to next Tuesday but I would have to move there remaining appts accordingly. Pt does not want to moved the remainging inj's and cannot come in today. Per pt she will just skip this inj and pick up with the next one. Pt made aware I would inform BS she intends to skip this inj (11/26). Message re above to desk nurse.

## 2011-12-02 ENCOUNTER — Ambulatory Visit: Payer: Medicare Other

## 2011-12-02 ENCOUNTER — Other Ambulatory Visit: Payer: Medicare Other | Admitting: Lab

## 2011-12-30 ENCOUNTER — Ambulatory Visit (HOSPITAL_BASED_OUTPATIENT_CLINIC_OR_DEPARTMENT_OTHER): Payer: Medicare Other

## 2011-12-30 ENCOUNTER — Other Ambulatory Visit (HOSPITAL_BASED_OUTPATIENT_CLINIC_OR_DEPARTMENT_OTHER): Payer: Medicare Other | Admitting: Lab

## 2011-12-30 VITALS — BP 126/58 | HR 53 | Temp 98.1°F

## 2011-12-30 DIAGNOSIS — D631 Anemia in chronic kidney disease: Secondary | ICD-10-CM

## 2011-12-30 DIAGNOSIS — N189 Chronic kidney disease, unspecified: Secondary | ICD-10-CM

## 2011-12-30 DIAGNOSIS — N039 Chronic nephritic syndrome with unspecified morphologic changes: Secondary | ICD-10-CM

## 2011-12-30 LAB — CBC WITH DIFFERENTIAL/PLATELET
BASO%: 0.3 % (ref 0.0–2.0)
EOS%: 4.7 % (ref 0.0–7.0)
HCT: 33 % — ABNORMAL LOW (ref 34.8–46.6)
MCHC: 32.1 g/dL (ref 31.5–36.0)
MONO#: 0.4 10*3/uL (ref 0.1–0.9)
NEUT%: 58.7 % (ref 38.4–76.8)
RDW: 15.6 % — ABNORMAL HIGH (ref 11.2–14.5)
WBC: 6.7 10*3/uL (ref 3.9–10.3)
lymph#: 2.1 10*3/uL (ref 0.9–3.3)
nRBC: 0 % (ref 0–0)

## 2011-12-30 MED ORDER — EPOETIN ALFA 40000 UNIT/ML IJ SOLN
60000.0000 [IU] | INTRAMUSCULAR | Status: DC
  Administered 2011-12-30: 60000 [IU] via SUBCUTANEOUS
  Filled 2011-12-30: qty 2

## 2012-01-27 ENCOUNTER — Other Ambulatory Visit (HOSPITAL_BASED_OUTPATIENT_CLINIC_OR_DEPARTMENT_OTHER): Payer: Medicare PPO | Admitting: Lab

## 2012-01-27 ENCOUNTER — Other Ambulatory Visit: Payer: Medicare Other | Admitting: Lab

## 2012-01-27 ENCOUNTER — Ambulatory Visit (HOSPITAL_BASED_OUTPATIENT_CLINIC_OR_DEPARTMENT_OTHER): Payer: Medicare PPO

## 2012-01-27 ENCOUNTER — Ambulatory Visit (HOSPITAL_BASED_OUTPATIENT_CLINIC_OR_DEPARTMENT_OTHER): Payer: Medicare PPO | Admitting: Oncology

## 2012-01-27 ENCOUNTER — Telehealth: Payer: Self-pay | Admitting: Oncology

## 2012-01-27 DIAGNOSIS — E119 Type 2 diabetes mellitus without complications: Secondary | ICD-10-CM

## 2012-01-27 DIAGNOSIS — N289 Disorder of kidney and ureter, unspecified: Secondary | ICD-10-CM

## 2012-01-27 DIAGNOSIS — I10 Essential (primary) hypertension: Secondary | ICD-10-CM

## 2012-01-27 DIAGNOSIS — N189 Chronic kidney disease, unspecified: Secondary | ICD-10-CM

## 2012-01-27 DIAGNOSIS — D649 Anemia, unspecified: Secondary | ICD-10-CM

## 2012-01-27 DIAGNOSIS — D631 Anemia in chronic kidney disease: Secondary | ICD-10-CM

## 2012-01-27 DIAGNOSIS — N039 Chronic nephritic syndrome with unspecified morphologic changes: Secondary | ICD-10-CM

## 2012-01-27 LAB — CBC WITH DIFFERENTIAL/PLATELET
Basophils Absolute: 0 10*3/uL (ref 0.0–0.1)
Eosinophils Absolute: 0.4 10*3/uL (ref 0.0–0.5)
HGB: 10.5 g/dL — ABNORMAL LOW (ref 11.6–15.9)
MCV: 86.9 fL (ref 79.5–101.0)
MONO#: 0.5 10*3/uL (ref 0.1–0.9)
NEUT#: 4.2 10*3/uL (ref 1.5–6.5)
RBC: 3.83 10*6/uL (ref 3.70–5.45)
RDW: 15.2 % — ABNORMAL HIGH (ref 11.2–14.5)
WBC: 7.3 10*3/uL (ref 3.9–10.3)
lymph#: 2.2 10*3/uL (ref 0.9–3.3)
nRBC: 0 % (ref 0–0)

## 2012-01-27 MED ORDER — EPOETIN ALFA 40000 UNIT/ML IJ SOLN
40000.0000 [IU] | INTRAMUSCULAR | Status: DC
  Administered 2012-01-27: 40000 [IU] via SUBCUTANEOUS
  Filled 2012-01-27: qty 1

## 2012-01-27 NOTE — Progress Notes (Signed)
    [facility]    OFFICE PROGRESS NOTE   INTERVAL HISTORY:   She returns as scheduled. She has continued monthly Procrit. Stable exertional dyspnea. No new complaint.  Objective:  Vital signs in last 24 hours:  Blood pressure 137/40, pulse 58, temperature 98.5 F (36.9 C), temperature source Oral, resp. rate 24, height 5\' 8"  (1.727 m), weight 286 lb 6.4 oz (129.91 kg).   Resp: end inspiratory rhonchi at the extreme posterior basis, no respiratory distress, inspiratory wheeze at the upper anterior chest Cardio: Regular rate and rhythm GI: No hepatosplenomegaly, nontender Vascular: Chronic stasis change at the low leg bilaterally   Lab Results:  Lab Results  Component Value Date   WBC 7.3 01/27/2012   HGB 10.5* 01/27/2012   HCT 33.3* 01/27/2012   MCV 86.9 01/27/2012   PLT 188 01/27/2012   ANC 4.2   Medications: I have reviewed the patient's current medications.  Assessment/Plan: 1. Chronic anemia secondary to renal insufficiency, stable. The goal is to maintain the hemoglobin between 10 and 11.  2. Renal insufficiency, followed by Dr. Eliott Nine. 3. Hypothyroidism. 4. Chronic fatigue. 5. History of a duodenal polyp, status post a biopsy 06/12/2005 with the pathology confirming chronic duodenitis. 6. Diabetes. 7. Hypertension. 8. Asthma.  9. Congestive heart failure.   Disposition:  The hemoglobin is stable. Her insurance has changed and she says the insurance will not pay for the 60,000 unit Procrit dose. We will switch to 40,000 units every 3 weeks. [name] will return for an office visit in 3 months.   Thornton Papas, MD  01/27/2012  12:48 PM

## 2012-01-27 NOTE — Telephone Encounter (Signed)
gv and printd appt schedule for pt for Feb thru May 2014...the patient aware

## 2012-02-17 ENCOUNTER — Other Ambulatory Visit (HOSPITAL_BASED_OUTPATIENT_CLINIC_OR_DEPARTMENT_OTHER): Payer: Medicare PPO | Admitting: Lab

## 2012-02-17 ENCOUNTER — Ambulatory Visit (HOSPITAL_BASED_OUTPATIENT_CLINIC_OR_DEPARTMENT_OTHER): Payer: Medicare PPO

## 2012-02-17 VITALS — BP 121/47 | HR 57 | Temp 98.2°F

## 2012-02-17 DIAGNOSIS — D649 Anemia, unspecified: Secondary | ICD-10-CM

## 2012-02-17 DIAGNOSIS — N189 Chronic kidney disease, unspecified: Secondary | ICD-10-CM

## 2012-02-17 DIAGNOSIS — D631 Anemia in chronic kidney disease: Secondary | ICD-10-CM

## 2012-02-17 DIAGNOSIS — N039 Chronic nephritic syndrome with unspecified morphologic changes: Secondary | ICD-10-CM

## 2012-02-17 LAB — FERRITIN: Ferritin: 396 ng/mL — ABNORMAL HIGH (ref 10–291)

## 2012-02-17 LAB — CBC WITH DIFFERENTIAL/PLATELET
Basophils Absolute: 0 10*3/uL (ref 0.0–0.1)
EOS%: 5.2 % (ref 0.0–7.0)
HGB: 10.9 g/dL — ABNORMAL LOW (ref 11.6–15.9)
LYMPH%: 29.1 % (ref 14.0–49.7)
MCH: 27.5 pg (ref 25.1–34.0)
MCV: 87.6 fL (ref 79.5–101.0)
MONO%: 5 % (ref 0.0–14.0)
NEUT%: 60.4 % (ref 38.4–76.8)
RDW: 15 % — ABNORMAL HIGH (ref 11.2–14.5)

## 2012-02-17 LAB — IRON AND TIBC: Iron: 54 ug/dL (ref 42–145)

## 2012-02-17 MED ORDER — EPOETIN ALFA 40000 UNIT/ML IJ SOLN
40000.0000 [IU] | INTRAMUSCULAR | Status: DC
  Administered 2012-02-17: 40000 [IU] via SUBCUTANEOUS
  Filled 2012-02-17: qty 1

## 2012-03-09 ENCOUNTER — Telehealth: Payer: Self-pay | Admitting: Oncology

## 2012-03-09 ENCOUNTER — Ambulatory Visit: Payer: Medicare PPO

## 2012-03-09 ENCOUNTER — Other Ambulatory Visit: Payer: Medicare PPO | Admitting: Lab

## 2012-03-10 ENCOUNTER — Ambulatory Visit (HOSPITAL_BASED_OUTPATIENT_CLINIC_OR_DEPARTMENT_OTHER): Payer: Medicare PPO

## 2012-03-10 ENCOUNTER — Other Ambulatory Visit (HOSPITAL_BASED_OUTPATIENT_CLINIC_OR_DEPARTMENT_OTHER): Payer: Medicare PPO | Admitting: Lab

## 2012-03-10 VITALS — BP 139/48 | HR 51 | Temp 97.9°F

## 2012-03-10 DIAGNOSIS — D631 Anemia in chronic kidney disease: Secondary | ICD-10-CM

## 2012-03-10 DIAGNOSIS — N189 Chronic kidney disease, unspecified: Secondary | ICD-10-CM

## 2012-03-10 LAB — CBC WITH DIFFERENTIAL/PLATELET
Basophils Absolute: 0 10*3/uL (ref 0.0–0.1)
Eosinophils Absolute: 0.3 10*3/uL (ref 0.0–0.5)
HCT: 33.5 % — ABNORMAL LOW (ref 34.8–46.6)
HGB: 10.6 g/dL — ABNORMAL LOW (ref 11.6–15.9)
MONO#: 0.5 10*3/uL (ref 0.1–0.9)
NEUT#: 3.7 10*3/uL (ref 1.5–6.5)
NEUT%: 57 % (ref 38.4–76.8)
WBC: 6.4 10*3/uL (ref 3.9–10.3)
lymph#: 2 10*3/uL (ref 0.9–3.3)

## 2012-03-10 MED ORDER — EPOETIN ALFA 40000 UNIT/ML IJ SOLN
40000.0000 [IU] | INTRAMUSCULAR | Status: DC
  Administered 2012-03-10: 40000 [IU] via SUBCUTANEOUS
  Filled 2012-03-10: qty 1

## 2012-03-29 ENCOUNTER — Other Ambulatory Visit: Payer: Self-pay | Admitting: *Deleted

## 2012-03-29 DIAGNOSIS — N259 Disorder resulting from impaired renal tubular function, unspecified: Secondary | ICD-10-CM

## 2012-03-30 ENCOUNTER — Other Ambulatory Visit (HOSPITAL_BASED_OUTPATIENT_CLINIC_OR_DEPARTMENT_OTHER): Payer: Medicare PPO | Admitting: Lab

## 2012-03-30 ENCOUNTER — Ambulatory Visit: Payer: Medicare PPO

## 2012-03-30 DIAGNOSIS — N259 Disorder resulting from impaired renal tubular function, unspecified: Secondary | ICD-10-CM

## 2012-03-30 DIAGNOSIS — D631 Anemia in chronic kidney disease: Secondary | ICD-10-CM

## 2012-03-30 LAB — CBC WITH DIFFERENTIAL/PLATELET
Basophils Absolute: 0 10*3/uL (ref 0.0–0.1)
Eosinophils Absolute: 0.2 10*3/uL (ref 0.0–0.5)
HGB: 11 g/dL — ABNORMAL LOW (ref 11.6–15.9)
LYMPH%: 31.4 % (ref 14.0–49.7)
MCV: 86.6 fL (ref 79.5–101.0)
MONO#: 0.3 10*3/uL (ref 0.1–0.9)
MONO%: 5.5 % (ref 0.0–14.0)
NEUT#: 3.5 10*3/uL (ref 1.5–6.5)
Platelets: 210 10*3/uL (ref 145–400)
WBC: 6 10*3/uL (ref 3.9–10.3)

## 2012-03-30 MED ORDER — EPOETIN ALFA 40000 UNIT/ML IJ SOLN: 40000.0000 [IU] | INTRAMUSCULAR | Status: DC

## 2012-04-06 ENCOUNTER — Telehealth: Payer: Self-pay | Admitting: Oncology

## 2012-04-20 ENCOUNTER — Other Ambulatory Visit (HOSPITAL_BASED_OUTPATIENT_CLINIC_OR_DEPARTMENT_OTHER): Payer: Medicare PPO | Admitting: Lab

## 2012-04-20 ENCOUNTER — Ambulatory Visit (HOSPITAL_BASED_OUTPATIENT_CLINIC_OR_DEPARTMENT_OTHER): Payer: Medicare PPO

## 2012-04-20 ENCOUNTER — Other Ambulatory Visit: Payer: Self-pay | Admitting: *Deleted

## 2012-04-20 ENCOUNTER — Ambulatory Visit: Payer: Medicare PPO | Admitting: Oncology

## 2012-04-20 ENCOUNTER — Other Ambulatory Visit: Payer: Medicare PPO | Admitting: Lab

## 2012-04-20 VITALS — BP 127/42 | HR 57 | Temp 98.2°F

## 2012-04-20 DIAGNOSIS — N189 Chronic kidney disease, unspecified: Secondary | ICD-10-CM

## 2012-04-20 DIAGNOSIS — D631 Anemia in chronic kidney disease: Secondary | ICD-10-CM

## 2012-04-20 DIAGNOSIS — N039 Chronic nephritic syndrome with unspecified morphologic changes: Secondary | ICD-10-CM

## 2012-04-20 LAB — CBC WITH DIFFERENTIAL/PLATELET
BASO%: 0.3 % (ref 0.0–2.0)
Basophils Absolute: 0 10*3/uL (ref 0.0–0.1)
HCT: 33.9 % — ABNORMAL LOW (ref 34.8–46.6)
HGB: 10.7 g/dL — ABNORMAL LOW (ref 11.6–15.9)
LYMPH%: 30.2 % (ref 14.0–49.7)
MCH: 27.4 pg (ref 25.1–34.0)
MCHC: 31.6 g/dL (ref 31.5–36.0)
MONO#: 0.4 10*3/uL (ref 0.1–0.9)
NEUT%: 59.6 % (ref 38.4–76.8)
Platelets: 170 10*3/uL (ref 145–400)
WBC: 6.9 10*3/uL (ref 3.9–10.3)
lymph#: 2.1 10*3/uL (ref 0.9–3.3)

## 2012-04-20 MED ORDER — EPOETIN ALFA 40000 UNIT/ML IJ SOLN
40000.0000 [IU] | INTRAMUSCULAR | Status: DC
  Administered 2012-04-20: 40000 [IU] via SUBCUTANEOUS
  Filled 2012-04-20: qty 1

## 2012-04-20 NOTE — Patient Instructions (Addendum)
Epoetin Alfa injection What is this medicine? EPOETIN ALFA (e POE e tin AL fa) helps your body make more red blood cells. This medicine is used to treat anemia caused by chronic kidney failure, cancer chemotherapy, or HIV-therapy. It may also be used before surgery if you have anemia. This medicine may be used for other purposes; ask your health care provider or pharmacist if you have questions. What should I tell my health care provider before I take this medicine? They need to know if you have any of these conditions: -blood clotting disorders -cancer patient not on chemotherapy -cystic fibrosis -heart disease, such as angina or heart failure -hemoglobin level of 12 g/dL or greater -high blood pressure -low levels of folate, iron, or vitamin B12 -seizures -an unusual or allergic reaction to erythropoietin, albumin, benzyl alcohol, hamster proteins, other medicines, foods, dyes, or preservatives -pregnant or trying to get pregnant -breast-feeding How should I use this medicine? This medicine is for injection into a vein or under the skin. It is usually given by a health care professional in a hospital or clinic setting. If you get this medicine at home, you will be taught how to prepare and give this medicine. Use exactly as directed. Take your medicine at regular intervals. Do not take your medicine more often than directed. It is important that you put your used needles and syringes in a special sharps container. Do not put them in a trash can. If you do not have a sharps container, call your pharmacist or healthcare provider to get one. Talk to your pediatrician regarding the use of this medicine in children. While this drug may be prescribed for selected conditions, precautions do apply. Overdosage: If you think you have taken too much of this medicine contact a poison control center or emergency room at once. NOTE: This medicine is only for you. Do not share this medicine with  others. What if I miss a dose? If you miss a dose, take it as soon as you can. If it is almost time for your next dose, take only that dose. Do not take double or extra doses. What may interact with this medicine? Do not take this medicine with any of the following medications: -darbepoetin alfa This list may not describe all possible interactions. Give your health care provider a list of all the medicines, herbs, non-prescription drugs, or dietary supplements you use. Also tell them if you smoke, drink alcohol, or use illegal drugs. Some items may interact with your medicine. What should I watch for while using this medicine? Visit your prescriber or health care professional for regular checks on your progress and for the needed blood tests and blood pressure measurements. It is especially important for the doctor to make sure your hemoglobin level is in the desired range, to limit the risk of potential side effects and to give you the best benefit. Keep all appointments for any recommended tests. Check your blood pressure as directed. Ask your doctor what your blood pressure should be and when you should contact him or her. As your body makes more red blood cells, you may need to take iron, folic acid, or vitamin B supplements. Ask your doctor or health care provider which products are right for you. If you have kidney disease continue dietary restrictions, even though this medication can make you feel better. Talk with your doctor or health care professional about the foods you eat and the vitamins that you take. What side effects may I notice   from receiving this medicine? Side effects that you should report to your doctor or health care professional as soon as possible: -allergic reactions like skin rash, itching or hives, swelling of the face, lips, or tongue -breathing problems -changes in vision -chest pain -confusion, trouble speaking or understanding -feeling faint or lightheaded,  falls -high blood pressure -muscle aches or pains -pain, swelling, warmth in the leg -rapid weight gain -severe headaches -sudden numbness or weakness of the face, arm or leg -trouble walking, dizziness, loss of balance or coordination -seizures (convulsions) -swelling of the ankles, feet, hands -unusually weak or tired Side effects that usually do not require medical attention (report to your doctor or health care professional if they continue or are bothersome): -diarrhea -fever, chills (flu-like symptoms) -headaches -nausea, vomiting -redness, stinging, or swelling at site where injected This list may not describe all possible side effects. Call your doctor for medical advice about side effects. You may report side effects to FDA at 1-800-FDA-1088. Where should I keep my medicine? Keep out of the reach of children. Store in a refrigerator between 2 and 8 degrees C (36 and 46 degrees F). Do not freeze or shake. Throw away any unused portion if using a single-dose vial. Multi-dose vials can be kept in the refrigerator for up to 21 days after the initial dose. Throw away unused medicine. NOTE: This sheet is a summary. It may not cover all possible information. If you have questions about this medicine, talk to your doctor, pharmacist, or health care provider.  2013, [employer]. (12/07/2007 10:25:44 AM)  

## 2012-05-11 ENCOUNTER — Ambulatory Visit (HOSPITAL_BASED_OUTPATIENT_CLINIC_OR_DEPARTMENT_OTHER): Payer: Medicare PPO

## 2012-05-11 ENCOUNTER — Ambulatory Visit (HOSPITAL_BASED_OUTPATIENT_CLINIC_OR_DEPARTMENT_OTHER): Payer: Medicare PPO | Admitting: Oncology

## 2012-05-11 ENCOUNTER — Other Ambulatory Visit: Payer: Self-pay | Admitting: *Deleted

## 2012-05-11 ENCOUNTER — Other Ambulatory Visit (HOSPITAL_BASED_OUTPATIENT_CLINIC_OR_DEPARTMENT_OTHER): Payer: Medicare PPO | Admitting: Lab

## 2012-05-11 ENCOUNTER — Other Ambulatory Visit: Payer: Medicare PPO | Admitting: Lab

## 2012-05-11 ENCOUNTER — Ambulatory Visit: Payer: Medicare PPO

## 2012-05-11 VITALS — BP 147/56 | HR 62 | Temp 98.6°F | Resp 19 | Ht 68.0 in | Wt 273.2 lb

## 2012-05-11 DIAGNOSIS — N289 Disorder of kidney and ureter, unspecified: Secondary | ICD-10-CM

## 2012-05-11 DIAGNOSIS — R350 Frequency of micturition: Secondary | ICD-10-CM

## 2012-05-11 DIAGNOSIS — N189 Chronic kidney disease, unspecified: Secondary | ICD-10-CM

## 2012-05-11 DIAGNOSIS — D649 Anemia, unspecified: Secondary | ICD-10-CM

## 2012-05-11 DIAGNOSIS — E119 Type 2 diabetes mellitus without complications: Secondary | ICD-10-CM

## 2012-05-11 LAB — CBC WITH DIFFERENTIAL/PLATELET
Eosinophils Absolute: 0.3 10*3/uL (ref 0.0–0.5)
HCT: 34.1 % — ABNORMAL LOW (ref 34.8–46.6)
LYMPH%: 31.9 % (ref 14.0–49.7)
MONO#: 0.4 10*3/uL (ref 0.1–0.9)
NEUT#: 3.9 10*3/uL (ref 1.5–6.5)
NEUT%: 57.7 % (ref 38.4–76.8)
Platelets: 203 10*3/uL (ref 145–400)
WBC: 6.8 10*3/uL (ref 3.9–10.3)

## 2012-05-11 MED ORDER — EPOETIN ALFA 40000 UNIT/ML IJ SOLN
40000.0000 [IU] | INTRAMUSCULAR | Status: DC
  Administered 2012-05-11: 40000 [IU] via SUBCUTANEOUS
  Filled 2012-05-11: qty 1

## 2012-05-11 NOTE — Progress Notes (Signed)
   [facility]    OFFICE PROGRESS NOTE   INTERVAL HISTORY:   She returns as scheduled. She is now maintained on every three-week appropriate. No new complaint aside from urinary incontinence. She plans to see her primary physician to evaluate the incontinence. Stable malaise and exertional dyspnea.  Objective:  Vital signs in last 24 hours:  Blood pressure 147/56, pulse 62, temperature 98.6 F (37 C), temperature source Oral, resp. rate 19, height 5\' 8"  (1.727 m), weight 273 lb 3.2 oz (123.923 kg).   Resp: Orthopnea, good air movement bilaterally, and inspiratory wheeze at the right upper chest, no respiratory distress Cardio: Regular rate and rhythm, 2/6 systolic murmur GI: Nontender, no hepatosplenomegaly Vascular: Chronic stasis change at the low leg bilaterally   Lab Results:  Lab Results  Component Value Date   WBC 6.9 04/20/2012   HGB 10.7* 04/20/2012   HCT 33.9* 04/20/2012   MCV 86.7 04/20/2012   PLT 170 04/20/2012   CBC pending today 02/17/2012-percent transferrin saturation-24, ferritin 396  Medications: I have reviewed the patient's current medications.  Assessment/Plan: 1. Chronic anemia secondary to renal insufficiency, stable. The goal is to maintain the hemoglobin between 10 and 11.  2. Renal insufficiency, followed by Dr. Eliott Nine. 3. Hypothyroidism. 4. Chronic fatigue. 5. History of a duodenal polyp, status post a biopsy 06/12/2005 with the pathology confirming chronic duodenitis. 6. Diabetes. 7. Hypertension. 8. Asthma.  9. Congestive heart failure.   Disposition:  She appears stable. The hemoglobin has remained in the goal range on the every 3 week Procrit. The plan is to continue Procrit every 3 weeks. She will return for an office visit in 4 months.   Thornton Papas, MD  05/11/2012  10:21 AM

## 2012-05-12 ENCOUNTER — Telehealth: Payer: Self-pay | Admitting: Oncology

## 2012-05-12 NOTE — Telephone Encounter (Signed)
Talked to pt and gave her appt for labs and injections until September 2014, pt will get calendar on 06/01/12

## 2012-06-01 ENCOUNTER — Ambulatory Visit (HOSPITAL_BASED_OUTPATIENT_CLINIC_OR_DEPARTMENT_OTHER): Payer: Medicare PPO

## 2012-06-01 ENCOUNTER — Other Ambulatory Visit (HOSPITAL_BASED_OUTPATIENT_CLINIC_OR_DEPARTMENT_OTHER): Payer: Medicare PPO | Admitting: Lab

## 2012-06-01 VITALS — BP 117/44 | HR 64 | Temp 98.6°F

## 2012-06-01 DIAGNOSIS — D649 Anemia, unspecified: Secondary | ICD-10-CM

## 2012-06-01 DIAGNOSIS — D631 Anemia in chronic kidney disease: Secondary | ICD-10-CM

## 2012-06-01 LAB — CBC WITH DIFFERENTIAL/PLATELET
BASO%: 0.3 % (ref 0.0–2.0)
Basophils Absolute: 0 10*3/uL (ref 0.0–0.1)
HCT: 34.3 % — ABNORMAL LOW (ref 34.8–46.6)
LYMPH%: 31.4 % (ref 14.0–49.7)
MCH: 27.7 pg (ref 25.1–34.0)
MCHC: 31.8 g/dL (ref 31.5–36.0)
MONO#: 0.3 10*3/uL (ref 0.1–0.9)
NEUT%: 57.7 % (ref 38.4–76.8)
Platelets: 166 10*3/uL (ref 145–400)
WBC: 6.2 10*3/uL (ref 3.9–10.3)

## 2012-06-01 MED ORDER — EPOETIN ALFA 40000 UNIT/ML IJ SOLN
40000.0000 [IU] | INTRAMUSCULAR | Status: DC
  Administered 2012-06-01: 40000 [IU] via SUBCUTANEOUS
  Filled 2012-06-01: qty 1

## 2012-06-22 ENCOUNTER — Other Ambulatory Visit: Payer: Self-pay | Admitting: *Deleted

## 2012-06-22 ENCOUNTER — Ambulatory Visit: Payer: Medicare PPO

## 2012-06-22 ENCOUNTER — Other Ambulatory Visit: Payer: Medicare PPO | Admitting: Lab

## 2012-06-22 ENCOUNTER — Other Ambulatory Visit (HOSPITAL_BASED_OUTPATIENT_CLINIC_OR_DEPARTMENT_OTHER): Payer: Medicare PPO | Admitting: Lab

## 2012-06-22 DIAGNOSIS — D631 Anemia in chronic kidney disease: Secondary | ICD-10-CM

## 2012-06-22 DIAGNOSIS — D649 Anemia, unspecified: Secondary | ICD-10-CM

## 2012-06-22 LAB — CBC WITH DIFFERENTIAL/PLATELET
Basophils Absolute: 0 10*3/uL (ref 0.0–0.1)
Eosinophils Absolute: 0.3 10*3/uL (ref 0.0–0.5)
HGB: 11 g/dL — ABNORMAL LOW (ref 11.6–15.9)
NEUT#: 3.8 10*3/uL (ref 1.5–6.5)
RDW: 15.1 % — ABNORMAL HIGH (ref 11.2–14.5)
WBC: 6.7 10*3/uL (ref 3.9–10.3)
lymph#: 2.2 10*3/uL (ref 0.9–3.3)

## 2012-06-22 MED ORDER — EPOETIN ALFA 40000 UNIT/ML IJ SOLN: 40000.0000 [IU] | INTRAMUSCULAR | Status: DC

## 2012-07-13 ENCOUNTER — Other Ambulatory Visit (HOSPITAL_BASED_OUTPATIENT_CLINIC_OR_DEPARTMENT_OTHER): Payer: Medicare PPO | Admitting: Lab

## 2012-07-13 ENCOUNTER — Other Ambulatory Visit: Payer: Medicare PPO | Admitting: Lab

## 2012-07-13 ENCOUNTER — Ambulatory Visit (HOSPITAL_BASED_OUTPATIENT_CLINIC_OR_DEPARTMENT_OTHER): Payer: Medicare PPO

## 2012-07-13 VITALS — BP 126/47 | HR 61 | Temp 98.3°F

## 2012-07-13 DIAGNOSIS — N189 Chronic kidney disease, unspecified: Secondary | ICD-10-CM

## 2012-07-13 DIAGNOSIS — D631 Anemia in chronic kidney disease: Secondary | ICD-10-CM

## 2012-07-13 DIAGNOSIS — D649 Anemia, unspecified: Secondary | ICD-10-CM

## 2012-07-13 LAB — CBC WITH DIFFERENTIAL/PLATELET
Eosinophils Absolute: 0.4 10*3/uL (ref 0.0–0.5)
HCT: 34 % — ABNORMAL LOW (ref 34.8–46.6)
LYMPH%: 34.4 % (ref 14.0–49.7)
MONO#: 0.4 10*3/uL (ref 0.1–0.9)
NEUT#: 3.3 10*3/uL (ref 1.5–6.5)
NEUT%: 52.7 % (ref 38.4–76.8)
Platelets: 187 10*3/uL (ref 145–400)
RBC: 3.95 10*6/uL (ref 3.70–5.45)
WBC: 6.3 10*3/uL (ref 3.9–10.3)
nRBC: 0 % (ref 0–0)

## 2012-07-13 MED ORDER — EPOETIN ALFA 40000 UNIT/ML IJ SOLN
40000.0000 [IU] | INTRAMUSCULAR | Status: DC
  Administered 2012-07-13: 40000 [IU] via SUBCUTANEOUS
  Filled 2012-07-13: qty 1

## 2012-08-03 ENCOUNTER — Ambulatory Visit: Payer: Medicare PPO

## 2012-08-03 ENCOUNTER — Other Ambulatory Visit (HOSPITAL_BASED_OUTPATIENT_CLINIC_OR_DEPARTMENT_OTHER): Payer: Medicare PPO | Admitting: Lab

## 2012-08-03 ENCOUNTER — Other Ambulatory Visit: Payer: Medicare PPO | Admitting: Lab

## 2012-08-03 DIAGNOSIS — D649 Anemia, unspecified: Secondary | ICD-10-CM

## 2012-08-03 DIAGNOSIS — D631 Anemia in chronic kidney disease: Secondary | ICD-10-CM

## 2012-08-03 LAB — CBC WITH DIFFERENTIAL/PLATELET
Basophils Absolute: 0 10*3/uL (ref 0.0–0.1)
EOS%: 5.5 % (ref 0.0–7.0)
Eosinophils Absolute: 0.3 10*3/uL (ref 0.0–0.5)
HCT: 35.9 % (ref 34.8–46.6)
HGB: 11.3 g/dL — ABNORMAL LOW (ref 11.6–15.9)
MCH: 27.2 pg (ref 25.1–34.0)
NEUT#: 3.2 10*3/uL (ref 1.5–6.5)
NEUT%: 52.8 % (ref 38.4–76.8)
lymph#: 2.2 10*3/uL (ref 0.9–3.3)

## 2012-08-03 MED ORDER — EPOETIN ALFA 40000 UNIT/ML IJ SOLN: 40000.0000 [IU] | INTRAMUSCULAR | Status: DC

## 2012-08-09 ENCOUNTER — Other Ambulatory Visit: Payer: Self-pay | Admitting: *Deleted

## 2012-08-09 MED ORDER — FENOFIBRATE MICRONIZED 43 MG PO CAPS: 43.0000 mg | ORAL_CAPSULE | Freq: Every day | ORAL | Status: DC

## 2012-08-11 ENCOUNTER — Other Ambulatory Visit: Payer: Self-pay | Admitting: Family Medicine

## 2012-08-11 DIAGNOSIS — R42 Dizziness and giddiness: Secondary | ICD-10-CM

## 2012-08-12 ENCOUNTER — Ambulatory Visit
Admission: RE | Admit: 2012-08-12 | Discharge: 2012-08-12 | Disposition: A | Discharge status: Home / Self Care | Payer: Medicare PPO | Source: Ambulatory Visit | Attending: Family Medicine | Admitting: Family Medicine

## 2012-08-12 DIAGNOSIS — R42 Dizziness and giddiness: Secondary | ICD-10-CM

## 2012-08-17 ENCOUNTER — Other Ambulatory Visit: Payer: Self-pay | Admitting: Family Medicine

## 2012-08-17 DIAGNOSIS — E041 Nontoxic single thyroid nodule: Secondary | ICD-10-CM

## 2012-08-18 ENCOUNTER — Ambulatory Visit
Admission: RE | Admit: 2012-08-18 | Discharge: 2012-08-18 | Disposition: A | Discharge status: Home / Self Care | Payer: Medicare PPO | Source: Ambulatory Visit | Attending: Family Medicine | Admitting: Family Medicine

## 2012-08-18 DIAGNOSIS — E041 Nontoxic single thyroid nodule: Secondary | ICD-10-CM

## 2012-08-24 ENCOUNTER — Other Ambulatory Visit: Payer: Medicare PPO | Admitting: Lab

## 2012-08-24 ENCOUNTER — Ambulatory Visit (HOSPITAL_BASED_OUTPATIENT_CLINIC_OR_DEPARTMENT_OTHER): Payer: Medicare PPO

## 2012-08-24 ENCOUNTER — Other Ambulatory Visit: Payer: Self-pay | Admitting: *Deleted

## 2012-08-24 ENCOUNTER — Other Ambulatory Visit (HOSPITAL_BASED_OUTPATIENT_CLINIC_OR_DEPARTMENT_OTHER): Payer: Medicare PPO | Admitting: Lab

## 2012-08-24 VITALS — BP 110/47 | HR 57 | Temp 98.4°F

## 2012-08-24 DIAGNOSIS — D631 Anemia in chronic kidney disease: Secondary | ICD-10-CM

## 2012-08-24 DIAGNOSIS — D649 Anemia, unspecified: Secondary | ICD-10-CM

## 2012-08-24 DIAGNOSIS — N189 Chronic kidney disease, unspecified: Secondary | ICD-10-CM

## 2012-08-24 LAB — CBC WITH DIFFERENTIAL/PLATELET
BASO%: 0.3 % (ref 0.0–2.0)
Basophils Absolute: 0 10*3/uL (ref 0.0–0.1)
EOS%: 4.9 % (ref 0.0–7.0)
HCT: 33.7 % — ABNORMAL LOW (ref 34.8–46.6)
LYMPH%: 38.2 % (ref 14.0–49.7)
MCH: 27.4 pg (ref 25.1–34.0)
MCHC: 32 g/dL (ref 31.5–36.0)
MCV: 85.5 fL (ref 79.5–101.0)
NEUT%: 51.4 % (ref 38.4–76.8)
Platelets: 176 10*3/uL (ref 145–400)

## 2012-08-24 MED ORDER — EPOETIN ALFA 40000 UNIT/ML IJ SOLN
40000.0000 [IU] | INTRAMUSCULAR | Status: DC
  Administered 2012-08-24: 40000 [IU] via SUBCUTANEOUS
  Filled 2012-08-24: qty 1

## 2012-09-01 ENCOUNTER — Other Ambulatory Visit: Payer: Self-pay | Admitting: *Deleted

## 2012-09-01 MED ORDER — LOSARTAN POTASSIUM 100 MG PO TABS: 100.0000 mg | ORAL_TABLET | Freq: Every day | ORAL | Status: DC

## 2012-09-08 ENCOUNTER — Other Ambulatory Visit: Payer: Self-pay | Admitting: *Deleted

## 2012-09-08 DIAGNOSIS — D649 Anemia, unspecified: Secondary | ICD-10-CM

## 2012-09-08 MED ORDER — "INSULIN SYRINGE-NEEDLE U-100 31G X 5/16"" 1 ML MISC": 1.0000 | Freq: Three times a day (TID) | Status: DC

## 2012-09-09 ENCOUNTER — Other Ambulatory Visit: Payer: Self-pay | Admitting: *Deleted

## 2012-09-09 DIAGNOSIS — D649 Anemia, unspecified: Secondary | ICD-10-CM

## 2012-09-09 DIAGNOSIS — E785 Hyperlipidemia, unspecified: Secondary | ICD-10-CM

## 2012-09-09 DIAGNOSIS — E119 Type 2 diabetes mellitus without complications: Secondary | ICD-10-CM

## 2012-09-10 ENCOUNTER — Ambulatory Visit: Payer: Medicare PPO | Admitting: Nurse Practitioner

## 2012-09-13 ENCOUNTER — Other Ambulatory Visit (INDEPENDENT_AMBULATORY_CARE_PROVIDER_SITE_OTHER): Payer: Medicare PPO

## 2012-09-13 DIAGNOSIS — E785 Hyperlipidemia, unspecified: Secondary | ICD-10-CM

## 2012-09-13 DIAGNOSIS — D649 Anemia, unspecified: Secondary | ICD-10-CM

## 2012-09-13 DIAGNOSIS — E119 Type 2 diabetes mellitus without complications: Secondary | ICD-10-CM

## 2012-09-13 LAB — MICROALBUMIN / CREATININE URINE RATIO
Creatinine,U: 79.9 mg/dL
Microalb Creat Ratio: 7.3 mg/g (ref 0.0–30.0)

## 2012-09-13 LAB — URINALYSIS
Bilirubin Urine: NEGATIVE
Leukocytes, UA: NEGATIVE
Nitrite: NEGATIVE
Specific Gravity, Urine: 1.01 (ref 1.000–1.030)
Urobilinogen, UA: 0.2 (ref 0.0–1.0)
pH: 6 (ref 5.0–8.0)

## 2012-09-13 LAB — LIPID PANEL
LDL Cholesterol: 75 mg/dL (ref 0–99)
Total CHOL/HDL Ratio: 4

## 2012-09-13 LAB — CBC
Hemoglobin: 11 g/dL — ABNORMAL LOW (ref 12.0–15.0)
Platelets: 223 10*3/uL (ref 150.0–400.0)
RDW: 15.8 % — ABNORMAL HIGH (ref 11.5–14.6)
WBC: 9.3 10*3/uL (ref 4.5–10.5)

## 2012-09-14 ENCOUNTER — Ambulatory Visit: Payer: Medicare PPO | Admitting: Endocrinology

## 2012-09-14 ENCOUNTER — Other Ambulatory Visit: Payer: Self-pay | Admitting: Nurse Practitioner

## 2012-09-14 ENCOUNTER — Other Ambulatory Visit (HOSPITAL_BASED_OUTPATIENT_CLINIC_OR_DEPARTMENT_OTHER): Payer: Medicare PPO | Admitting: Lab

## 2012-09-14 ENCOUNTER — Telehealth: Payer: Self-pay | Admitting: Oncology

## 2012-09-14 ENCOUNTER — Ambulatory Visit (HOSPITAL_BASED_OUTPATIENT_CLINIC_OR_DEPARTMENT_OTHER): Payer: Medicare PPO | Admitting: Nurse Practitioner

## 2012-09-14 ENCOUNTER — Other Ambulatory Visit: Payer: Medicare PPO | Admitting: Lab

## 2012-09-14 ENCOUNTER — Ambulatory Visit: Payer: Medicare PPO

## 2012-09-14 VITALS — BP 150/61 | HR 50 | Temp 98.1°F | Resp 21 | Ht 68.0 in | Wt 277.4 lb

## 2012-09-14 DIAGNOSIS — E119 Type 2 diabetes mellitus without complications: Secondary | ICD-10-CM

## 2012-09-14 DIAGNOSIS — N289 Disorder of kidney and ureter, unspecified: Secondary | ICD-10-CM

## 2012-09-14 DIAGNOSIS — D638 Anemia in other chronic diseases classified elsewhere: Secondary | ICD-10-CM

## 2012-09-14 DIAGNOSIS — D649 Anemia, unspecified: Secondary | ICD-10-CM

## 2012-09-14 DIAGNOSIS — D631 Anemia in chronic kidney disease: Secondary | ICD-10-CM

## 2012-09-14 DIAGNOSIS — N259 Disorder resulting from impaired renal tubular function, unspecified: Secondary | ICD-10-CM

## 2012-09-14 DIAGNOSIS — R0609 Other forms of dyspnea: Secondary | ICD-10-CM

## 2012-09-14 DIAGNOSIS — R5381 Other malaise: Secondary | ICD-10-CM

## 2012-09-14 DIAGNOSIS — I10 Essential (primary) hypertension: Secondary | ICD-10-CM

## 2012-09-14 NOTE — Progress Notes (Signed)
OFFICE PROGRESS NOTE  Interval history:  Ms. Brummet returns as scheduled. She is maintained on every three-week erythropoeitin injections. She is currently on a prednisone taper for gout and antibiotics for a skin infection. She noted improvement in chronic dyspnea coinciding with initiation of prednisone. She overall is feeling well. Energy level is stable.   Objective: Blood pressure 150/61, pulse 50, temperature 98.1 F (36.7 C), temperature source Oral, resp. rate 21, height 5\' 8"  (1.727 m), weight 277 lb 6.4 oz (125.828 kg).  Lungs are clear. No wheezes. Regular cardiac rhythm. 2/6 systolic murmur. Abdomen soft and nontender. No organomegaly. No leg edema. Chronic stasis changes at the lower legs bilaterally  Lab Results: Lab Results  Component Value Date   WBC 9.3 09/13/2012   HGB 11.0* 09/13/2012   HCT 33.6* 09/13/2012   MCV 84.3 09/13/2012   PLT 223.0 09/13/2012    Chemistry:    Chemistry      Component Value Date/Time   NA 141 02/07/2010 0505   K 3.8 02/07/2010 0505   CL 100 02/07/2010 0505   CO2 30 02/07/2010 0505   BUN 44* 02/07/2010 0505   CREATININE 1.78* 02/07/2010 0505      Component Value Date/Time   CALCIUM 8.9 02/07/2010 0505   ALKPHOS 65 02/06/2010 0730   AST 18 02/06/2010 0730   ALT 14 02/06/2010 0730   BILITOT 0.4 02/06/2010 0730       Studies/Results: US Soft Tissue Head/neck  08/18/2012   *RADIOLOGY REPORT*  Clinical Data: Bilateral thyroid nodules.  THYROID ULTRASOUND  Technique: Ultrasound examination of the thyroid gland and adjacent soft tissues was performed.  Comparison:  None.  Findings:  Right thyroid lobe:  Measures 2.9 x 1.9 x 1.2 cm. Left thyroid lobe:  Measures 4.1 x 2.1 x 1.8 cm. Isthmus:  Measures 5 mm in thickness.  Focal nodules:  6 mm hypoechoic nodule is seen in upper pole of left thyroid lobe.  2 mm nodule is noted in mid pole of right thyroid lobe.  3 mm nodule is noted in mid pole of left thyroid lobe laterally.  4 mm nodule is noted medially in mid pole of left  thyroid lobe. 9 mm complex nodule is noted posteriorly in the inferior pole of the left thyroid lobe. 6 mm hypoechoic nodule is noted in the inferior pole of left thyroid lobe.  Lymphadenopathy:  Lymph node measuring 9 x 8 x 4 mm is noted in the right cervical region.  Fatty hilum is noted.  IMPRESSION: Small sub centimeter thyroid nodules and cysts are seen bilaterally.  No dominant masses noted.  Follow-up ultrasound in 12 months is recommended.   Original Report Authenticated By: Lupita Raider.,  M.D.    Medications: I have reviewed the patient's current medications.  Assessment/Plan:  1. Chronic anemia secondary to renal insufficiency, stable. The goal is to maintain the hemoglobin between 10 and 11.  2. Renal insufficiency, followed by Dr. Eliott Nine. 3. Hypothyroidism. 4. Chronic fatigue. 5. History of a duodenal polyp, status post a biopsy 06/12/2005 with the pathology confirming chronic duodenitis. 6. Diabetes. 7. Hypertension. 8. Asthma.  9. Congestive heart failure.  10. Recent gout flare and skin infection.  Disposition-Ms. Noffsinger appears stable. The hemoglobin is in goal range. Plan to continue Procrit every 3 weeks. She will return for a followup visit in 4 months.  Lonna Cobb ANP/GNP-BC

## 2012-09-14 NOTE — Telephone Encounter (Signed)
Gave pt appt for lab,md and injections until january 2015

## 2012-09-15 ENCOUNTER — Encounter: Payer: Self-pay | Admitting: Endocrinology

## 2012-09-15 ENCOUNTER — Ambulatory Visit (INDEPENDENT_AMBULATORY_CARE_PROVIDER_SITE_OTHER): Payer: Medicare PPO | Admitting: Endocrinology

## 2012-09-15 VITALS — BP 138/68 | HR 56 | Temp 98.8°F | Resp 12 | Wt 275.5 lb

## 2012-09-15 DIAGNOSIS — E039 Hypothyroidism, unspecified: Secondary | ICD-10-CM

## 2012-09-15 NOTE — Patient Instructions (Signed)
Please check blood sugars at least half the time about 2 hours after any meal and as directed on waking up. Please bring blood sugar monitor to each visit  Call if sugar stays high

## 2012-09-15 NOTE — Progress Notes (Signed)
Patient ID: [name], female   DOB: 1932-01-30, 79 y.o.   MRN: [id number]  [name] is an 79 y.o. female.   Reason for Appointment: Diabetes follow-up   History of Present Illness   Diagnosis: Type 2 DIABETES MELITUS, date of diagnosis:  1997  Past history: She was started on insulin within a year of diagnosis; had diarrhea from metformin and swelling from Actos Generally her blood sugars are fairly well-controlled with basal bolus insulin regimen and A1c mostly below 7  RECENT history: Her blood sugars and to be more erratic recently with periods of significant hyperglycemia, recently from taking prednisone . However has occasional readings over 300 even early morning which she cannot explain, does not think she has forgotten insulin doses Her A1c is not always consistent with her blood sugars because of anemia and renal insufficiency      Insulin regimen: Lantus 46 units a.m., 22 units PM and NovoLog  04-12-20 a.c.         Proper timing of medications in relation to meals: Yes.         Monitors blood glucose: Once a day.    Glucometer: One Touch.           Blood Glucose readings from meter download: readings before breakfast: Fasting range 101-317 with median about 155, higher with prednisone Midday 47, 244, afternoon and evening 84-308 with sporadic high readings and overall median 171 Hypoglycemia frequency: 47 yesterday with skipping her meals still 1 PM.           Meals:  2-3 meals per day.  will sometimes get more fruits like watermelon and occasionally sweet drinks         Physical activity:  unable to do much            Dietician visit: Most recent: 6/14          Complications: are: ? Nephropathy, last microalbumin/ creatinine ratio was 87 in 2/14  The last HbgA1c  report is not available  Wt Readings from Last 3 Encounters:  09/15/12 275 lb 8 oz (124.966 kg)  09/14/12 277 lb 6.4 oz (125.828 kg)  05/11/12 273 lb 3.2 oz (123.923 kg)    Appointment on 09/13/2012   Component Date Value Range Status  . Cholesterol 09/13/2012 145  0 - 200 mg/dL Final   ATP III Classification       Desirable:  < 200 mg/dL               Borderline High:  200 - 239 mg/dL          High:  > = 409 mg/dL  . Triglycerides 09/13/2012 145.0  0.0 - 149.0 mg/dL Final   Normal:  <811 mg/dLBorderline High:  150 - 199 mg/dL  . HDL 09/13/2012 41.20  >39.00 mg/dL Final  . VLDL 91/47/8295 29.0  0.0 - 40.0 mg/dL Final  . LDL Cholesterol 09/13/2012 75  0 - 99 mg/dL Final  . Total CHOL/HDL Ratio 09/13/2012 4   Final                  Men          Women1/2 Average Risk     3.4          3.3Average Risk          5.0          4.42X Average Risk          9.6  7.13X Average Risk          15.0          11.0                      . WBC 09/13/2012 9.3  4.5 - 10.5 K/uL Final  . RBC 09/13/2012 3.99  3.87 - 5.11 Mil/uL Final  . Platelets 09/13/2012 223.0  150.0 - 400.0 K/uL Final  . Hemoglobin 09/13/2012 11.0* 12.0 - 15.0 g/dL Final  . HCT 96/04/5407 33.6* 36.0 - 46.0 % Final  . MCV 09/13/2012 84.3  78.0 - 100.0 fl Final  . MCHC 09/13/2012 32.7  30.0 - 36.0 g/dL Final  . RDW 81/19/1478 15.8* 11.5 - 14.6 % Final  . Microalb, Ur 09/13/2012 5.8* 0.0 - 1.9 mg/dL Final  . Creatinine,U 29/56/2130 79.9   Final  . Microalb Creat Ratio 09/13/2012 7.3  0.0 - 30.0 mg/g Final  . Color, Urine 09/13/2012 LT. YELLOW  Yellow;Lt. Yellow Final  . APPearance 09/13/2012 CLEAR  Clear Final  . Specific Gravity, Urine 09/13/2012 1.010  1.000-1.030 Final  . pH 09/13/2012 6.0  5.0 - 8.0 Final  . Total Protein, Urine 09/13/2012 NEGATIVE  Negative Final  . Urine Glucose 09/13/2012 NEGATIVE  Negative Final  . Ketones, ur 09/13/2012 NEGATIVE  Negative Final  . Bilirubin Urine 09/13/2012 NEGATIVE  Negative Final  . Hgb urine dipstick 09/13/2012 NEGATIVE  Negative Final  . Urobilinogen, UA 09/13/2012 0.2  0.0 - 1.0 Final  . Leukocytes, UA 09/13/2012 NEGATIVE  Negative Final  . Nitrite 09/13/2012 NEGATIVE  Negative  Final  . Hemoglobin A1C 09/13/2012 6.3  4.6 - 6.5 % Final   Glycemic Control Guidelines for People with Diabetes:Non Diabetic:  <6%Goal of Therapy: <7%Additional Action Suggested:  >8%       Medication List       This list is accurate as of: 09/15/12  8:27 AM.  Always use your most recent med list.               ADVAIR DISKUS 250-50 MCG/DOSE Aepb  Generic drug:  Fluticasone-Salmeterol  Inhale 1 puff into the lungs every 12 (twelve) hours.     albuterol 108 (90 BASE) MCG/ACT inhaler  Commonly known as:  PROVENTIL HFA;VENTOLIN HFA  Inhale 2 puffs into the lungs every 6 (six) hours as needed.     allopurinol 100 MG tablet  Commonly known as:  ZYLOPRIM  Take 100 mg by mouth daily.     aspirin 81 MG tablet  Take 81 mg by mouth daily.     diltiazem 300 MG 24 hr capsule  Commonly known as:  CARDIZEM CD  Take 300 mg by mouth daily.     docusate sodium 100 MG capsule  Commonly known as:  COLACE  Take 100 mg by mouth 2 (two) times daily as needed.     ergocalciferol 50000 UNITS capsule  Commonly known as:  VITAMIN D2  Take 50,000 Units by mouth once a week.     fenofibrate micronized 43 MG capsule  Commonly known as:  ANTARA  Take 1 capsule (43 mg total) by mouth daily before breakfast.     ferrous sulfate 325 (65 FE) MG tablet  Take 325 mg by mouth 2 (two) times daily.     furosemide 80 MG tablet  Commonly known as:  LASIX  Take 80 mg by mouth daily.     insulin aspart 100 UNIT/ML injection  Commonly known as:  novoLOG  Inject 4 Units into the skin as directed. 4 units before breakfast, 7 units before lunch, 22 units before supper     insulin glargine 100 UNIT/ML injection  Commonly known as:  LANTUS  Inject 46 Units into the skin 2 (two) times daily. 46 units Qam, 22 units before supper     Insulin Syringe-Needle U-100 31G X 5/16" 1 ML Misc  Commonly known as:  B-D INS SYR ULTRAFINE 1CC/31G  Inject 1 each into the skin 3 (three) times daily.     levothyroxine  25 MCG tablet  Commonly known as:  SYNTHROID, LEVOTHROID  Take 25 mcg by mouth daily.     losartan 100 MG tablet  Commonly known as:  COZAAR  Take 1 tablet (100 mg total) by mouth daily.     montelukast 10 MG tablet  Commonly known as:  SINGULAIR  Take 10 mg by mouth at bedtime.     omeprazole 20 MG capsule  Commonly known as:  PRILOSEC  Take 30- 60 min before your first and last meals of the day     ONE TOUCH ULTRA TEST test strip  Generic drug:  glucose blood     polyethylene glycol packet  Commonly known as:  MIRALAX / GLYCOLAX  MIX TO TAKE 17GM AS NEEDED AS DIRECTED     pravastatin 20 MG tablet  Commonly known as:  PRAVACHOL  Take 20 mg by mouth daily.     predniSONE 10 MG tablet  Commonly known as:  STERAPRED UNI-PAK  Take 10 mg by mouth daily. Will complete on 09/16/12     PROCRIT IJ  Inject 40,000 Units as directed every 30 (thirty) days.     sulfamethoxazole-trimethoprim 800-160 MG per tablet  Commonly known as:  BACTRIM DS  Take 1 tablet by mouth daily. Not sure of tablet strength; takes once a day; will complete on 09/18/12        Allergies:  Allergies  Allergen Reactions  . Percocet [Oxycodone-Acetaminophen] Nausea And Vomiting  . Codeine   . Zantac [Ranitidine Hcl]     Past Medical History  Diagnosis Date  . Hypertension   . Heart failure   . Asthma   . Diabetes mellitus, type 2   . Sleep apnea     Past Surgical History  Procedure Laterality Date  . Cholecystectomy  1973  . Total abdominal hysterectomy  1982  . Tubal ligation  1973  . Breast reduction surgery  2002    Family History  Problem Relation Age of Onset  . Asthma Sister   . Asthma Mother   . Heart disease Father   . Cancer Sister     breast cancer  . Cancer Brother     kidney    Social History:  reports that she quit smoking about 48 years ago. Her smoking use included Cigarettes. She has a 2.7 pack-year smoking history. She does not have any smokeless tobacco history  on file. She reports that she does not drink alcohol or use illicit drugs.  Review of Systems:  HYPERTENSION:  long-standing and well controlled  No recent pedal edema  She has had chronic lung disease  HYPERLIPIDEMIA: The lipid abnormality consists mostly of high triglycerides, now well controlled  ? Gout hand: She was given prednisone by orthopedic surgeon for this and also antibiotics which she is still taking      Examination:   BP 138/68  Pulse 56  Temp(Src) 98.8 F (37.1 C) (Oral)  Resp 12  Wt  275 lb 8 oz (124.966 kg)  BMI 41.9 kg/m2  SpO2 97%  Body mass index is 41.9 kg/(m^2).   No pedal edema   ASSESSMENT/ PLAN::   Diabetes type 2   The patient's diabetes control appears to be inconsistent now and has periods of hyperglycemia Her A1c is excellent at 6.3 but this is probably not accurate because of her anemia and renal dysfunction For now will increase her insulin doses all around because of taking prednisone Also advised her to be consistent with her diet and watching foods like watermelon and high fat meals that make her glucose increase She has seen the dietitian in June and advised her to follow instructions given She will also need to check her blood sugars more consistently at various times including after meals and review home monitoring again on the next visit  Will not change her basic regimen since she does not have any consistent periods of hyperglycemia Given her guidelines on how to adjust her insulin when she has any illness drugs like prednisone but increase her blood sugar  HYPERTENSION: Well controlled, to continue same medications  Hyperlipidemia: She has had history of high triglycerides and low HDL, well controlled with fenofibrate and pravastatin  CKD: She will followup with nephrologist  Counseling time over 50% of today's 25 minute visit   Rush Salce 09/15/2012, 8:27 AM

## 2012-10-05 ENCOUNTER — Other Ambulatory Visit (HOSPITAL_BASED_OUTPATIENT_CLINIC_OR_DEPARTMENT_OTHER): Payer: Medicare PPO | Admitting: Lab

## 2012-10-05 ENCOUNTER — Ambulatory Visit (HOSPITAL_BASED_OUTPATIENT_CLINIC_OR_DEPARTMENT_OTHER): Payer: Medicare PPO

## 2012-10-05 VITALS — BP 129/37 | HR 53 | Temp 98.1°F

## 2012-10-05 DIAGNOSIS — D631 Anemia in chronic kidney disease: Secondary | ICD-10-CM

## 2012-10-05 DIAGNOSIS — N189 Chronic kidney disease, unspecified: Secondary | ICD-10-CM

## 2012-10-05 DIAGNOSIS — N259 Disorder resulting from impaired renal tubular function, unspecified: Secondary | ICD-10-CM

## 2012-10-05 DIAGNOSIS — D649 Anemia, unspecified: Secondary | ICD-10-CM

## 2012-10-05 LAB — CBC WITH DIFFERENTIAL/PLATELET
BASO%: 1 % (ref 0.0–2.0)
EOS%: 4.7 % (ref 0.0–7.0)
Eosinophils Absolute: 0.3 10*3/uL (ref 0.0–0.5)
LYMPH%: 30.3 % (ref 14.0–49.7)
MCHC: 32.7 g/dL (ref 31.5–36.0)
MCV: 84.7 fL (ref 79.5–101.0)
MONO%: 7.2 % (ref 0.0–14.0)
NEUT#: 3.8 10*3/uL (ref 1.5–6.5)
Platelets: 170 10*3/uL (ref 145–400)
RBC: 3.84 10*6/uL (ref 3.70–5.45)
RDW: 15.2 % — ABNORMAL HIGH (ref 11.2–14.5)

## 2012-10-05 MED ORDER — EPOETIN ALFA 40000 UNIT/ML IJ SOLN
40000.0000 [IU] | INTRAMUSCULAR | Status: DC
  Administered 2012-10-05: 40000 [IU] via SUBCUTANEOUS
  Filled 2012-10-05: qty 1

## 2012-10-12 ENCOUNTER — Telehealth: Payer: Self-pay | Admitting: Endocrinology

## 2012-10-12 MED ORDER — INSULIN ASPART 100 UNIT/ML ~~LOC~~ SOLN: SUBCUTANEOUS | Status: DC

## 2012-10-12 NOTE — Telephone Encounter (Signed)
rx sent with updated amount

## 2012-10-15 ENCOUNTER — Telehealth: Payer: Self-pay | Admitting: Endocrinology

## 2012-10-15 MED ORDER — ALLOPURINOL 100 MG PO TABS: 100.0000 mg | ORAL_TABLET | Freq: Every day | ORAL | Status: DC

## 2012-10-15 NOTE — Telephone Encounter (Signed)
rx sent

## 2012-10-25 ENCOUNTER — Ambulatory Visit (HOSPITAL_BASED_OUTPATIENT_CLINIC_OR_DEPARTMENT_OTHER): Payer: Medicare PPO

## 2012-10-25 ENCOUNTER — Telehealth: Payer: Self-pay | Admitting: Endocrinology

## 2012-10-25 ENCOUNTER — Other Ambulatory Visit (HOSPITAL_BASED_OUTPATIENT_CLINIC_OR_DEPARTMENT_OTHER): Payer: Medicare PPO | Admitting: Lab

## 2012-10-25 VITALS — BP 146/53 | HR 58 | Temp 98.3°F

## 2012-10-25 DIAGNOSIS — N189 Chronic kidney disease, unspecified: Secondary | ICD-10-CM

## 2012-10-25 DIAGNOSIS — D649 Anemia, unspecified: Secondary | ICD-10-CM

## 2012-10-25 DIAGNOSIS — D631 Anemia in chronic kidney disease: Secondary | ICD-10-CM

## 2012-10-25 DIAGNOSIS — N259 Disorder resulting from impaired renal tubular function, unspecified: Secondary | ICD-10-CM

## 2012-10-25 LAB — CBC WITH DIFFERENTIAL/PLATELET
BASO%: 0.3 % (ref 0.0–2.0)
Eosinophils Absolute: 0.4 10*3/uL (ref 0.0–0.5)
LYMPH%: 30.7 % (ref 14.0–49.7)
MCHC: 31.3 g/dL — ABNORMAL LOW (ref 31.5–36.0)
MONO#: 0.5 10*3/uL (ref 0.1–0.9)
NEUT#: 3.4 10*3/uL (ref 1.5–6.5)
Platelets: 196 10*3/uL (ref 145–400)
RBC: 3.85 10*6/uL (ref 3.70–5.45)
RDW: 15.4 % — ABNORMAL HIGH (ref 11.2–14.5)
WBC: 6.2 10*3/uL (ref 3.9–10.3)
lymph#: 1.9 10*3/uL (ref 0.9–3.3)
nRBC: 0 % (ref 0–0)

## 2012-10-25 MED ORDER — LEVOTHYROXINE SODIUM 25 MCG PO TABS: 25.0000 ug | ORAL_TABLET | Freq: Every day | ORAL | Status: DC

## 2012-10-25 MED ORDER — EPOETIN ALFA 40000 UNIT/ML IJ SOLN
40000.0000 [IU] | INTRAMUSCULAR | Status: DC
  Administered 2012-10-25: 40000 [IU] via SUBCUTANEOUS
  Filled 2012-10-25: qty 1

## 2012-10-25 NOTE — Telephone Encounter (Signed)
rx sent

## 2012-10-26 ENCOUNTER — Ambulatory Visit: Payer: Medicare PPO

## 2012-10-26 ENCOUNTER — Other Ambulatory Visit: Payer: Medicare PPO | Admitting: Lab

## 2012-10-27 ENCOUNTER — Other Ambulatory Visit (INDEPENDENT_AMBULATORY_CARE_PROVIDER_SITE_OTHER): Payer: Medicare PPO

## 2012-10-27 DIAGNOSIS — IMO0001 Reserved for inherently not codable concepts without codable children: Secondary | ICD-10-CM

## 2012-10-27 DIAGNOSIS — E039 Hypothyroidism, unspecified: Secondary | ICD-10-CM

## 2012-10-27 LAB — BASIC METABOLIC PANEL
CO2: 28 mEq/L (ref 19–32)
Chloride: 109 mEq/L (ref 96–112)
Creatinine, Ser: 1.6 mg/dL — ABNORMAL HIGH (ref 0.4–1.2)
Sodium: 145 mEq/L (ref 135–145)

## 2012-10-27 LAB — TSH: TSH: 3.51 u[IU]/mL (ref 0.35–5.50)

## 2012-10-27 LAB — FRUCTOSAMINE: Fructosamine: 230 umol/L (ref <285)

## 2012-11-01 ENCOUNTER — Ambulatory Visit (INDEPENDENT_AMBULATORY_CARE_PROVIDER_SITE_OTHER): Payer: Medicare PPO | Admitting: Endocrinology

## 2012-11-01 ENCOUNTER — Encounter: Payer: Self-pay | Admitting: Endocrinology

## 2012-11-01 VITALS — BP 128/70 | HR 60 | Temp 98.7°F | Resp 14 | Ht 68.0 in | Wt 281.1 lb

## 2012-11-01 DIAGNOSIS — IMO0001 Reserved for inherently not codable concepts without codable children: Secondary | ICD-10-CM

## 2012-11-01 NOTE — Progress Notes (Signed)
Patient ID: [name], female   DOB: 1932/08/18, 79 y.o.   MRN: [id number]  [name] is an 79 y.o. female.   Reason for Appointment: Diabetes follow-up   History of Present Illness   Diagnosis: Type 2 DIABETES MELITUS, date of diagnosis:  1997  Past history: She was started on insulin within a year of diagnosis; had diarrhea from metformin and swelling from Actos Generally her blood sugars are fairly well-controlled with basal bolus insulin regimen and A1c mostly below 7  RECENT history: Her blood sugars were fluctuating more and sometimes higher with taking prednisone on the last visit However in the last month she has had more hypoglycemia and overall blood sugars are lower She thinks this may be from her watching her portions. She tends to eat little protein at breakfast and yesterday because of eating very little in the morning she had a mild hypoglycemia in the afternoon Otherwise has had tendency to hypoglycemia before supper and bedtime Also bedtime readings are somewhat low normal  Her A1c is frequently not consistent with her blood sugars because of anemia and renal insufficiency      Insulin regimen: Lantus 46 units a.m., 22 units PM and NovoLog  04-12-20 a.c.         Proper timing of medications in relation to meals: Yes.         Monitors blood glucose: Once a day.    Glucometer: One Touch.           Blood Glucose readings from meter download: readings before breakfast: Fasting range 84-156, median about 110 Early afternoon 50-201, around 6-7 PM 51-231 and at bedtime 49-170 with median 81 Overall median 113 Hypoglycemia frequency:  minimal in the last week but previously 2-3 times a week           Meals:  2-3 meals per day. Supper 7 pm   Physical activity:  unable to do much            Dietician visit: Most recent: 6/14          Complications: are: ? Nephropathy, last microalbumin/ creatinine ratio was 87 in 2/14  The last HbgA1c  report is not available  Wt  Readings from Last 3 Encounters:  11/01/12 281 lb 1.6 oz (127.506 kg)  09/15/12 275 lb 8 oz (124.966 kg)  09/14/12 277 lb 6.4 oz (125.828 kg)    Appointment on 10/27/2012  Component Date Value Range Status  . Sodium 10/27/2012 145  135 - 145 mEq/L Final  . Potassium 10/27/2012 4.5  3.5 - 5.1 mEq/L Final  . Chloride 10/27/2012 109  96 - 112 mEq/L Final  . CO2 10/27/2012 28  19 - 32 mEq/L Final  . Glucose, Bld 10/27/2012 95  70 - 99 mg/dL Final  . BUN 08/65/7846 45* 6 - 23 mg/dL Final  . Creatinine, Ser 10/27/2012 1.6* 0.4 - 1.2 mg/dL Final  . Calcium 96/29/5284 9.9  8.4 - 10.5 mg/dL Final  . GFR 13/24/4010 40.36* >60.00 mL/min Final  . Fructosamine 10/27/2012 230  <285 umol/L Final   Comment:                            Variations in levels of serum proteins (albumin and immunoglobulins)                          may affect fructosamine results.                             Marland Kitchen  TSH 10/27/2012 3.51  0.35 - 5.50 uIU/mL Final      Medication List       This list is accurate as of: 11/01/12 11:19 AM.  Always use your most recent med list.               ADVAIR DISKUS 250-50 MCG/DOSE Aepb  Generic drug:  Fluticasone-Salmeterol  Inhale 1 puff into the lungs every 12 (twelve) hours.     albuterol 108 (90 BASE) MCG/ACT inhaler  Commonly known as:  PROVENTIL HFA;VENTOLIN HFA  Inhale 2 puffs into the lungs every 6 (six) hours as needed.     allopurinol 100 MG tablet  Commonly known as:  ZYLOPRIM  Take 1 tablet (100 mg total) by mouth daily.     aspirin 81 MG tablet  Take 81 mg by mouth daily.     diltiazem 300 MG 24 hr capsule  Commonly known as:  CARDIZEM CD  Take 300 mg by mouth daily.     docusate sodium 100 MG capsule  Commonly known as:  COLACE  Take 100 mg by mouth 2 (two) times daily as needed.     ergocalciferol 50000 UNITS capsule  Commonly known as:  VITAMIN D2  Take 50,000 Units by mouth once a week.     fenofibrate micronized 43 MG capsule  Commonly known as:   ANTARA  Take 1 capsule (43 mg total) by mouth daily before breakfast.     ferrous sulfate 325 (65 FE) MG tablet  Take 325 mg by mouth 2 (two) times daily.     furosemide 80 MG tablet  Commonly known as:  LASIX  Take 80 mg by mouth daily.     insulin aspart 100 UNIT/ML injection  Commonly known as:  novoLOG  4 units before breakfast, 6 units before lunch, 22 units before supper     insulin glargine 100 UNIT/ML injection  Commonly known as:  LANTUS  Inject 46 Units into the skin 2 (two) times daily. 46 units Qam, 22 units before supper     Insulin Syringe-Needle U-100 31G X 5/16" 1 ML Misc  Commonly known as:  B-D INS SYR ULTRAFINE 1CC/31G  Inject 1 each into the skin 3 (three) times daily.     levothyroxine 25 MCG tablet  Commonly known as:  SYNTHROID, LEVOTHROID  Take 1 tablet (25 mcg total) by mouth daily.     losartan 100 MG tablet  Commonly known as:  COZAAR  Take 1 tablet (100 mg total) by mouth daily.     montelukast 10 MG tablet  Commonly known as:  SINGULAIR  Take 10 mg by mouth at bedtime.     omeprazole 20 MG capsule  Commonly known as:  PRILOSEC  Take 30- 60 min before your first and last meals of the day     ONE TOUCH ULTRA TEST test strip  Generic drug:  glucose blood     polyethylene glycol packet  Commonly known as:  MIRALAX / GLYCOLAX  MIX TO TAKE 17GM AS NEEDED AS DIRECTED     pravastatin 20 MG tablet  Commonly known as:  PRAVACHOL  Take 20 mg by mouth daily.     predniSONE 10 MG tablet  Commonly known as:  STERAPRED UNI-PAK  Take 10 mg by mouth daily. Will complete on 09/16/12     PROCRIT IJ  Inject 40,000 Units as directed every 30 (thirty) days.     sulfamethoxazole-trimethoprim 800-160 MG per tablet  Commonly known as:  BACTRIM DS  Take 1 tablet by mouth daily. Not sure of tablet strength; takes once a day; will complete on 09/18/12        Allergies:  Allergies  Allergen Reactions  . Percocet [Oxycodone-Acetaminophen] Nausea And  Vomiting  . Codeine   . Zantac [Ranitidine Hcl]     Past Medical History  Diagnosis Date  . Hypertension   . Heart failure   . Asthma   . Diabetes mellitus, type 2   . Sleep apnea     Past Surgical History  Procedure Laterality Date  . Cholecystectomy  1973  . Total abdominal hysterectomy  1982  . Tubal ligation  1973  . Breast reduction surgery  2002    Family History  Problem Relation Age of Onset  . Asthma Sister   . Asthma Mother   . Heart disease Father   . Cancer Sister     breast cancer  . Cancer Brother     kidney    Social History:  reports that she quit smoking about 48 years ago. Her smoking use included Cigarettes. She has a 2.7 pack-year smoking history. She does not have any smokeless tobacco history on file. She reports that she does not drink alcohol or use illicit drugs.  Review of Systems:  HYPERTENSION:  long-standing and well controlled  HYPOTHYROID: TSH normal with taking small dose supplement  No recent pedal edema  She has not had any steroids recently for her COPD  HYPERLIPIDEMIA: The lipid abnormality consists mostly of high triglycerides, now well controlled      Examination:   BP 128/70  Pulse 60  Temp(Src) 98.7 F (37.1 C)  Resp 14  Ht 5\' 8"  (1.727 m)  Wt 281 lb 1.6 oz (127.506 kg)  BMI 42.75 kg/m2  SpO2 98%  Body mass index is 42.75 kg/(m^2).     ASSESSMENT/ PLAN::   Diabetes type 2   The patient's diabetes  is overall better but she is having more tendency to hyperglycemia Most of her lower readings are in the afternoon and evenings and will need to reduce her Lantus to 44 units in the morning She was given instructions on eating more balanced meals in the morning at breakfast with less fruit and more protein Since she has higher readings when eating out discussed adding more insulin for this and taking the NovoLog with her Also would reduce her suppertime dose by 2 units to avoid low normal readings at  night  HYPERTENSION: Well controlled, to continue same medications  Hyperlipidemia: She has had history of high triglycerides and low HDL, well controlled with fenofibrate and pravastatin   Weber Monnier 11/01/2012, 11:19 AM    Appointment on 10/27/2012  Component Date Value Range Status  . Sodium 10/27/2012 145  135 - 145 mEq/L Final  . Potassium 10/27/2012 4.5  3.5 - 5.1 mEq/L Final  . Chloride 10/27/2012 109  96 - 112 mEq/L Final  . CO2 10/27/2012 28  19 - 32 mEq/L Final  . Glucose, Bld 10/27/2012 95  70 - 99 mg/dL Final  . BUN 86/57/8469 45* 6 - 23 mg/dL Final  . Creatinine, Ser 10/27/2012 1.6* 0.4 - 1.2 mg/dL Final  . Calcium 62/95/2841 9.9  8.4 - 10.5 mg/dL Final  . GFR 32/44/0102 40.36* >60.00 mL/min Final  . Fructosamine 10/27/2012 230  <285 umol/L Final   Comment:  Variations in levels of serum proteins (albumin and immunoglobulins)                          may affect fructosamine results.                             Marland Kitchen TSH 10/27/2012 3.51  0.35 - 5.50 uIU/mL Final

## 2012-11-01 NOTE — Patient Instructions (Signed)
Less fruit at Bfst, more protein; Add 2-4 units more NOVOLOG WHEN eating out; may dose after meal  NOVOLOG 20 AT SUPPER  AM Lantus 44 units

## 2012-11-09 ENCOUNTER — Encounter: Payer: Self-pay | Admitting: Podiatry

## 2012-11-09 ENCOUNTER — Ambulatory Visit (INDEPENDENT_AMBULATORY_CARE_PROVIDER_SITE_OTHER): Payer: Medicare PPO | Admitting: Podiatry

## 2012-11-09 VITALS — BP 154/64 | HR 60 | Resp 16

## 2012-11-09 DIAGNOSIS — M79609 Pain in unspecified limb: Secondary | ICD-10-CM

## 2012-11-09 DIAGNOSIS — Q828 Other specified congenital malformations of skin: Secondary | ICD-10-CM

## 2012-11-09 DIAGNOSIS — B351 Tinea unguium: Secondary | ICD-10-CM

## 2012-11-09 NOTE — Progress Notes (Signed)
Ms Vine presents today chief complaint of painful toenails and calluses bilateral.  Objective: Vital signs are stable she is alert and oriented x3. Pulses are palpable bilateral. Nails are thick yellow dystrophic clinically mycotic. Hammertoe deformities are noted 2 through 5 bilateral. Reactive hyperkeratosis is also noted plantar aspect of the fifth metatarsal heads bilateral. As well as the dorsal lateral aspect of the fifth metatarsal head of the right foot. She also has hammertoe deformity fifth left this resulting in a reactive hyperkeratosis there.  Assessment: Pain in limb secondary to onychomycosis 1 through 5 bilaterally as well as painful calluses bilateral.  Plan: Debridement of nails and calluses 1 through 5 bilateral.

## 2012-11-16 ENCOUNTER — Other Ambulatory Visit (HOSPITAL_BASED_OUTPATIENT_CLINIC_OR_DEPARTMENT_OTHER): Payer: Medicare PPO | Admitting: Lab

## 2012-11-16 ENCOUNTER — Ambulatory Visit (HOSPITAL_BASED_OUTPATIENT_CLINIC_OR_DEPARTMENT_OTHER): Payer: Medicare PPO

## 2012-11-16 VITALS — BP 143/46 | HR 54 | Temp 98.5°F | Resp 18

## 2012-11-16 DIAGNOSIS — D631 Anemia in chronic kidney disease: Secondary | ICD-10-CM

## 2012-11-16 DIAGNOSIS — N259 Disorder resulting from impaired renal tubular function, unspecified: Secondary | ICD-10-CM

## 2012-11-16 DIAGNOSIS — N189 Chronic kidney disease, unspecified: Secondary | ICD-10-CM

## 2012-11-16 DIAGNOSIS — D649 Anemia, unspecified: Secondary | ICD-10-CM

## 2012-11-16 LAB — CBC WITH DIFFERENTIAL/PLATELET
BASO%: 0.6 % (ref 0.0–2.0)
Basophils Absolute: 0 10*3/uL (ref 0.0–0.1)
EOS%: 4 % (ref 0.0–7.0)
HCT: 34.2 % — ABNORMAL LOW (ref 34.8–46.6)
HGB: 10.8 g/dL — ABNORMAL LOW (ref 11.6–15.9)
MCH: 27.4 pg (ref 25.1–34.0)
MCHC: 31.6 g/dL (ref 31.5–36.0)
MONO#: 0.5 10*3/uL (ref 0.1–0.9)
RDW: 15.4 % — ABNORMAL HIGH (ref 11.2–14.5)
WBC: 6.2 10*3/uL (ref 3.9–10.3)
lymph#: 2.3 10*3/uL (ref 0.9–3.3)

## 2012-11-16 MED ORDER — EPOETIN ALFA 40000 UNIT/ML IJ SOLN
40000.0000 [IU] | INTRAMUSCULAR | Status: DC
  Administered 2012-11-16: 40000 [IU] via SUBCUTANEOUS
  Filled 2012-11-16: qty 1

## 2012-12-06 ENCOUNTER — Other Ambulatory Visit: Payer: Self-pay | Admitting: *Deleted

## 2012-12-06 MED ORDER — ERGOCALCIFEROL 1.25 MG (50000 UT) PO CAPS: 50000.0000 [IU] | ORAL_CAPSULE | ORAL | Status: DC

## 2012-12-07 ENCOUNTER — Ambulatory Visit: Payer: Medicare PPO

## 2012-12-07 ENCOUNTER — Other Ambulatory Visit (HOSPITAL_BASED_OUTPATIENT_CLINIC_OR_DEPARTMENT_OTHER): Payer: Medicare PPO | Admitting: Lab

## 2012-12-07 DIAGNOSIS — N259 Disorder resulting from impaired renal tubular function, unspecified: Secondary | ICD-10-CM

## 2012-12-07 DIAGNOSIS — D649 Anemia, unspecified: Secondary | ICD-10-CM

## 2012-12-07 DIAGNOSIS — N289 Disorder of kidney and ureter, unspecified: Secondary | ICD-10-CM

## 2012-12-07 DIAGNOSIS — D631 Anemia in chronic kidney disease: Secondary | ICD-10-CM

## 2012-12-07 LAB — CBC WITH DIFFERENTIAL/PLATELET
Basophils Absolute: 0 10*3/uL (ref 0.0–0.1)
Eosinophils Absolute: 0.1 10*3/uL (ref 0.0–0.5)
HGB: 11.4 g/dL — ABNORMAL LOW (ref 11.6–15.9)
MONO#: 0.4 10*3/uL (ref 0.1–0.9)
NEUT#: 3.8 10*3/uL (ref 1.5–6.5)
RBC: 4.23 10*6/uL (ref 3.70–5.45)
RDW: 15.1 % — ABNORMAL HIGH (ref 11.2–14.5)
WBC: 5.8 10*3/uL (ref 3.9–10.3)

## 2012-12-07 MED ORDER — EPOETIN ALFA 40000 UNIT/ML IJ SOLN: 40000.0000 [IU] | INTRAMUSCULAR | Status: DC

## 2012-12-28 ENCOUNTER — Ambulatory Visit (HOSPITAL_BASED_OUTPATIENT_CLINIC_OR_DEPARTMENT_OTHER): Payer: Medicare PPO

## 2012-12-28 ENCOUNTER — Other Ambulatory Visit (HOSPITAL_BASED_OUTPATIENT_CLINIC_OR_DEPARTMENT_OTHER): Payer: Medicare PPO

## 2012-12-28 VITALS — BP 142/51 | HR 63 | Temp 98.6°F

## 2012-12-28 DIAGNOSIS — N259 Disorder resulting from impaired renal tubular function, unspecified: Secondary | ICD-10-CM

## 2012-12-28 DIAGNOSIS — D649 Anemia, unspecified: Secondary | ICD-10-CM

## 2012-12-28 DIAGNOSIS — N189 Chronic kidney disease, unspecified: Secondary | ICD-10-CM

## 2012-12-28 DIAGNOSIS — D631 Anemia in chronic kidney disease: Secondary | ICD-10-CM

## 2012-12-28 LAB — CBC WITH DIFFERENTIAL/PLATELET
Basophils Absolute: 0 10*3/uL (ref 0.0–0.1)
Eosinophils Absolute: 0.4 10*3/uL (ref 0.0–0.5)
HCT: 34.7 % — ABNORMAL LOW (ref 34.8–46.6)
HGB: 10.9 g/dL — ABNORMAL LOW (ref 11.6–15.9)
LYMPH%: 32.2 % (ref 14.0–49.7)
MONO#: 0.4 10*3/uL (ref 0.1–0.9)
NEUT#: 3.9 10*3/uL (ref 1.5–6.5)
NEUT%: 56.8 % (ref 38.4–76.8)
Platelets: 170 10*3/uL (ref 145–400)
WBC: 6.8 10*3/uL (ref 3.9–10.3)
nRBC: 0 % (ref 0–0)

## 2012-12-28 MED ORDER — EPOETIN ALFA 40000 UNIT/ML IJ SOLN
40000.0000 [IU] | INTRAMUSCULAR | Status: DC
  Administered 2012-12-28: 40000 [IU] via SUBCUTANEOUS
  Filled 2012-12-28: qty 1

## 2013-01-07 ENCOUNTER — Other Ambulatory Visit: Payer: Self-pay | Admitting: *Deleted

## 2013-01-07 MED ORDER — FENOFIBRATE MICRONIZED 43 MG PO CAPS: 43.0000 mg | ORAL_CAPSULE | Freq: Every day | ORAL | Status: DC

## 2013-01-18 ENCOUNTER — Ambulatory Visit (HOSPITAL_BASED_OUTPATIENT_CLINIC_OR_DEPARTMENT_OTHER): Payer: Medicare PPO | Admitting: Oncology

## 2013-01-18 ENCOUNTER — Other Ambulatory Visit (HOSPITAL_BASED_OUTPATIENT_CLINIC_OR_DEPARTMENT_OTHER): Payer: Medicare PPO

## 2013-01-18 ENCOUNTER — Other Ambulatory Visit: Payer: Self-pay | Admitting: *Deleted

## 2013-01-18 ENCOUNTER — Telehealth: Payer: Self-pay | Admitting: Oncology

## 2013-01-18 ENCOUNTER — Ambulatory Visit: Payer: Medicare PPO

## 2013-01-18 VITALS — BP 144/54 | HR 54 | Temp 98.4°F | Resp 20 | Ht 68.0 in | Wt 275.9 lb

## 2013-01-18 DIAGNOSIS — D631 Anemia in chronic kidney disease: Secondary | ICD-10-CM

## 2013-01-18 DIAGNOSIS — M79609 Pain in unspecified limb: Secondary | ICD-10-CM

## 2013-01-18 DIAGNOSIS — D649 Anemia, unspecified: Secondary | ICD-10-CM

## 2013-01-18 DIAGNOSIS — N039 Chronic nephritic syndrome with unspecified morphologic changes: Secondary | ICD-10-CM

## 2013-01-18 DIAGNOSIS — N189 Chronic kidney disease, unspecified: Secondary | ICD-10-CM

## 2013-01-18 DIAGNOSIS — N259 Disorder resulting from impaired renal tubular function, unspecified: Secondary | ICD-10-CM

## 2013-01-18 DIAGNOSIS — M109 Gout, unspecified: Secondary | ICD-10-CM

## 2013-01-18 LAB — CBC WITH DIFFERENTIAL/PLATELET
BASO%: 0.5 % (ref 0.0–2.0)
Basophils Absolute: 0 10*3/uL (ref 0.0–0.1)
EOS ABS: 0.1 10*3/uL (ref 0.0–0.5)
EOS%: 2.1 % (ref 0.0–7.0)
HCT: 35.2 % (ref 34.8–46.6)
HGB: 11.1 g/dL — ABNORMAL LOW (ref 11.6–15.9)
LYMPH%: 25.4 % (ref 14.0–49.7)
MCH: 26.7 pg (ref 25.1–34.0)
MCHC: 31.5 g/dL (ref 31.5–36.0)
MCV: 84.8 fL (ref 79.5–101.0)
MONO#: 0.4 10*3/uL (ref 0.1–0.9)
MONO%: 6.2 % (ref 0.0–14.0)
NEUT%: 65.8 % (ref 38.4–76.8)
NEUTROS ABS: 4.1 10*3/uL (ref 1.5–6.5)
NRBC: 0 % (ref 0–0)
Platelets: 184 10*3/uL (ref 145–400)
RBC: 4.15 10*6/uL (ref 3.70–5.45)
RDW: 15.1 % — AB (ref 11.2–14.5)
WBC: 6.3 10*3/uL (ref 3.9–10.3)
lymph#: 1.6 10*3/uL (ref 0.9–3.3)

## 2013-01-18 MED ORDER — EPOETIN ALFA 40000 UNIT/ML IJ SOLN: 40000.0000 [IU] | INTRAMUSCULAR | Status: DC

## 2013-01-18 NOTE — Telephone Encounter (Signed)
gv and printed appt sched and avs for pt for Jan thru May 2015

## 2013-01-18 NOTE — Progress Notes (Signed)
   [name]    OFFICE PROGRESS NOTE   INTERVAL HISTORY:   She returns for scheduled followup of anemia. She has been maintained on every 3 week Procrit, last given 12/28/2012. The heart rate has been slow recently and she reports her cardiologist may decrease the dose as of dose. She reports her pulse has been running in the low 50s at home.  [name] complains of pain and swelling at the left foot for the past 3-4 days. She is scheduled to see her podiatrist later this week. The pain is better today.  Objective:  Vital signs in last 24 hours:  Blood pressure 144/54, pulse 54, temperature 98.4 F (36.9 C), temperature source Oral, resp. rate 20, height 5\' 8"  (1.727 m), weight 275 lb 14.4 oz (125.147 kg), SpO2 96.00%. repeat manual pulse 48    Resp: Lungs clear bilaterally Cardio: Regular rate and rhythm, 2/6 systolic murmur GI: No hepatosplenomegaly, nontender Vascular: Chronic stasis change at the lower leg bilaterally,? Trace edema at the left greater than right lower leg  Musculoskeletal: Tenderness and swelling at the upper dorsum of the left foot and ankle. Faint erythema.    Lab Results:  Lab Results  Component Value Date   WBC 6.3 01/18/2013   HGB 11.1* 01/18/2013   HCT 35.2 01/18/2013   MCV 84.8 01/18/2013   PLT 184 01/18/2013   NEUTROABS 4.1 01/18/2013      Medications: I have reviewed the patient's current medications.  Assessment/Plan: 1. Chronic anemia secondary to renal insufficiency, stable. The goal is to maintain the hemoglobin between 10 and 11.  2. Renal insufficiency, followed by Dr. Lorrene Reid. 3. Hypothyroidism. 4. Chronic fatigue. 5. History of a duodenal polyp, status post a biopsy 06/12/2005 with the pathology confirming chronic duodenitis. 6. Diabetes. 7. Hypertension. 8. Asthma.  9. Congestive heart failure.  10. Bradycardia-she will report the low heart rate to her cardiologist. She says her cardiologist is aware of recent  bradycardia. 11. Pain/swelling at the left foot-probable gout flare  Disposition:  The hemoglobin is slightly above the goal range today and Procrit will be held. The plan is to continue Procrit on a 3 week schedule. She will return for an office visit in 4 months. We decided against Indocin secondary to her renal insufficiency. The apparent gout flare has been present for the past 3-4 days making it less likely that she would benefit from colchicine. She reports hyperglycemia with prednisone in the past. She will seek medical attention for increased erythema at the left foot or leg. She is scheduled to see her podiatrist later this week. I have a low clinical suspicion for a deep vein thrombosis.   [name] Coder, MD  01/18/2013  11:32 AM

## 2013-01-20 ENCOUNTER — Ambulatory Visit: Payer: Medicare PPO | Admitting: Podiatry

## 2013-01-31 ENCOUNTER — Other Ambulatory Visit: Payer: Medicare PPO

## 2013-02-01 ENCOUNTER — Other Ambulatory Visit: Payer: Medicare PPO

## 2013-02-02 ENCOUNTER — Ambulatory Visit: Payer: Medicare PPO | Admitting: Endocrinology

## 2013-02-07 ENCOUNTER — Other Ambulatory Visit: Payer: Self-pay | Admitting: *Deleted

## 2013-02-07 ENCOUNTER — Other Ambulatory Visit (INDEPENDENT_AMBULATORY_CARE_PROVIDER_SITE_OTHER): Payer: Medicare PPO

## 2013-02-07 DIAGNOSIS — IMO0001 Reserved for inherently not codable concepts without codable children: Secondary | ICD-10-CM

## 2013-02-07 DIAGNOSIS — E1165 Type 2 diabetes mellitus with hyperglycemia: Principal | ICD-10-CM

## 2013-02-07 LAB — COMPREHENSIVE METABOLIC PANEL
ALK PHOS: 63 U/L (ref 39–117)
ALT: 15 U/L (ref 0–35)
AST: 18 U/L (ref 0–37)
Albumin: 3.6 g/dL (ref 3.5–5.2)
BUN: 39 mg/dL — AB (ref 6–23)
CALCIUM: 9.3 mg/dL (ref 8.4–10.5)
CHLORIDE: 106 meq/L (ref 96–112)
CO2: 27 mEq/L (ref 19–32)
CREATININE: 1.5 mg/dL — AB (ref 0.4–1.2)
GFR: 43.81 mL/min — ABNORMAL LOW (ref >60.00)
Glucose, Bld: 86 mg/dL (ref 70–99)
Potassium: 4.2 mEq/L (ref 3.5–5.1)
Sodium: 142 mEq/L (ref 135–145)
Total Bilirubin: 0.3 mg/dL (ref 0.3–1.2)
Total Protein: 6.9 g/dL (ref 6.0–8.3)

## 2013-02-07 LAB — LIPID PANEL
Cholesterol: 118 mg/dL (ref 0–200)
HDL: 37.4 mg/dL — AB (ref >39.00)
LDL Cholesterol: 59 mg/dL (ref 0–99)
TRIGLYCERIDES: 107 mg/dL (ref 0.0–149.0)
Total CHOL/HDL Ratio: 3
VLDL: 21.4 mg/dL (ref 0.0–40.0)

## 2013-02-07 LAB — HEMOGLOBIN A1C: HEMOGLOBIN A1C: 5.8 % (ref 4.6–6.5)

## 2013-02-07 MED ORDER — FENOFIBRATE MICRONIZED 43 MG PO CAPS: 43.0000 mg | ORAL_CAPSULE | Freq: Every day | ORAL | Status: DC

## 2013-02-08 ENCOUNTER — Ambulatory Visit (HOSPITAL_BASED_OUTPATIENT_CLINIC_OR_DEPARTMENT_OTHER): Payer: Medicare PPO

## 2013-02-08 ENCOUNTER — Ambulatory Visit (INDEPENDENT_AMBULATORY_CARE_PROVIDER_SITE_OTHER): Payer: Medicare PPO | Admitting: Podiatry

## 2013-02-08 ENCOUNTER — Other Ambulatory Visit (HOSPITAL_BASED_OUTPATIENT_CLINIC_OR_DEPARTMENT_OTHER): Payer: Medicare PPO

## 2013-02-08 ENCOUNTER — Encounter: Payer: Self-pay | Admitting: Podiatry

## 2013-02-08 VITALS — BP 121/61 | HR 60 | Resp 20

## 2013-02-08 VITALS — BP 146/53 | HR 59 | Temp 97.9°F

## 2013-02-08 DIAGNOSIS — B351 Tinea unguium: Secondary | ICD-10-CM

## 2013-02-08 DIAGNOSIS — N189 Chronic kidney disease, unspecified: Secondary | ICD-10-CM

## 2013-02-08 DIAGNOSIS — N039 Chronic nephritic syndrome with unspecified morphologic changes: Secondary | ICD-10-CM

## 2013-02-08 DIAGNOSIS — D631 Anemia in chronic kidney disease: Secondary | ICD-10-CM

## 2013-02-08 DIAGNOSIS — E119 Type 2 diabetes mellitus without complications: Secondary | ICD-10-CM

## 2013-02-08 DIAGNOSIS — M79609 Pain in unspecified limb: Secondary | ICD-10-CM

## 2013-02-08 DIAGNOSIS — Q828 Other specified congenital malformations of skin: Secondary | ICD-10-CM

## 2013-02-08 LAB — CBC WITH DIFFERENTIAL/PLATELET
BASO%: 0.1 % (ref 0.0–2.0)
BASOS ABS: 0 10*3/uL (ref 0.0–0.1)
EOS%: 5.2 % (ref 0.0–7.0)
Eosinophils Absolute: 0.4 10*3/uL (ref 0.0–0.5)
HEMATOCRIT: 33.4 % — AB (ref 34.8–46.6)
HGB: 10.6 g/dL — ABNORMAL LOW (ref 11.6–15.9)
LYMPH%: 31.6 % (ref 14.0–49.7)
MCH: 26.8 pg (ref 25.1–34.0)
MCHC: 31.7 g/dL (ref 31.5–36.0)
MCV: 84.3 fL (ref 79.5–101.0)
MONO#: 0.5 10*3/uL (ref 0.1–0.9)
MONO%: 6.4 % (ref 0.0–14.0)
NEUT#: 4 10*3/uL (ref 1.5–6.5)
NEUT%: 56.7 % (ref 38.4–76.8)
PLATELETS: 159 10*3/uL (ref 145–400)
RBC: 3.96 10*6/uL (ref 3.70–5.45)
RDW: 15.3 % — ABNORMAL HIGH (ref 11.2–14.5)
WBC: 7.1 10*3/uL (ref 3.9–10.3)
lymph#: 2.2 10*3/uL (ref 0.9–3.3)
nRBC: 0 % (ref 0–0)

## 2013-02-08 MED ORDER — EPOETIN ALFA 40000 UNIT/ML IJ SOLN
40000.0000 [IU] | INTRAMUSCULAR | Status: DC
  Administered 2013-02-08: 40000 [IU] via SUBCUTANEOUS
  Filled 2013-02-08: qty 1

## 2013-02-08 NOTE — Progress Notes (Signed)
Toenails and calluses and they are hurting today.  Objective: Vital signs are stable she is alert and oriented x3. Nails are thick yellow dystrophic onychomycotic bilateral. Severe thick porokeratotic lesions and calluses to the plantar aspect of the bilateral foot. No open ulcerations. Pulses are normal bilateral.  Assessment: Non-insulin-dependent diabetes mellitus with pain in limb secondary to onychomycosis 1 through 5 bilateral and calluses plantar aspect of the bilateral foot.  Plan: Debridement of all reactive hyperkeratosis plantar aspect of the bilateral foot and debridement of nails 1 through 5 bilateral.

## 2013-02-09 ENCOUNTER — Other Ambulatory Visit: Payer: Self-pay | Admitting: *Deleted

## 2013-02-09 MED ORDER — FENOFIBRATE MICRONIZED 43 MG PO CAPS: 43.0000 mg | ORAL_CAPSULE | Freq: Every day | ORAL | Status: DC

## 2013-02-10 ENCOUNTER — Ambulatory Visit: Payer: Medicare PPO | Admitting: Endocrinology

## 2013-02-11 ENCOUNTER — Other Ambulatory Visit: Payer: Self-pay | Admitting: *Deleted

## 2013-02-18 ENCOUNTER — Other Ambulatory Visit: Payer: Self-pay | Admitting: *Deleted

## 2013-02-18 MED ORDER — INSULIN GLARGINE 100 UNIT/ML ~~LOC~~ SOLN: 46.0000 [IU] | Freq: Two times a day (BID) | SUBCUTANEOUS | Status: DC

## 2013-02-28 ENCOUNTER — Encounter: Payer: Self-pay | Admitting: Endocrinology

## 2013-02-28 ENCOUNTER — Ambulatory Visit (INDEPENDENT_AMBULATORY_CARE_PROVIDER_SITE_OTHER): Payer: Medicare PPO | Admitting: Endocrinology

## 2013-02-28 VITALS — BP 128/82 | HR 59 | Temp 97.7°F | Resp 18 | Ht 68.0 in | Wt 276.2 lb

## 2013-02-28 DIAGNOSIS — E1165 Type 2 diabetes mellitus with hyperglycemia: Principal | ICD-10-CM

## 2013-02-28 DIAGNOSIS — E039 Hypothyroidism, unspecified: Secondary | ICD-10-CM | POA: Insufficient documentation

## 2013-02-28 DIAGNOSIS — E785 Hyperlipidemia, unspecified: Secondary | ICD-10-CM

## 2013-02-28 DIAGNOSIS — IMO0001 Reserved for inherently not codable concepts without codable children: Secondary | ICD-10-CM

## 2013-02-28 DIAGNOSIS — I10 Essential (primary) hypertension: Secondary | ICD-10-CM

## 2013-02-28 NOTE — Progress Notes (Signed)
Patient ID: [name], female   DOB: 25-Aug-1932, 80 y.o.   MRN: [id number]   Reason for Appointment: Diabetes follow-up   History of Present Illness   Diagnosis: Type 2 DIABETES MELITUS, date of diagnosis:  1997  Past history: She was started on insulin within a year of diagnosis; had diarrhea from metformin and swelling from Actos Generally her blood sugars are fairly well-controlled with basal bolus insulin regimen and A1c mostly below 7  RECENT history:  Insulin regimen: Lantus 44 units a.m., 20 units PM and NovoLog  04-12-18 a.c.          She has not been seen in followup since 10/2012 when her blood sugars were fluctuating more and periodically higher from steroids However in the last month she had occasional hypoglycemia including one episode of severe hypoglycemia This is despite reducing her Lantus on the last visit as well as suppertime NovoLog Difficult to assess her pattern as she is checking blood sugars primarily before her first meal and late at night mostly She is fairly compliant with doing her insulin as directed before meals and Lantus twice a day  Hypoglycemia :  none recently, had a glucose of 42 at about 4 PM and a glucose of 50 at midnight late last month  Her A1c is frequently not consistent with her blood sugars because of anemia and renal insufficiency      Proper timing of medications in relation to meals: Yes.         Monitors blood glucose: Once a day.    Glucometer: One Touch.  PREMEAL Breakfast Lunch Dinner  late p.m.  Overall  Glucose range:  74-136  ?   86-155   50-227    Mean/median:      100     Meals:  2-3 meals per day.  breakfast: oatmeal, fruit. Supper 7 pm   Physical activity:  unable to do much            Dietician visit: Most recent: 0/16          Complications: are: ? Nephropathy, last microalbumin/ creatinine ratio was 87 in 2/14  The last HbgA1c  report is not available  Wt Readings from Last 3 Encounters:  02/28/13 276 lb 3.2 oz  (125.283 kg)  01/18/13 275 lb 14.4 oz (125.147 kg)  11/01/12 281 lb 1.6 oz (127.506 kg)   Lab Results  Component Value Date   HGBA1C 5.8 02/07/2013   HGBA1C 6.3 09/13/2012   HGBA1C  Value: 6.2 (NOTE)                                                                       According to the ADA Clinical Practice Recommendations for 2011, when HbA1c is used as a screening test:   >=6.5%   Diagnostic of Diabetes Mellitus           (if abnormal result  is confirmed)  5.7-6.4%   Increased risk of developing Diabetes Mellitus  References:Diagnosis and Classification of Diabetes Mellitus,Diabetes WFUX,3235,57(DUKGU 1):S62-S69 and Standards of Medical Care in         Diabetes - 2011,Diabetes Care,2011,34  (Suppl 1):S11-S61.* 02/06/2010   Lab Results  Component Value Date   MICROALBUR 5.8* 09/13/2012  LDLCALC 59 02/07/2013   CREATININE 1.5* 02/07/2013         Medication List       This list is accurate as of: 02/28/13  3:42 PM.  Always use your most recent med list.               ADVAIR DISKUS 250-50 MCG/DOSE Aepb  Generic drug:  Fluticasone-Salmeterol  Inhale 1 puff into the lungs every 12 (twelve) hours.     albuterol 108 (90 BASE) MCG/ACT inhaler  Commonly known as:  PROVENTIL HFA;VENTOLIN HFA  Inhale 2 puffs into the lungs every 6 (six) hours as needed.     allopurinol 100 MG tablet  Commonly known as:  ZYLOPRIM  Take 1 tablet (100 mg total) by mouth daily.     aspirin 81 MG tablet  Take 81 mg by mouth daily.     diltiazem 300 MG 24 hr capsule  Commonly known as:  CARDIZEM CD  Take 300 mg by mouth daily.     docusate sodium 100 MG capsule  Commonly known as:  COLACE  Take 100 mg by mouth 2 (two) times daily as needed.     ergocalciferol 50000 UNITS capsule  Commonly known as:  VITAMIN D2  Take 1 capsule (50,000 Units total) by mouth once a week.     fenofibrate micronized 43 MG capsule  Commonly known as:  ANTARA  Take 1 capsule (43 mg total) by mouth daily before breakfast.      ferrous sulfate 325 (65 FE) MG tablet  Take 325 mg by mouth 2 (two) times daily.     furosemide 80 MG tablet  Commonly known as:  LASIX  Take 80 mg by mouth daily.     insulin aspart 100 UNIT/ML injection  Commonly known as:  novoLOG  4 units before breakfast, 6 units before lunch, 20 units before supper     insulin glargine 100 UNIT/ML injection  Commonly known as:  LANTUS  Inject 44 Units into the skin 2 (two) times daily. 44 units Qam, 20 units before supper     Insulin Syringe-Needle U-100 31G X 5/16" 1 ML Misc  Commonly known as:  B-D INS SYR ULTRAFINE 1CC/31G  Inject 1 each into the skin 3 (three) times daily.     levothyroxine 25 MCG tablet  Commonly known as:  SYNTHROID, LEVOTHROID  Take 1 tablet (25 mcg total) by mouth daily.     losartan 100 MG tablet  Commonly known as:  COZAAR  Take 1 tablet (100 mg total) by mouth daily.     montelukast 10 MG tablet  Commonly known as:  SINGULAIR  Take 10 mg by mouth at bedtime.     omeprazole 20 MG capsule  Commonly known as:  PRILOSEC  Take 30- 60 min before your first and last meals of the day     ONE TOUCH ULTRA TEST test strip  Generic drug:  glucose blood     polyethylene glycol packet  Commonly known as:  MIRALAX / GLYCOLAX  MIX TO TAKE 17GM AS NEEDED AS DIRECTED     pravastatin 20 MG tablet  Commonly known as:  PRAVACHOL  Take 20 mg by mouth daily.     PROCRIT IJ  Inject 40,000 Units as directed every 30 (thirty) days.        Allergies:  Allergies  Allergen Reactions  . Percocet [Oxycodone-Acetaminophen] Nausea And Vomiting  . Codeine     Felt like "my head was going to  explode"  . Zantac [Ranitidine Hcl] Nausea And Vomiting    Past Medical History  Diagnosis Date  . Hypertension   . Heart failure   . Asthma   . Diabetes mellitus, type 2   . Sleep apnea     Past Surgical History  Procedure Laterality Date  . Cholecystectomy  1973  . Total abdominal hysterectomy  1982  . Tubal ligation   1973  . Breast reduction surgery  2002    Family History  Problem Relation Age of Onset  . Asthma Sister   . Asthma Mother   . Heart disease Father   . Cancer Sister     breast cancer  . Cancer Brother     kidney    Social History:  reports that she quit smoking about 48 years ago. Her smoking use included Cigarettes. She has a 2.7 pack-year smoking history. She does not have any smokeless tobacco history on file. She reports that she does not drink alcohol or use illicit drugs.  Review of Systems:  HYPERTENSION:  long-standing and well controlled with current regimen   HYPOTHYROID: TSH has been normal with taking small dose  of 25 mcg levothyroxine, will need followup level   Lab Results  Component Value Date   TSH 3.51 10/27/2012    Chronic kidney disease: Followed by nephrologist periodically   She has not had any steroids recently for her COPD  Continues to get Aranesp for her anemia periodically, last hemoglobin 10.6  HYPERLIPIDEMIA: The lipid abnormality consists mostly of high triglycerides, now well controlled; still has relatively low HDL  Lab Results  Component Value Date   CHOL 118 02/07/2013   HDL 37.40* 02/07/2013   LDLCALC 59 02/07/2013   TRIG 107.0 02/07/2013   CHOLHDL 3 02/07/2013        Examination:   BP 128/82  Pulse 59  Temp(Src) 97.7 F (36.5 C)  Resp 18  Ht 5\' 8"  (1.727 m)  Wt 276 lb 3.2 oz (125.283 kg)  BMI 42.01 kg/m2  SpO2 96%  Body mass index is 42.01 kg/(m^2).   She is alert and looks well No ankle edema  ASSESSMENT/ PLAN::   Diabetes type 2   The patient's diabetes  is overall better with blood sugars generally at target with only rare high readings from forgetting her NovoLog A1c is also relatively lower at 5.8; this may be not completely accurate because of her anemia and CKD Although she was having more hypoglycemia late last month this is not occurring now Overall her lowest blood sugars appear to be before breakfast Reminded  the patient how to treat hypoglycemia and she will keep glucose tablets with her when not at home She will also call if she is having more than a rare episode of low sugar  Insulin changes: Reduce Lantus in the evening to 16   HYPERTENSION: Well controlled, to continue same medications  Hyperlipidemia: She has had history of high triglycerides and low HDL, treated  with fenofibrate and pravastatin. Doses of fenofibrate limited by her renal function   Counseling time over 50% of today's 25 minute visit  [name] 02/28/2013, 3:42 PM

## 2013-02-28 NOTE — Patient Instructions (Addendum)
Please check blood sugars at least half the time about 2 hours after any meal and as directed on waking up. Please bring blood sugar monitor to each visit  Lantus in pm is 16  Call if getting more than an occasional low blood sugar. Treat low blood sugar with 4-6 ounces of juice or regular soft drinks, keep glucose tablets on U all the time

## 2013-03-01 ENCOUNTER — Ambulatory Visit: Payer: Medicare PPO

## 2013-03-01 ENCOUNTER — Other Ambulatory Visit: Payer: Medicare PPO

## 2013-03-02 ENCOUNTER — Other Ambulatory Visit: Payer: Self-pay | Admitting: *Deleted

## 2013-03-02 MED ORDER — LOSARTAN POTASSIUM 100 MG PO TABS: 100.0000 mg | ORAL_TABLET | Freq: Every day | ORAL | Status: DC

## 2013-03-22 ENCOUNTER — Other Ambulatory Visit (HOSPITAL_BASED_OUTPATIENT_CLINIC_OR_DEPARTMENT_OTHER): Payer: Medicare PPO

## 2013-03-22 ENCOUNTER — Ambulatory Visit (HOSPITAL_BASED_OUTPATIENT_CLINIC_OR_DEPARTMENT_OTHER): Payer: Medicare PPO

## 2013-03-22 VITALS — BP 150/42 | HR 55 | Temp 98.2°F

## 2013-03-22 DIAGNOSIS — N039 Chronic nephritic syndrome with unspecified morphologic changes: Secondary | ICD-10-CM

## 2013-03-22 DIAGNOSIS — N259 Disorder resulting from impaired renal tubular function, unspecified: Secondary | ICD-10-CM

## 2013-03-22 DIAGNOSIS — D631 Anemia in chronic kidney disease: Secondary | ICD-10-CM

## 2013-03-22 DIAGNOSIS — N189 Chronic kidney disease, unspecified: Secondary | ICD-10-CM

## 2013-03-22 DIAGNOSIS — D649 Anemia, unspecified: Secondary | ICD-10-CM

## 2013-03-22 LAB — CBC WITH DIFFERENTIAL/PLATELET
BASO%: 0.3 % (ref 0.0–2.0)
Basophils Absolute: 0 10*3/uL (ref 0.0–0.1)
EOS%: 6.5 % (ref 0.0–7.0)
Eosinophils Absolute: 0.4 10*3/uL (ref 0.0–0.5)
HEMATOCRIT: 33.1 % — AB (ref 34.8–46.6)
HGB: 10.4 g/dL — ABNORMAL LOW (ref 11.6–15.9)
LYMPH#: 1.9 10*3/uL (ref 0.9–3.3)
LYMPH%: 31.4 % (ref 14.0–49.7)
MCH: 26.5 pg (ref 25.1–34.0)
MCHC: 31.4 g/dL — ABNORMAL LOW (ref 31.5–36.0)
MCV: 84.4 fL (ref 79.5–101.0)
MONO#: 0.4 10*3/uL (ref 0.1–0.9)
MONO%: 6.4 % (ref 0.0–14.0)
NEUT#: 3.3 10*3/uL (ref 1.5–6.5)
NEUT%: 55.4 % (ref 38.4–76.8)
NRBC: 0 % (ref 0–0)
Platelets: 164 10*3/uL (ref 145–400)
RBC: 3.92 10*6/uL (ref 3.70–5.45)
RDW: 15.7 % — ABNORMAL HIGH (ref 11.2–14.5)
WBC: 6 10*3/uL (ref 3.9–10.3)

## 2013-03-22 MED ORDER — EPOETIN ALFA 40000 UNIT/ML IJ SOLN
40000.0000 [IU] | INTRAMUSCULAR | Status: DC
  Administered 2013-03-22: 40000 [IU] via SUBCUTANEOUS
  Filled 2013-03-22: qty 1

## 2013-04-04 ENCOUNTER — Other Ambulatory Visit: Payer: Self-pay | Admitting: Endocrinology

## 2013-04-12 ENCOUNTER — Ambulatory Visit: Payer: Medicare PPO

## 2013-04-12 ENCOUNTER — Other Ambulatory Visit (HOSPITAL_BASED_OUTPATIENT_CLINIC_OR_DEPARTMENT_OTHER): Payer: Medicare PPO

## 2013-04-12 DIAGNOSIS — N189 Chronic kidney disease, unspecified: Secondary | ICD-10-CM

## 2013-04-12 DIAGNOSIS — D631 Anemia in chronic kidney disease: Secondary | ICD-10-CM

## 2013-04-12 DIAGNOSIS — N259 Disorder resulting from impaired renal tubular function, unspecified: Secondary | ICD-10-CM

## 2013-04-12 DIAGNOSIS — D649 Anemia, unspecified: Secondary | ICD-10-CM

## 2013-04-12 DIAGNOSIS — N039 Chronic nephritic syndrome with unspecified morphologic changes: Secondary | ICD-10-CM

## 2013-04-12 LAB — CBC WITH DIFFERENTIAL/PLATELET
BASO%: 0.5 % (ref 0.0–2.0)
Basophils Absolute: 0 10*3/uL (ref 0.0–0.1)
EOS ABS: 0.3 10*3/uL (ref 0.0–0.5)
EOS%: 4.9 % (ref 0.0–7.0)
HCT: 35.1 % (ref 34.8–46.6)
HEMOGLOBIN: 11.1 g/dL — AB (ref 11.6–15.9)
LYMPH#: 2.1 10*3/uL (ref 0.9–3.3)
LYMPH%: 33.5 % (ref 14.0–49.7)
MCH: 27 pg (ref 25.1–34.0)
MCHC: 31.6 g/dL (ref 31.5–36.0)
MCV: 85.4 fL (ref 79.5–101.0)
MONO#: 0.5 10*3/uL (ref 0.1–0.9)
MONO%: 8.1 % (ref 0.0–14.0)
NEUT%: 53 % (ref 38.4–76.8)
NEUTROS ABS: 3.3 10*3/uL (ref 1.5–6.5)
Platelets: 199 10*3/uL (ref 145–400)
RBC: 4.11 10*6/uL (ref 3.70–5.45)
RDW: 15.2 % — AB (ref 11.2–14.5)
WBC: 6.3 10*3/uL (ref 3.9–10.3)
nRBC: 0 % (ref 0–0)

## 2013-04-12 MED ORDER — EPOETIN ALFA 40000 UNIT/ML IJ SOLN: 40000.0000 [IU] | INTRAMUSCULAR | Status: DC

## 2013-05-02 ENCOUNTER — Other Ambulatory Visit: Payer: Self-pay | Admitting: Endocrinology

## 2013-05-03 ENCOUNTER — Ambulatory Visit (HOSPITAL_BASED_OUTPATIENT_CLINIC_OR_DEPARTMENT_OTHER): Payer: Medicare PPO

## 2013-05-03 ENCOUNTER — Other Ambulatory Visit (HOSPITAL_BASED_OUTPATIENT_CLINIC_OR_DEPARTMENT_OTHER): Payer: Medicare PPO

## 2013-05-03 VITALS — BP 134/44 | HR 59 | Temp 98.6°F

## 2013-05-03 DIAGNOSIS — N039 Chronic nephritic syndrome with unspecified morphologic changes: Secondary | ICD-10-CM

## 2013-05-03 DIAGNOSIS — D631 Anemia in chronic kidney disease: Secondary | ICD-10-CM

## 2013-05-03 DIAGNOSIS — N189 Chronic kidney disease, unspecified: Secondary | ICD-10-CM

## 2013-05-03 LAB — CBC WITH DIFFERENTIAL/PLATELET
BASO%: 0.7 % (ref 0.0–2.0)
Basophils Absolute: 0.1 10*3/uL (ref 0.0–0.1)
EOS ABS: 0.3 10*3/uL (ref 0.0–0.5)
EOS%: 4.2 % (ref 0.0–7.0)
HEMATOCRIT: 34 % — AB (ref 34.8–46.6)
HGB: 10.9 g/dL — ABNORMAL LOW (ref 11.6–15.9)
LYMPH#: 2.1 10*3/uL (ref 0.9–3.3)
LYMPH%: 30.7 % (ref 14.0–49.7)
MCH: 27.3 pg (ref 25.1–34.0)
MCHC: 32.1 g/dL (ref 31.5–36.0)
MCV: 85.2 fL (ref 79.5–101.0)
MONO#: 0.5 10*3/uL (ref 0.1–0.9)
MONO%: 6.8 % (ref 0.0–14.0)
NEUT%: 57.6 % (ref 38.4–76.8)
NEUTROS ABS: 3.9 10*3/uL (ref 1.5–6.5)
NRBC: 0 % (ref 0–0)
Platelets: 165 10*3/uL (ref 145–400)
RBC: 3.99 10*6/uL (ref 3.70–5.45)
RDW: 15.5 % — ABNORMAL HIGH (ref 11.2–14.5)
WBC: 6.7 10*3/uL (ref 3.9–10.3)

## 2013-05-03 MED ORDER — EPOETIN ALFA 40000 UNIT/ML IJ SOLN
40000.0000 [IU] | INTRAMUSCULAR | Status: DC
  Administered 2013-05-03: 40000 [IU] via SUBCUTANEOUS
  Filled 2013-05-03: qty 1

## 2013-05-10 ENCOUNTER — Ambulatory Visit (INDEPENDENT_AMBULATORY_CARE_PROVIDER_SITE_OTHER): Payer: Medicare PPO | Admitting: Podiatry

## 2013-05-10 ENCOUNTER — Encounter: Payer: Self-pay | Admitting: Podiatry

## 2013-05-10 ENCOUNTER — Other Ambulatory Visit: Payer: Self-pay | Admitting: *Deleted

## 2013-05-10 VITALS — BP 124/64 | HR 60 | Resp 16

## 2013-05-10 DIAGNOSIS — D649 Anemia, unspecified: Secondary | ICD-10-CM

## 2013-05-10 DIAGNOSIS — M79609 Pain in unspecified limb: Secondary | ICD-10-CM

## 2013-05-10 DIAGNOSIS — Q828 Other specified congenital malformations of skin: Secondary | ICD-10-CM

## 2013-05-10 DIAGNOSIS — E119 Type 2 diabetes mellitus without complications: Secondary | ICD-10-CM

## 2013-05-10 DIAGNOSIS — B351 Tinea unguium: Secondary | ICD-10-CM

## 2013-05-10 MED ORDER — GLUCOSE BLOOD VI STRP: ORAL_STRIP | Status: DC

## 2013-05-10 NOTE — Progress Notes (Signed)
She presents today with a chief complaint of painful elongated toenails multiple for porokeratotic lesions they're inhibiting her ability to walk normally.  Objective: Vital signs are stable she is alert and oriented x3. Pulses are palpable bilateral. Multiple porokeratotic lesions plantar aspect of the bilateral foot right foot appears to be worse along the lateral aspect. Otherwise her nails are thick yellow dystrophic with mycotic. Xerosis plantar aspect of the bilateral foot.  Assessment: Pain in limb secondary to onychomycosis and diabetes 1 through 5 bilateral. Porokeratosis are intact bilateral.  Plan: Debridement of nails 1 through 5 bilateral debridement of reactive hyperkeratosis bilateral followup with her as needed.

## 2013-05-10 NOTE — Telephone Encounter (Signed)
Pharmacy faxed requesting a new rx to be sent for testing strips that include frequency of testing and diagnosis code. Done.

## 2013-05-12 ENCOUNTER — Other Ambulatory Visit: Payer: Self-pay | Admitting: Nurse Practitioner

## 2013-05-12 DIAGNOSIS — D649 Anemia, unspecified: Secondary | ICD-10-CM

## 2013-05-13 ENCOUNTER — Telehealth: Payer: Self-pay | Admitting: Oncology

## 2013-05-13 NOTE — Telephone Encounter (Signed)
S/w the pt and she is aware of her 05/24/2013 lab and injection appt. Pt is aware when she comes in that day to pick up her June appt calendar.

## 2013-05-17 ENCOUNTER — Telehealth: Payer: Self-pay | Admitting: Endocrinology

## 2013-05-17 NOTE — Telephone Encounter (Signed)
Pt cannot receive one touch it is not covered  She can either have prodigy or accucheck for the strips, meter and lancets.

## 2013-05-18 ENCOUNTER — Other Ambulatory Visit: Payer: Self-pay | Admitting: *Deleted

## 2013-05-18 MED ORDER — GLUCOSE BLOOD VI STRP: ORAL_STRIP | Status: DC

## 2013-05-18 MED ORDER — ACCU-CHEK FASTCLIX LANCETS MISC: Status: AC

## 2013-05-18 MED ORDER — ACCU-CHEK AVIVA PLUS W/DEVICE KIT: PACK | Status: AC

## 2013-05-24 ENCOUNTER — Ambulatory Visit: Payer: Medicare PPO | Admitting: Oncology

## 2013-05-24 ENCOUNTER — Telehealth: Payer: Self-pay | Admitting: Oncology

## 2013-05-24 ENCOUNTER — Other Ambulatory Visit (HOSPITAL_BASED_OUTPATIENT_CLINIC_OR_DEPARTMENT_OTHER): Payer: Medicare PPO

## 2013-05-24 ENCOUNTER — Ambulatory Visit (HOSPITAL_BASED_OUTPATIENT_CLINIC_OR_DEPARTMENT_OTHER): Payer: Medicare PPO

## 2013-05-24 VITALS — BP 133/49 | HR 61 | Temp 98.5°F

## 2013-05-24 DIAGNOSIS — D631 Anemia in chronic kidney disease: Secondary | ICD-10-CM

## 2013-05-24 DIAGNOSIS — N189 Chronic kidney disease, unspecified: Secondary | ICD-10-CM

## 2013-05-24 DIAGNOSIS — N039 Chronic nephritic syndrome with unspecified morphologic changes: Secondary | ICD-10-CM

## 2013-05-24 DIAGNOSIS — D649 Anemia, unspecified: Secondary | ICD-10-CM

## 2013-05-24 LAB — CBC WITH DIFFERENTIAL/PLATELET
BASO%: 0.2 % (ref 0.0–2.0)
Basophils Absolute: 0 10*3/uL (ref 0.0–0.1)
EOS%: 13.6 % — ABNORMAL HIGH (ref 0.0–7.0)
Eosinophils Absolute: 0.8 10*3/uL — ABNORMAL HIGH (ref 0.0–0.5)
HCT: 34.4 % — ABNORMAL LOW (ref 34.8–46.6)
HGB: 10.9 g/dL — ABNORMAL LOW (ref 11.6–15.9)
LYMPH#: 1.7 10*3/uL (ref 0.9–3.3)
LYMPH%: 29.9 % (ref 14.0–49.7)
MCH: 27.6 pg (ref 25.1–34.0)
MCHC: 31.7 g/dL (ref 31.5–36.0)
MCV: 87.1 fL (ref 79.5–101.0)
MONO#: 0.3 10*3/uL (ref 0.1–0.9)
MONO%: 4.6 % (ref 0.0–14.0)
NEUT#: 2.9 10*3/uL (ref 1.5–6.5)
NEUT%: 51.7 % (ref 38.4–76.8)
NRBC: 0 % (ref 0–0)
Platelets: 239 10*3/uL (ref 145–400)
RBC: 3.95 10*6/uL (ref 3.70–5.45)
RDW: 16.3 % — ABNORMAL HIGH (ref 11.2–14.5)
WBC: 5.7 10*3/uL (ref 3.9–10.3)

## 2013-05-24 MED ORDER — EPOETIN ALFA 40000 UNIT/ML IJ SOLN
40000.0000 [IU] | INTRAMUSCULAR | Status: DC
  Administered 2013-05-24: 40000 [IU] via SUBCUTANEOUS
  Filled 2013-05-24: qty 1

## 2013-05-24 NOTE — Telephone Encounter (Signed)
Gave pt apptm for june today

## 2013-05-27 ENCOUNTER — Other Ambulatory Visit: Payer: Medicare PPO

## 2013-05-31 ENCOUNTER — Other Ambulatory Visit (INDEPENDENT_AMBULATORY_CARE_PROVIDER_SITE_OTHER): Payer: Medicare PPO

## 2013-05-31 DIAGNOSIS — IMO0001 Reserved for inherently not codable concepts without codable children: Secondary | ICD-10-CM

## 2013-05-31 DIAGNOSIS — E039 Hypothyroidism, unspecified: Secondary | ICD-10-CM

## 2013-05-31 DIAGNOSIS — E785 Hyperlipidemia, unspecified: Secondary | ICD-10-CM

## 2013-05-31 DIAGNOSIS — E1165 Type 2 diabetes mellitus with hyperglycemia: Principal | ICD-10-CM

## 2013-05-31 LAB — LIPID PANEL
Cholesterol: 203 mg/dL — ABNORMAL HIGH (ref 0–200)
HDL: 30.7 mg/dL — ABNORMAL LOW (ref >39.00)
LDL Cholesterol: 130 mg/dL — ABNORMAL HIGH (ref 0–99)
Total CHOL/HDL Ratio: 7
Triglycerides: 212 mg/dL — ABNORMAL HIGH (ref 0.0–149.0)
VLDL: 42.4 mg/dL — ABNORMAL HIGH (ref 0.0–40.0)

## 2013-05-31 LAB — MICROALBUMIN / CREATININE URINE RATIO
Creatinine,U: 248.3 mg/dL
MICROALB UR: 209.5 mg/dL — AB (ref 0.0–1.9)
MICROALB/CREAT RATIO: 84.4 mg/g — AB (ref 0.0–30.0)

## 2013-05-31 LAB — COMPREHENSIVE METABOLIC PANEL
ALT: 144 U/L — ABNORMAL HIGH (ref 0–35)
AST: 106 U/L — AB (ref 0–37)
Albumin: 2.9 g/dL — ABNORMAL LOW (ref 3.5–5.2)
Alkaline Phosphatase: 331 U/L — ABNORMAL HIGH (ref 39–117)
BILIRUBIN TOTAL: 1.4 mg/dL — AB (ref 0.2–1.2)
BUN: 39 mg/dL — AB (ref 6–23)
CO2: 26 mEq/L (ref 19–32)
Calcium: 9.1 mg/dL (ref 8.4–10.5)
Chloride: 105 mEq/L (ref 96–112)
Creatinine, Ser: 1.9 mg/dL — ABNORMAL HIGH (ref 0.4–1.2)
GFR: 33.84 mL/min — ABNORMAL LOW (ref >60.00)
GLUCOSE: 152 mg/dL — AB (ref 70–99)
POTASSIUM: 4.6 meq/L (ref 3.5–5.1)
SODIUM: 140 meq/L (ref 135–145)
Total Protein: 6.6 g/dL (ref 6.0–8.3)

## 2013-05-31 LAB — HEMOGLOBIN A1C: Hgb A1c MFr Bld: 5.4 % (ref 4.6–6.5)

## 2013-05-31 LAB — TSH: TSH: 1.75 u[IU]/mL (ref 0.35–4.50)

## 2013-05-31 LAB — T4, FREE: Free T4: 1.03 ng/dL (ref 0.60–1.60)

## 2013-06-01 ENCOUNTER — Encounter: Payer: Self-pay | Admitting: Endocrinology

## 2013-06-01 ENCOUNTER — Ambulatory Visit (INDEPENDENT_AMBULATORY_CARE_PROVIDER_SITE_OTHER): Payer: Medicare PPO | Admitting: Endocrinology

## 2013-06-01 VITALS — BP 126/72 | HR 56 | Temp 98.3°F | Resp 16 | Ht 68.0 in | Wt 264.2 lb

## 2013-06-01 DIAGNOSIS — E1165 Type 2 diabetes mellitus with hyperglycemia: Principal | ICD-10-CM

## 2013-06-01 DIAGNOSIS — N183 Chronic kidney disease, stage 3 unspecified: Secondary | ICD-10-CM

## 2013-06-01 DIAGNOSIS — IMO0001 Reserved for inherently not codable concepts without codable children: Secondary | ICD-10-CM

## 2013-06-01 DIAGNOSIS — E785 Hyperlipidemia, unspecified: Secondary | ICD-10-CM

## 2013-06-01 NOTE — Patient Instructions (Signed)
Lantus 40 units  May adjust Novolog based on Carbs and size of meals

## 2013-06-01 NOTE — Progress Notes (Signed)
Patient ID: [name], female   DOB: [birth year], 80 y.o.   MRN: [id number]   Reason for Appointment: Diabetes follow-up    History of Present Illness   Diagnosis: Type 2 DIABETES MELITUS, date of diagnosis:  1997  Past history: She was started on insulin within a year of diagnosis; had diarrhea from metformin and swelling from Actos Generally her blood sugars are fairly well-controlled with basal bolus insulin regimen and A1c mostly below 7 although this will not be representative of her actual blood sugars because of her renal dysfunction and anemia  RECENT history:  Insulin regimen: Lantus 42 units a.m., 16  units PM and NovoLog  04-11-16 a.c.         Since her last visit in 2/15 her blood sugars have been overall fairly good Her Lantus was reduced in the evening to 16 units because of low normal fasting readings She has not had any morning hypoglycemia but this week has had readings in the 50s before or after supper on 2 occasions Again she is checking her blood sugars mostly around 9 AM and 9 PM only Also a couple of weeks ago she was having high readings after evening meal, possibly from taking the glucose right after eating A1c pending She is fairly compliant with doing her insulin as directed before meals and Lantus twice a day  Hypoglycemia :  twice as above    Proper timing of medications in relation to meals: Yes.         Monitors blood glucose: Once a day.    Glucometer:  recently Accu-Chek with the following download:  PREMEAL Breakfast Lunch Dinner Bedtime Overall  Glucose range: 78-145      Mean/median: 107    121    Meals:  2-3 meals per day.  breakfast: oatmeal, fruit. Supper 7 pm   Physical activity:  unable to do much            Dietician visit: Most recent: 2/83          Complications: are: ? Nephropathy, last microalbumin/ creatinine ratio was 87 in 2/14     Wt Readings from Last 3 Encounters:  06/01/13 264 lb 3.2 oz (119.84 kg)  02/28/13 276 lb 3.2 oz  (125.283 kg)  01/18/13 275 lb 14.4 oz (125.147 kg)   Lab Results  Component Value Date   HGBA1C 5.8 02/07/2013   HGBA1C 6.3 09/13/2012   HGBA1C  Value: 6.2 (NOTE)                                                                       According to the ADA Clinical Practice Recommendations for 2011, when HbA1c is used as a screening test:   >=6.5%   Diagnostic of Diabetes Mellitus           (if abnormal result  is confirmed)  5.7-6.4%   Increased risk of developing Diabetes Mellitus  References:Diagnosis and Classification of Diabetes Mellitus,Diabetes TDVV,6160,73(XTGGY 1):S62-S69 and Standards of Medical Care in         Diabetes - 2011,Diabetes Care,2011,34  (Suppl 1):S11-S61.* 02/06/2010   Lab Results  Component Value Date   MICROALBUR 5.8* 09/13/2012   LDLCALC 59 02/07/2013   CREATININE 1.5* 02/07/2013  Medication List       This list is accurate as of: 06/01/13  4:10 PM.  Always use your most recent med list.               ACCU-CHEK AVIVA PLUS W/DEVICE Kit  Use to check blood sugar 3 times per day dx code 250.02     ACCU-CHEK FASTCLIX LANCETS Misc  Use to obtain blood specimen 3 times per day     ADVAIR DISKUS 250-50 MCG/DOSE Aepb  Generic drug:  Fluticasone-Salmeterol  Inhale 1 puff into the lungs every 12 (twelve) hours.     albuterol 108 (90 BASE) MCG/ACT inhaler  Commonly known as:  PROVENTIL HFA;VENTOLIN HFA  Inhale 2 puffs into the lungs every 6 (six) hours as needed.     allopurinol 100 MG tablet  Commonly known as:  ZYLOPRIM  Take 1 tablet (100 mg total) by mouth daily.     aspirin 81 MG tablet  Take 81 mg by mouth daily.     diltiazem 300 MG 24 hr capsule  Commonly known as:  CARDIZEM CD  Take 300 mg by mouth daily.     docusate sodium 100 MG capsule  Commonly known as:  COLACE  Take 100 mg by mouth 2 (two) times daily as needed.     ergocalciferol 50000 UNITS capsule  Commonly known as:  VITAMIN D2  Take 1 capsule (50,000 Units total) by mouth once a  week.     fenofibrate micronized 43 MG capsule  Commonly known as:  ANTARA  Take 1 capsule (43 mg total) by mouth daily before breakfast.     ferrous sulfate 325 (65 FE) MG tablet  Take 325 mg by mouth 2 (two) times daily.     furosemide 80 MG tablet  Commonly known as:  LASIX  Take 80 mg by mouth daily.     glucose blood test strip  Commonly known as:  ACCU-CHEK AVIVA PLUS  Use as instructed to check blood sugar 3 times per day dx code 250.02     insulin aspart 100 UNIT/ML injection  Commonly known as:  novoLOG  4 units before breakfast, 6 units before lunch, 18 units before supper     NOVOLOG 100 UNIT/ML injection  Generic drug:  insulin aspart  INJECT 4 UNITS BEFORE BREAKFAST, 6 UNITS BEFORE LUNCH, 22 UNITS BEFORE SUPPER     insulin glargine 100 UNIT/ML injection  Commonly known as:  LANTUS  Inject 44 Units into the skin 2 (two) times daily. 44 units Qam, 20 units before supper     Insulin Syringe-Needle U-100 31G X 5/16" 1 ML Misc  Commonly known as:  B-D INS SYR ULTRAFINE 1CC/31G  Inject 1 each into the skin 3 (three) times daily.     levothyroxine 25 MCG tablet  Commonly known as:  SYNTHROID, LEVOTHROID  TAKE 1 TABLET (25 MCG TOTAL) BY MOUTH DAILY.     losartan 100 MG tablet  Commonly known as:  COZAAR  Take 1 tablet (100 mg total) by mouth daily.     montelukast 10 MG tablet  Commonly known as:  SINGULAIR  Take 10 mg by mouth at bedtime.     omeprazole 20 MG capsule  Commonly known as:  PRILOSEC  Take 30- 60 min before your first and last meals of the day     polyethylene glycol packet  Commonly known as:  MIRALAX / GLYCOLAX  MIX TO TAKE 17GM AS NEEDED AS DIRECTED  pravastatin 20 MG tablet  Commonly known as:  PRAVACHOL  Take 20 mg by mouth daily.     PROCRIT IJ  Inject 40,000 Units as directed every 30 (thirty) days.        Allergies:  Allergies  Allergen Reactions  . Percocet [Oxycodone-Acetaminophen] Nausea And Vomiting  . Codeine      Felt like "my head was going to explode"  . Zantac [Ranitidine Hcl] Nausea And Vomiting    Past Medical History  Diagnosis Date  . Hypertension   . Heart failure   . Asthma   . Diabetes mellitus, type 2   . Sleep apnea     Past Surgical History  Procedure Laterality Date  . Cholecystectomy  1973  . Total abdominal hysterectomy  1982  . Tubal ligation  1973  . Breast reduction surgery  2002    Family History  Problem Relation Age of Onset  . Asthma Sister   . Asthma Mother   . Heart disease Father   . Cancer Sister     breast cancer  . Cancer Brother     kidney    Social History:  reports that she quit smoking about 49 years ago. Her smoking use included Cigarettes. She has a 2.7 pack-year smoking history. She does not have any smokeless tobacco history on file. She reports that she does not drink alcohol or use illicit drugs.  Review of Systems:  HYPERTENSION:  long-standing and well controlled with current regimen   HYPOTHYROID: TSH has been normal with taking small dose  of 25 mcg levothyroxine. She is asking about taking Armour Thyroid because she continues to be somewhat fatigued  Lab Results  Component Value Date   TSH 3.51 10/27/2012    Chronic kidney disease: Followed by nephrologist periodically   She has not had any steroids recently for her COPD  Continues to get Aranesp for her anemia periodically  Lab Results  Component Value Date   WBC 5.7 05/24/2013   HGB 10.9* 05/24/2013   HCT 34.4* 05/24/2013   MCV 87.1 05/24/2013   PLT 239 05/24/2013     HYPERLIPIDEMIA: The lipid abnormality consists mostly of high triglycerides, now well controlled; still has relatively low HDL. She has been treated  with fenofibrate and pravastatin. Doses of fenofibrate limited by her renal function   Lab Results  Component Value Date   CHOL 118 02/07/2013   HDL 37.40* 02/07/2013   LDLCALC 59 02/07/2013   TRIG 107.0 02/07/2013   CHOLHDL 3 02/07/2013        Examination:    BP 126/72  Pulse 56  Temp(Src) 98.3 F (36.8 C)  Resp 16  Ht _0  (1.727 m)  Wt 264 lb 3.2 oz (119.84 kg)  BMI 40.18 kg/m2  SpO2 96%  Body mass index is 40.18 kg/(m^2).     ASSESSMENT/ PLAN:   Diabetes type 2   The patient's diabetes  is overall fairly good with blood sugars generally at target with only occasional high readings after evening meal More recently she has relatively lower readings, once before and once after supper Fasting readings are fairly consistent  She is also losing some weight  Insulin changes: Reduce Lantus in the morning to 40 units  HYPERTENSION: Well controlled   Hypothyroidism: TSH to be checked today. She has only minimal hypothyroidism  Elayne Snare 06/01/2013, 4:10 PM   Addendum: Liver tests significantly high, needs to followup with PCP  Appointment on 05/31/2013  Component Date Value Ref Range  Status  . Hemoglobin A1C 05/31/2013 5.4  4.6 - 6.5 % Final   Glycemic Control Guidelines for People with Diabetes:Non Diabetic:  <6%Goal of Therapy: <7%Additional Action Suggested:  >8%   . Sodium 05/31/2013 140  135 - 145 mEq/L Final  . Potassium 05/31/2013 4.6  3.5 - 5.1 mEq/L Final  . Chloride 05/31/2013 105  96 - 112 mEq/L Final  . CO2 05/31/2013 26  19 - 32 mEq/L Final  . Glucose, Bld 05/31/2013 152* 70 - 99 mg/dL Final  . BUN 05/31/2013 39* 6 - 23 mg/dL Final  . Creatinine, Ser 05/31/2013 1.9* 0.4 - 1.2 mg/dL Final  . Total Bilirubin 05/31/2013 1.4* 0.2 - 1.2 mg/dL Final  . Alkaline Phosphatase 05/31/2013 331* 39 - 117 U/L Final  . AST 05/31/2013 106* 0 - 37 U/L Final  . ALT 05/31/2013 144* 0 - 35 U/L Final  . Total Protein 05/31/2013 6.6  6.0 - 8.3 g/dL Final  . Albumin 05/31/2013 2.9* 3.5 - 5.2 g/dL Final  . Calcium 05/31/2013 9.1  8.4 - 10.5 mg/dL Final  . GFR 05/31/2013 33.84* >60.00 mL/min Final  . Cholesterol 05/31/2013 203* 0 - 200 mg/dL Final   ATP III Classification       Desirable:  < 200 mg/dL               Borderline High:   200 - 239 mg/dL          High:  > = 240 mg/dL  . Triglycerides 05/31/2013 212.0* 0.0 - 149.0 mg/dL Final   Normal:  <150 mg/dLBorderline High:  150 - 199 mg/dL  . HDL 05/31/2013 30.70* >39.00 mg/dL Final  . VLDL 05/31/2013 42.4* 0.0 - 40.0 mg/dL Final  . LDL Cholesterol 05/31/2013 130* 0 - 99 mg/dL Final  . Total CHOL/HDL Ratio 05/31/2013 7   Final                  Men          Women1/2 Average Risk     3.4          3.3Average Risk          5.0          4.42X Average Risk          9.6          7.13X Average Risk          15.0          11.0                      . Microalb, Ur 05/31/2013 209.5* 0.0 - 1.9 mg/dL Final  . Creatinine,U 05/31/2013 248.3   Final  . Microalb Creat Ratio 05/31/2013 84.4* 0.0 - 30.0 mg/g Final  . TSH 05/31/2013 1.75  0.35 - 4.50 uIU/mL Final  . Free T4 05/31/2013 1.03  0.60 - 1.60 ng/dL Final

## 2013-06-07 ENCOUNTER — Other Ambulatory Visit: Payer: Self-pay | Admitting: *Deleted

## 2013-06-07 MED ORDER — INSULIN GLARGINE 100 UNIT/ML ~~LOC~~ SOLN: SUBCUTANEOUS | Status: DC

## 2013-06-09 ENCOUNTER — Other Ambulatory Visit: Payer: Self-pay | Admitting: *Deleted

## 2013-06-09 MED ORDER — FENOFIBRATE MICRONIZED 43 MG PO CAPS: 43.0000 mg | ORAL_CAPSULE | Freq: Every day | ORAL | Status: DC

## 2013-06-13 ENCOUNTER — Other Ambulatory Visit: Payer: Self-pay | Admitting: *Deleted

## 2013-06-13 DIAGNOSIS — D649 Anemia, unspecified: Secondary | ICD-10-CM

## 2013-06-14 ENCOUNTER — Telehealth: Payer: Self-pay | Admitting: Oncology

## 2013-06-14 ENCOUNTER — Other Ambulatory Visit: Payer: Self-pay | Admitting: Family Medicine

## 2013-06-14 ENCOUNTER — Ambulatory Visit: Payer: Medicare PPO

## 2013-06-14 ENCOUNTER — Other Ambulatory Visit (HOSPITAL_BASED_OUTPATIENT_CLINIC_OR_DEPARTMENT_OTHER): Payer: Medicare PPO

## 2013-06-14 ENCOUNTER — Ambulatory Visit (HOSPITAL_BASED_OUTPATIENT_CLINIC_OR_DEPARTMENT_OTHER): Payer: Medicare PPO | Admitting: Oncology

## 2013-06-14 VITALS — BP 158/84 | HR 54 | Temp 98.4°F | Resp 18 | Ht 68.0 in | Wt 257.3 lb

## 2013-06-14 DIAGNOSIS — I498 Other specified cardiac arrhythmias: Secondary | ICD-10-CM

## 2013-06-14 DIAGNOSIS — D649 Anemia, unspecified: Secondary | ICD-10-CM

## 2013-06-14 DIAGNOSIS — I509 Heart failure, unspecified: Secondary | ICD-10-CM

## 2013-06-14 DIAGNOSIS — R945 Abnormal results of liver function studies: Secondary | ICD-10-CM

## 2013-06-14 DIAGNOSIS — E119 Type 2 diabetes mellitus without complications: Secondary | ICD-10-CM

## 2013-06-14 DIAGNOSIS — R5383 Other fatigue: Secondary | ICD-10-CM

## 2013-06-14 DIAGNOSIS — R7989 Other specified abnormal findings of blood chemistry: Secondary | ICD-10-CM

## 2013-06-14 DIAGNOSIS — I10 Essential (primary) hypertension: Secondary | ICD-10-CM

## 2013-06-14 DIAGNOSIS — R5381 Other malaise: Secondary | ICD-10-CM

## 2013-06-14 DIAGNOSIS — N289 Disorder of kidney and ureter, unspecified: Secondary | ICD-10-CM

## 2013-06-14 DIAGNOSIS — L989 Disorder of the skin and subcutaneous tissue, unspecified: Secondary | ICD-10-CM

## 2013-06-14 LAB — CBC WITH DIFFERENTIAL/PLATELET
BASO%: 0.6 % (ref 0.0–2.0)
Basophils Absolute: 0 10*3/uL (ref 0.0–0.1)
EOS%: 9.2 % — ABNORMAL HIGH (ref 0.0–7.0)
Eosinophils Absolute: 0.5 10*3/uL (ref 0.0–0.5)
HCT: 35.9 % (ref 34.8–46.6)
HGB: 11.5 g/dL — ABNORMAL LOW (ref 11.6–15.9)
LYMPH%: 26.3 % (ref 14.0–49.7)
MCH: 27.8 pg (ref 25.1–34.0)
MCHC: 32 g/dL (ref 31.5–36.0)
MCV: 86.9 fL (ref 79.5–101.0)
MONO#: 0.5 10*3/uL (ref 0.1–0.9)
MONO%: 8.8 % (ref 0.0–14.0)
NEUT#: 2.8 10*3/uL (ref 1.5–6.5)
NEUT%: 55.1 % (ref 38.4–76.8)
NRBC: 0 % (ref 0–0)
Platelets: 201 10*3/uL (ref 145–400)
RBC: 4.13 10*6/uL (ref 3.70–5.45)
RDW: 16.3 % — AB (ref 11.2–14.5)
WBC: 5.1 10*3/uL (ref 3.9–10.3)
lymph#: 1.3 10*3/uL (ref 0.9–3.3)

## 2013-06-14 NOTE — Progress Notes (Signed)
  [place] OFFICE PROGRESS NOTE   Diagnosis: Anemia  INTERVAL HISTORY:   She returns as scheduled. She continues every 3 week Procrit. She is taking iron. She reports Dr. Sabra Heck is evaluating her for elevated liver enzymes. She has developed a nodular skin lesion inferior to the right breast. No other complaint.  Objective:  Vital signs in last 24 hours:  Blood pressure 158/84, pulse 49, temperature 98.4 F (36.9 C), temperature source Oral, resp. rate 18, height 5\' 8"  (1.727 m), weight 257 lb 4.8 oz (116.711 kg), SpO2 97.00%. repeat manual pulse 54   Resp: Coarse rhonchi at the left posterior base, no respiratory distress Cardio: Irregular GI: Nontender, no hepatosplenomegaly Vascular: Chronic stasis change at the low leg bilaterally with trace edema at the left greater than right  Skin: Over the inferior aspect of the left breast at the chest wall there is a 1/2 cm soft nodular mobile cutaneous lesion consistent with an inflamed cyst    Lab Results:  Lab Results  Component Value Date   WBC 5.1 06/14/2013   HGB 11.5* 06/14/2013   HCT 35.9 06/14/2013   MCV 86.9 06/14/2013   PLT 201 06/14/2013   NEUTROABS 2.8 06/14/2013    Medications: I have reviewed the patient's current medications.  Assessment/Plan: 1. Chronic anemia secondary to renal insufficiency, stable. The goal is to maintain the hemoglobin between 10 and 11.  2. Renal insufficiency, followed by Dr. Lorrene Reid. 3. Hypothyroidism. 4. Chronic fatigue. 5. History of a duodenal polyp, status post a biopsy 06/12/2005 with the pathology confirming chronic duodenitis. 6. Diabetes. 7. Hypertension. 8. Asthma.  9. Congestive heart failure.  10. Bradycardia-chronic, followed by cardiology 11. Elevated liver enzymes, she reports she is undergoing an evaluation by Dr. Sabra Heck   Disposition:  [name] is stable from a hematologic standpoint. Procrit will be held today. The plan is to continue Procrit on a 3 week  schedule with a goal hemoglobin of between 10 and 11. She will return for an office visit in 4 months. I suspect the right breast skin lesion is a benign inclusion cyst. She continues iron. We will check serum iron studies when she returns in 3 weeks.  Ladell Pier, MD  06/14/2013  12:44 PM

## 2013-06-14 NOTE — Telephone Encounter (Signed)
gv adn rpinted appts cehd and avs for pt for June thru Sept

## 2013-06-21 ENCOUNTER — Ambulatory Visit
Admission: RE | Admit: 2013-06-21 | Discharge: 2013-06-21 | Disposition: A | Discharge status: Home / Self Care | Payer: Medicare PPO | Source: Ambulatory Visit | Attending: Family Medicine | Admitting: Family Medicine

## 2013-06-21 DIAGNOSIS — R945 Abnormal results of liver function studies: Secondary | ICD-10-CM

## 2013-06-21 DIAGNOSIS — R7989 Other specified abnormal findings of blood chemistry: Secondary | ICD-10-CM

## 2013-06-23 ENCOUNTER — Other Ambulatory Visit: Payer: Self-pay | Admitting: Family Medicine

## 2013-06-23 DIAGNOSIS — L299 Pruritus, unspecified: Secondary | ICD-10-CM

## 2013-06-23 DIAGNOSIS — R945 Abnormal results of liver function studies: Secondary | ICD-10-CM

## 2013-06-23 DIAGNOSIS — R935 Abnormal findings on diagnostic imaging of other abdominal regions, including retroperitoneum: Secondary | ICD-10-CM

## 2013-06-23 DIAGNOSIS — R7989 Other specified abnormal findings of blood chemistry: Secondary | ICD-10-CM

## 2013-06-26 ENCOUNTER — Ambulatory Visit
Admission: RE | Admit: 2013-06-26 | Discharge: 2013-06-26 | Disposition: A | Discharge status: Home / Self Care | Payer: Medicare PPO | Source: Ambulatory Visit | Attending: Family Medicine | Admitting: Family Medicine

## 2013-06-26 ENCOUNTER — Other Ambulatory Visit: Payer: Self-pay | Admitting: Family Medicine

## 2013-06-26 DIAGNOSIS — R935 Abnormal findings on diagnostic imaging of other abdominal regions, including retroperitoneum: Secondary | ICD-10-CM

## 2013-06-26 DIAGNOSIS — R7989 Other specified abnormal findings of blood chemistry: Secondary | ICD-10-CM

## 2013-06-26 DIAGNOSIS — R945 Abnormal results of liver function studies: Secondary | ICD-10-CM

## 2013-06-26 DIAGNOSIS — L299 Pruritus, unspecified: Secondary | ICD-10-CM

## 2013-06-27 ENCOUNTER — Other Ambulatory Visit: Payer: Self-pay | Admitting: Gastroenterology

## 2013-06-27 NOTE — Addendum Note (Signed)
Addended by: Arta Silence on: 06/27/2013 04:06 PM   Modules accepted: Orders

## 2013-06-28 ENCOUNTER — Encounter (HOSPITAL_COMMUNITY): Payer: Self-pay | Admitting: Pharmacy Technician

## 2013-06-28 ENCOUNTER — Encounter (HOSPITAL_COMMUNITY): Payer: Self-pay | Admitting: *Deleted

## 2013-06-29 ENCOUNTER — Ambulatory Visit (HOSPITAL_COMMUNITY): Payer: Medicare PPO

## 2013-06-29 ENCOUNTER — Ambulatory Visit (HOSPITAL_COMMUNITY)
Admission: RE | Admit: 2013-06-29 | Discharge: 2013-06-29 | Disposition: A | Discharge status: Home / Self Care | Payer: Medicare PPO | Source: Ambulatory Visit | Attending: Gastroenterology | Admitting: Gastroenterology

## 2013-06-29 ENCOUNTER — Encounter (HOSPITAL_COMMUNITY)
Admission: RE | Disposition: A | Discharge status: Home / Self Care | Payer: Self-pay | Source: Ambulatory Visit | Attending: Gastroenterology

## 2013-06-29 ENCOUNTER — Encounter (HOSPITAL_COMMUNITY): Payer: Self-pay | Admitting: Anesthesiology

## 2013-06-29 ENCOUNTER — Encounter (HOSPITAL_COMMUNITY): Payer: Medicare PPO | Admitting: Anesthesiology

## 2013-06-29 ENCOUNTER — Ambulatory Visit (HOSPITAL_COMMUNITY): Payer: Medicare PPO | Admitting: Anesthesiology

## 2013-06-29 DIAGNOSIS — K838 Other specified diseases of biliary tract: Secondary | ICD-10-CM | POA: Insufficient documentation

## 2013-06-29 DIAGNOSIS — Z87891 Personal history of nicotine dependence: Secondary | ICD-10-CM | POA: Insufficient documentation

## 2013-06-29 DIAGNOSIS — E109 Type 1 diabetes mellitus without complications: Secondary | ICD-10-CM | POA: Insufficient documentation

## 2013-06-29 DIAGNOSIS — K869 Disease of pancreas, unspecified: Secondary | ICD-10-CM | POA: Diagnosis not present

## 2013-06-29 DIAGNOSIS — R5383 Other fatigue: Secondary | ICD-10-CM

## 2013-06-29 DIAGNOSIS — D649 Anemia, unspecified: Secondary | ICD-10-CM | POA: Insufficient documentation

## 2013-06-29 DIAGNOSIS — Z79899 Other long term (current) drug therapy: Secondary | ICD-10-CM | POA: Insufficient documentation

## 2013-06-29 DIAGNOSIS — R748 Abnormal levels of other serum enzymes: Secondary | ICD-10-CM | POA: Insufficient documentation

## 2013-06-29 DIAGNOSIS — N289 Disorder of kidney and ureter, unspecified: Secondary | ICD-10-CM | POA: Insufficient documentation

## 2013-06-29 DIAGNOSIS — J4489 Other specified chronic obstructive pulmonary disease: Secondary | ICD-10-CM | POA: Insufficient documentation

## 2013-06-29 DIAGNOSIS — C250 Malignant neoplasm of head of pancreas: Secondary | ICD-10-CM | POA: Diagnosis not present

## 2013-06-29 DIAGNOSIS — G4730 Sleep apnea, unspecified: Secondary | ICD-10-CM | POA: Insufficient documentation

## 2013-06-29 DIAGNOSIS — J449 Chronic obstructive pulmonary disease, unspecified: Secondary | ICD-10-CM | POA: Insufficient documentation

## 2013-06-29 DIAGNOSIS — I739 Peripheral vascular disease, unspecified: Secondary | ICD-10-CM | POA: Insufficient documentation

## 2013-06-29 DIAGNOSIS — I498 Other specified cardiac arrhythmias: Secondary | ICD-10-CM | POA: Insufficient documentation

## 2013-06-29 DIAGNOSIS — K219 Gastro-esophageal reflux disease without esophagitis: Secondary | ICD-10-CM | POA: Insufficient documentation

## 2013-06-29 DIAGNOSIS — I10 Essential (primary) hypertension: Secondary | ICD-10-CM | POA: Insufficient documentation

## 2013-06-29 DIAGNOSIS — I509 Heart failure, unspecified: Secondary | ICD-10-CM | POA: Insufficient documentation

## 2013-06-29 DIAGNOSIS — R5381 Other malaise: Secondary | ICD-10-CM | POA: Insufficient documentation

## 2013-06-29 DIAGNOSIS — K831 Obstruction of bile duct: Secondary | ICD-10-CM | POA: Insufficient documentation

## 2013-06-29 DIAGNOSIS — L989 Disorder of the skin and subcutaneous tissue, unspecified: Secondary | ICD-10-CM | POA: Insufficient documentation

## 2013-06-29 DIAGNOSIS — E039 Hypothyroidism, unspecified: Secondary | ICD-10-CM | POA: Insufficient documentation

## 2013-06-29 DIAGNOSIS — Z9089 Acquired absence of other organs: Secondary | ICD-10-CM | POA: Insufficient documentation

## 2013-06-29 DIAGNOSIS — Z794 Long term (current) use of insulin: Secondary | ICD-10-CM | POA: Insufficient documentation

## 2013-06-29 HISTORY — PX: EUS: SHX5427

## 2013-06-29 HISTORY — DX: Chronic kidney disease, unspecified: N18.9

## 2013-06-29 HISTORY — DX: Anemia, unspecified: D64.9

## 2013-06-29 HISTORY — PX: ERCP: SHX5425

## 2013-06-29 LAB — GLUCOSE, CAPILLARY: Glucose-Capillary: 89 mg/dL (ref 70–99)

## 2013-06-29 SURGERY — ESOPHAGEAL ENDOSCOPIC ULTRASOUND (EUS) RADIAL
Anesthesia: General

## 2013-06-29 MED ORDER — LACTATED RINGERS IV SOLN
INTRAVENOUS | Status: DC
  Administered 2013-06-29: 14:00:00 via INTRAVENOUS
  Administered 2013-06-29: 1000 mL via INTRAVENOUS

## 2013-06-29 MED ORDER — HYDROCORTISONE NA SUCCINATE PF 100 MG IJ SOLR
INTRAMUSCULAR | Status: DC | PRN
  Administered 2013-06-29: 100 mg via INTRAVENOUS

## 2013-06-29 MED ORDER — FENTANYL CITRATE 0.05 MG/ML IJ SOLN
INTRAMUSCULAR | Status: AC
  Filled 2013-06-29: qty 5

## 2013-06-29 MED ORDER — GLUCAGON HCL RDNA (DIAGNOSTIC) 1 MG IJ SOLR
INTRAMUSCULAR | Status: AC
  Filled 2013-06-29: qty 2

## 2013-06-29 MED ORDER — SUCCINYLCHOLINE CHLORIDE 20 MG/ML IJ SOLN
INTRAMUSCULAR | Status: DC | PRN
  Administered 2013-06-29: 140 mg via INTRAVENOUS

## 2013-06-29 MED ORDER — FENTANYL CITRATE 0.05 MG/ML IJ SOLN
INTRAMUSCULAR | Status: DC | PRN
  Administered 2013-06-29: 50 ug via INTRAVENOUS
  Administered 2013-06-29: 100 ug via INTRAVENOUS
  Administered 2013-06-29 (×2): 50 ug via INTRAVENOUS

## 2013-06-29 MED ORDER — ONDANSETRON HCL 4 MG/2ML IJ SOLN
INTRAMUSCULAR | Status: DC | PRN
  Administered 2013-06-29: 4 mg via INTRAVENOUS

## 2013-06-29 MED ORDER — CIPROFLOXACIN IN D5W 400 MG/200ML IV SOLN
INTRAVENOUS | Status: AC
  Filled 2013-06-29: qty 200

## 2013-06-29 MED ORDER — FENTANYL CITRATE 0.05 MG/ML IJ SOLN: 25.0000 ug | INTRAMUSCULAR | Status: DC | PRN

## 2013-06-29 MED ORDER — PROPOFOL 10 MG/ML IV BOLUS
INTRAVENOUS | Status: AC
  Filled 2013-06-29: qty 20

## 2013-06-29 MED ORDER — CIPROFLOXACIN IN D5W 400 MG/200ML IV SOLN
400.0000 mg | Freq: Once | INTRAVENOUS | Status: AC
  Administered 2013-06-29: 400 mg via INTRAVENOUS

## 2013-06-29 MED ORDER — LIDOCAINE HCL (PF) 2 % IJ SOLN
INTRAMUSCULAR | Status: DC | PRN
  Administered 2013-06-29: 75 mg via INTRADERMAL

## 2013-06-29 MED ORDER — ONDANSETRON HCL 4 MG/2ML IJ SOLN
INTRAMUSCULAR | Status: AC
  Filled 2013-06-29: qty 2

## 2013-06-29 MED ORDER — HYDROCORTISONE NA SUCCINATE PF 100 MG IJ SOLR
INTRAMUSCULAR | Status: AC
  Filled 2013-06-29: qty 2

## 2013-06-29 MED ORDER — PROPOFOL 10 MG/ML IV BOLUS
INTRAVENOUS | Status: DC | PRN
  Administered 2013-06-29: 150 mg via INTRAVENOUS
  Administered 2013-06-29: 50 mg via INTRAVENOUS

## 2013-06-29 MED ORDER — SODIUM CHLORIDE 0.9 % IV SOLN: INTRAVENOUS | Status: DC

## 2013-06-29 MED ORDER — GLUCAGON HCL RDNA (DIAGNOSTIC) 1 MG IJ SOLR
INTRAMUSCULAR | Status: DC | PRN
  Administered 2013-06-29: 1 mg via INTRAVENOUS
  Administered 2013-06-29: .5 mg via INTRAVENOUS
  Administered 2013-06-29: 1 mg via INTRAVENOUS
  Administered 2013-06-29: .5 mg via INTRAVENOUS

## 2013-06-29 MED ORDER — SODIUM CHLORIDE 0.9 % IV SOLN
INTRAVENOUS | Status: DC | PRN
  Administered 2013-06-29: 15:00:00

## 2013-06-29 MED ORDER — LIDOCAINE HCL (CARDIAC) 20 MG/ML IV SOLN
INTRAVENOUS | Status: AC
  Filled 2013-06-29: qty 5

## 2013-06-29 NOTE — Anesthesia Postprocedure Evaluation (Signed)
  Anesthesia Post-op Note  Patient: [name]  Procedure(s) Performed: Procedure(s) (LRB): ESOPHAGEAL ENDOSCOPIC ULTRASOUND (EUS) RADIAL (N/A) ENDOSCOPIC RETROGRADE CHOLANGIOPANCREATOGRAPHY (ERCP) (N/A)  Patient Location: PACU  Anesthesia Type: General  Level of Consciousness: awake and alert   Airway and Oxygen Therapy: Patient Spontanous Breathing  Post-op Pain: mild  Post-op Assessment: Post-op Vital signs reviewed, Patient's Cardiovascular Status Stable, Respiratory Function Stable, Patent Airway and No signs of Nausea or vomiting  Last Vitals:  Filed Vitals:   06/29/13 1520  BP: 194/55  Pulse: 61  Temp:   Resp: 17    Post-op Vital Signs: stable   Complications: No apparent anesthesia complications

## 2013-06-29 NOTE — Interval H&P Note (Signed)
History and Physical Interval Note:  06/29/2013 12:06 PM  [name]  has presented today for surgery, with the diagnosis of obstructive jaundice  The various methods of treatment have been discussed with the patient and family. After consideration of risks, benefits and other options for treatment, the patient has consented to  Procedure(s): ESOPHAGEAL ENDOSCOPIC ULTRASOUND (EUS) RADIAL (N/A) ENDOSCOPIC RETROGRADE CHOLANGIOPANCREATOGRAPHY (ERCP) (N/A) as a surgical intervention .  The patient's history has been reviewed, patient examined, no change in status, stable for surgery.  I have reviewed the patient's chart and labs.  Questions were answered to the patient's satisfaction.     Omair Dettmer M  Assessment:  1.  Obstructive jaundice.  MRI suggestive of soft tissue mass in head of pancreas, views suboptimal given patient's inability to have intravenous contrast administered.  Plan:  1.  Endoscopic ultrasound with likely fine needle aspiration (FNA) biopsies. 2.  Risks (bleeding, infection, bowel perforation that could require surgery, sedation-related changes in cardiopulmonary systems), benefits (identification and possible treatment of source of symptoms, exclusion of certain causes of symptoms), and alternatives (watchful waiting, radiographic imaging studies, empiric medical treatment) of upper endoscopy with ultrasound and biopsies (EUS +/- FNA) were explained to patient/family in detail and patient wishes to proceed. 3.  Endoscopic retrograde cholangiopancreatography with anticipated biliary wallstent placement. 4.  Risks (up to and including bleeding, infection, perforation, pancreatitis that can be complicated by infected necrosis and death), benefits (removal of stones, alleviating blockage, decreasing risk of cholangitis or choledocholithiasis-related pancreatitis), and alternatives (watchful waiting, percutaneous transhepatic cholangiography) of ERCP were explained to  patient/family in detail and patient elects to proceed.

## 2013-06-29 NOTE — Anesthesia Preprocedure Evaluation (Addendum)
Anesthesia Evaluation  Patient identified by MRN, date of birth, ID band Patient awake    Reviewed: Allergy & Precautions, H&P , NPO status , Patient's Chart, lab work & pertinent test results  Airway Mallampati: II TM Distance: >3 FB Neck ROM: Full    Dental no notable dental hx.    Pulmonary asthma , sleep apnea , COPD COPD inhaler, former smoker,  breath sounds clear to auscultation  Pulmonary exam normal       Cardiovascular hypertension, + Peripheral Vascular Disease negative cardio ROS  Rhythm:Regular Rate:Normal  Cardiology visit at [facility] (Dr. Ronnald Ramp) was reviewed. ECHO 06-16-13 reviewed. EF > 55%, Mild LVH, relaxation abnormality.  ECG: SB RBBB,   Neuro/Psych negative neurological ROS  negative psych ROS   GI/Hepatic GERD-  Medicated,Obstructive jaundice   Endo/Other  diabetes, Type 1, Insulin DependentHypothyroidism Morbid obesity  Renal/GU Renal InsufficiencyRenal disease  negative genitourinary   Musculoskeletal negative musculoskeletal ROS (+)   Abdominal (+) + obese,   Peds negative pediatric ROS (+)  Hematology negative hematology ROS (+) anemia ,   Anesthesia Other Findings   Reproductive/Obstetrics negative OB ROS                          Anesthesia Physical Anesthesia Plan  ASA: III  Anesthesia Plan: General   Post-op Pain Management:    Induction: Intravenous  Airway Management Planned: Oral ETT  Additional Equipment:   Intra-op Plan:   Post-operative Plan: Extubation in OR  Informed Consent: I have reviewed the patients History and Physical, chart, labs and discussed the procedure including the risks, benefits and alternatives for the proposed anesthesia with the patient or authorized representative who has indicated his/her understanding and acceptance.   Dental advisory given  Plan Discussed with: CRNA  Anesthesia Plan Comments:         Anesthesia  Quick Evaluation

## 2013-06-29 NOTE — H&P (View-Only) (Signed)
  [place] OFFICE PROGRESS NOTE   Diagnosis: Anemia  INTERVAL HISTORY:   She returns as scheduled. She continues every 3 week Procrit. She is taking iron. She reports Dr. Sabra Heck is evaluating her for elevated liver enzymes. She has developed a nodular skin lesion inferior to the right breast. No other complaint.  Objective:  Vital signs in last 24 hours:  Blood pressure 158/84, pulse 49, temperature 98.4 F (36.9 C), temperature source Oral, resp. rate 18, height 5\' 8"  (1.727 m), weight 257 lb 4.8 oz (116.711 kg), SpO2 97.00%. repeat manual pulse 54   Resp: Coarse rhonchi at the left posterior base, no respiratory distress Cardio: Irregular GI: Nontender, no hepatosplenomegaly Vascular: Chronic stasis change at the low leg bilaterally with trace edema at the left greater than right  Skin: Over the inferior aspect of the left breast at the chest wall there is a 1/2 cm soft nodular mobile cutaneous lesion consistent with an inflamed cyst    Lab Results:  Lab Results  Component Value Date   WBC 5.1 06/14/2013   HGB 11.5* 06/14/2013   HCT 35.9 06/14/2013   MCV 86.9 06/14/2013   PLT 201 06/14/2013   NEUTROABS 2.8 06/14/2013    Medications: I have reviewed the patient's current medications.  Assessment/Plan: 1. Chronic anemia secondary to renal insufficiency, stable. The goal is to maintain the hemoglobin between 10 and 11.  2. Renal insufficiency, followed by Dr. Lorrene Reid. 3. Hypothyroidism. 4. Chronic fatigue. 5. History of a duodenal polyp, status post a biopsy 06/12/2005 with the pathology confirming chronic duodenitis. 6. Diabetes. 7. Hypertension. 8. Asthma.  9. Congestive heart failure.  10. Bradycardia-chronic, followed by cardiology 11. Elevated liver enzymes, she reports she is undergoing an evaluation by Dr. Sabra Heck   Disposition:  Ms. Blatchford is stable from a hematologic standpoint. Procrit will be held today. The plan is to continue Procrit on a 3 week  schedule with a goal hemoglobin of between 10 and 11. She will return for an office visit in 4 months. I suspect the right breast skin lesion is a benign inclusion cyst. She continues iron. We will check serum iron studies when she returns in 3 weeks.  Ladell Pier, MD  06/14/2013  12:44 PM

## 2013-06-29 NOTE — Discharge Instructions (Signed)
Endoscopic Retrograde Cholangiopancreatography (ERCP), Care After °Refer to this sheet in the next few weeks. These instructions provide you with information on caring for yourself after your procedure. Your health care provider may also give you more specific instructions. Your treatment has been planned according to current medical practices, but problems sometimes occur. Call your health care provider if you have any problems or questions after your procedure.  °WHAT TO EXPECT AFTER THE PROCEDURE  °After your procedure, it is typical to feel:  °· Soreness in your throat.   °· Sick to your stomach (nauseous).   °· Bloated. °· Dizzy.   °· Fatigued. °HOME CARE INSTRUCTIONS °· Have a friend or family member stay with you for the first 24 hours after your procedure. °· Start taking your usual medicines and eating normally as soon as you feel well enough to do so or as directed by your health care provider. °SEEK MEDICAL CARE IF: °· You have abdominal pain.   °· You develop signs of infection, such as:   °· Chills.   °· Feeling unwell.   °SEEK IMMEDIATE MEDICAL CARE IF: °· You have difficulty swallowing. °· You have worsening throat, chest, or abdominal pain. °· You vomit. °· You have bloody or very black stools. °· You have a fever. °Document Released: 10/13/2012 Document Reviewed: 10/13/2012 °ExitCare® Patient Information ©2015 [employer]. This information is not intended to replace advice given to you by your health care provider. Make sure you discuss any questions you have with your health care provider. ° ° ° °General Anesthesia, Care After °Refer to this sheet in the next few weeks. These instructions provide you with information on caring for yourself after your procedure. Your health care provider may also give you more specific instructions. Your treatment has been planned according to current medical practices, but problems sometimes occur. Call your health care provider if you have any problems or  questions after your procedure. °WHAT TO EXPECT AFTER THE PROCEDURE °After the procedure, it is typical to experience: °· Sleepiness. °· Nausea and vomiting. °HOME CARE INSTRUCTIONS °· For the first 24 hours after general anesthesia: °¨ Have a responsible person with you. °¨ Do not drive a car. If you are alone, do not take public transportation. °¨ Do not drink alcohol. °¨ Do not take medicine that has not been prescribed by your health care provider. °¨ Do not sign important papers or make important decisions. °¨ You may resume a normal diet and activities as directed by your health care provider. °· Change bandages (dressings) as directed. °· If you have questions or problems that seem related to general anesthesia, call the hospital and ask for the anesthetist or anesthesiologist on call. °SEEK MEDICAL CARE IF: °· You have nausea and vomiting that continue the day after anesthesia. °· You develop a rash. °SEEK IMMEDIATE MEDICAL CARE IF:  °· You have difficulty breathing. °· You have chest pain. °· You have any allergic problems. °Document Released: 03/31/2000 Document Revised: 12/28/2012 Document Reviewed: 07/08/2012 °ExitCare® Patient Information ©2015 [employer]. This information is not intended to replace advice given to you by your health care provider. Make sure you discuss any questions you have with your health care provider. ° °

## 2013-06-29 NOTE — Op Note (Signed)
[facility] [place], [id number]   ERCP PROCEDURE REPORT  PATIENT: [name], [name].  MR# :[id number] BIRTHDATE: February 08, 1932  GENDER: Female ENDOSCOPIST: Arta Silence, MD REFERRED BY: Kathyrn Lass, M.D. PROCEDURE DATE:  06/29/2013 PROCEDURE:   ERCP with sphincterotomy/papillotomy, ERCP with balloon dilation , and ERCP with stent placement ASA CLASS:    ASA-III INDICATIONS:  Obstructive jaundice, pancreatic head mass MEDICATIONS:    General endotracheal anesthesia; Cipro 400 mg IV, hydrocortisone 100 mg IV, glucagon 3 mg IV  DESCRIPTION OF PROCEDURE:   After the risks benefits and alternatives of the procedure were thoroughly explained, informed consent was obtained.  The duodenoscope [id number]  endoscope was introduced through the mouth and advanced to the second portion of the duodenum .     FINDINGS:  Patulous, bulbous and poorly anchored ampulla, with poor bile flow pre-instrumentation, but otherwise normal-appearing. Deep biliary cannulation achieved.  Distal 1cm of bile duct normal, followed by 3-4 cm long tight mid-CBD stricture with upstream intra- and extrahepatic biliary ductal dilatation. Post-cholecystectomy.  Modest biliary sphincterotomy performed with blended current, and then the stricture was dilated with biliary dilating balloon.  39mm x 84mm fully covered biliary wallstent was placed across the stricture, with appropriate positioning confirmed endoscopically and fluoroscopically.   There was abundant dark golden bile flow through the stent post placement.  Pancreatogram was not obtained, intentionally.  ENDOSCOPIC IMPRESSION:  Distal CBD stricture, in setting of pancreatic head mass, stented as described above.  RECOMMENDATIONS:  1.  Watch for potential complications of procedure. 2.  Follow clinical and biochemical response to stent placement. 3.  Patient will need follow-up with Dr. Benay Spice (whom she's seen in the past  for anemia) for oncologic options for her pancreatic adenocarcinoma; based on the portal vasculature involvement and the patient's comorbidities, patient does not seem optimal surgical candidate at the present time.     _______________________________ Lorrin MaisArta Silence, MD 06/29/2013 2:54 PM   CC:

## 2013-06-29 NOTE — Op Note (Signed)
[facility] [place], [address]   ENDOSCOPIC ULTRASOUND PROCEDURE REPORT  PATIENT: [name], [name]  MR#: [id number] BIRTHDATE: 05-Nov-1932  GENDER: Female ENDOSCOPIST: Arta Silence, MD REFERRED BY:  Kathyrn Lass, M.D. PROCEDURE DATE:  06/29/2013 PROCEDURE:   Upper EUS w/FNA ASA CLASS:      Class III INDICATIONS:   1.  obstructive jaundice. MEDICATIONS: General endotracheal anesthesia (GETA)  DESCRIPTION OF PROCEDURE:   After the risks benefits and alternatives of the procedure were  explained, informed consent was obtained. The patient was then placed in the left, lateral, decubitus postion and IV sedation was administered. Throughout the procedure, the patients blood pressure, pulse and oxygen saturations were monitored continuously.  Under direct visualization, the Pentax EUS Linear [id number]  endoscope was introduced through the mouth  and advanced to the second portion of the duodenum .  Water was used as necessary to provide an acoustic interface.  Upon completion of the imaging, water was removed and the patient was sent to the recovery room in satisfactory condition.    FINDINGS:      Significant intra- and extrahepatic biliary ductal dilatation (common bile duct   7mm), which abruptly terminates at level of an amorphous hypoechoic mass, measuring about 22 x 84mm in size.  Mass seems to invade the portal confluence and superior mesenteric vein.  There are a few scattered malignant-appearing peripancreatic lymph nodes.  Patient is post-cholecystectomy.  Mass was FNA biopsied x 3 with 25g needle.  Preliminary cytology, reviewed in my presence by Dr. Vicente Males, is worrisome for adenocarcinoma.  IMPRESSION:     Pancreatic head mass with obstructive jaundice. Assuming this is an adencarcinoma, it would be staged T3 N1 Mx by endoscopic ultrasound.  RECOMMENDATIONS:     1.  Watch for potential complications of the procedure. 2.  Await  final cytology results. 3.  Proceed with ERCP today for hopeful biliary stenting.   _______________________________ eSignedArta Silence, MD 06/29/2013 1:32 PM   CC:

## 2013-06-29 NOTE — Transfer of Care (Signed)
Immediate Anesthesia Transfer of Care Note  Patient: [name]  Procedure(s) Performed: Procedure(s): ESOPHAGEAL ENDOSCOPIC ULTRASOUND (EUS) RADIAL (N/A) ENDOSCOPIC RETROGRADE CHOLANGIOPANCREATOGRAPHY (ERCP) (N/A)  Patient Location: PACU and Endoscopy Unit  Anesthesia Type:General  Level of Consciousness: awake, alert  and patient cooperative  Airway & Oxygen Therapy: Patient Spontanous Breathing and Patient connected to face mask oxygen  Post-op Assessment: Report given to PACU RN, Post -op Vital signs reviewed and stable and Patient moving all extremities X 4  Post vital signs: Reviewed and stable  Complications: No apparent anesthesia complications

## 2013-06-30 ENCOUNTER — Encounter (HOSPITAL_COMMUNITY): Payer: Self-pay | Admitting: Gastroenterology

## 2013-06-30 ENCOUNTER — Other Ambulatory Visit: Payer: Self-pay | Admitting: *Deleted

## 2013-06-30 LAB — GLUCOSE, CAPILLARY
GLUCOSE-CAPILLARY: 74 mg/dL (ref 70–99)
Glucose-Capillary: 87 mg/dL (ref 70–99)
Glucose-Capillary: 116 mg/dL — ABNORMAL HIGH (ref 70–99)

## 2013-06-30 MED ORDER — ERGOCALCIFEROL 1.25 MG (50000 UT) PO CAPS: 50000.0000 [IU] | ORAL_CAPSULE | ORAL | Status: DC

## 2013-07-01 ENCOUNTER — Telehealth: Payer: Self-pay | Admitting: Oncology

## 2013-07-01 NOTE — Telephone Encounter (Signed)
S/W BARB @ DR. Mearl Latin OFFICE AND GAVE APPT FOR 06/30 @ 3:30, BARB WILL NOTFIY PATIENT OF APPT

## 2013-07-05 ENCOUNTER — Ambulatory Visit (HOSPITAL_BASED_OUTPATIENT_CLINIC_OR_DEPARTMENT_OTHER): Payer: Medicare PPO | Admitting: Oncology

## 2013-07-05 ENCOUNTER — Other Ambulatory Visit (HOSPITAL_BASED_OUTPATIENT_CLINIC_OR_DEPARTMENT_OTHER): Payer: Medicare PPO

## 2013-07-05 ENCOUNTER — Other Ambulatory Visit: Payer: Medicare PPO

## 2013-07-05 ENCOUNTER — Telehealth: Payer: Self-pay | Admitting: Oncology

## 2013-07-05 ENCOUNTER — Ambulatory Visit: Payer: Medicare PPO

## 2013-07-05 VITALS — BP 135/46 | HR 54 | Temp 97.8°F | Resp 20 | Ht 68.0 in | Wt 256.1 lb

## 2013-07-05 DIAGNOSIS — Z803 Family history of malignant neoplasm of breast: Secondary | ICD-10-CM

## 2013-07-05 DIAGNOSIS — N189 Chronic kidney disease, unspecified: Principal | ICD-10-CM

## 2013-07-05 DIAGNOSIS — Z800 Family history of malignant neoplasm of digestive organs: Secondary | ICD-10-CM

## 2013-07-05 DIAGNOSIS — N039 Chronic nephritic syndrome with unspecified morphologic changes: Secondary | ICD-10-CM

## 2013-07-05 DIAGNOSIS — D631 Anemia in chronic kidney disease: Secondary | ICD-10-CM

## 2013-07-05 DIAGNOSIS — Z8051 Family history of malignant neoplasm of kidney: Secondary | ICD-10-CM

## 2013-07-05 DIAGNOSIS — D649 Anemia, unspecified: Secondary | ICD-10-CM

## 2013-07-05 DIAGNOSIS — C250 Malignant neoplasm of head of pancreas: Secondary | ICD-10-CM

## 2013-07-05 LAB — CBC WITH DIFFERENTIAL/PLATELET
BASO%: 1.4 % (ref 0.0–2.0)
BASOS ABS: 0.1 10*3/uL (ref 0.0–0.1)
EOS%: 6.4 % (ref 0.0–7.0)
Eosinophils Absolute: 0.5 10*3/uL (ref 0.0–0.5)
HCT: 31.4 % — ABNORMAL LOW (ref 34.8–46.6)
HEMOGLOBIN: 9.9 g/dL — AB (ref 11.6–15.9)
LYMPH#: 1.7 10*3/uL (ref 0.9–3.3)
LYMPH%: 20.7 % (ref 14.0–49.7)
MCH: 28.2 pg (ref 25.1–34.0)
MCHC: 31.4 g/dL — ABNORMAL LOW (ref 31.5–36.0)
MCV: 89.6 fL (ref 79.5–101.0)
MONO#: 0.7 10*3/uL (ref 0.1–0.9)
MONO%: 8.5 % (ref 0.0–14.0)
NEUT#: 5.1 10*3/uL (ref 1.5–6.5)
NEUT%: 63 % (ref 38.4–76.8)
Platelets: 246 10*3/uL (ref 145–400)
RBC: 3.51 10*6/uL — ABNORMAL LOW (ref 3.70–5.45)
RDW: 15.9 % — AB (ref 11.2–14.5)
WBC: 8.1 10*3/uL (ref 3.9–10.3)

## 2013-07-05 MED ORDER — EPOETIN ALFA 40000 UNIT/ML IJ SOLN
40000.0000 [IU] | INTRAMUSCULAR | Status: DC
  Administered 2013-07-05: 40000 [IU] via SUBCUTANEOUS
  Filled 2013-07-05: qty 1

## 2013-07-05 NOTE — Telephone Encounter (Signed)
Gave pt appt for labs, genetics and MD visit for July 2015

## 2013-07-05 NOTE — Progress Notes (Signed)
Met with Barton Fanny Carie and family. Explained role of nurse navigator. Educational information provided on pancreatic cancer  Referral made to dietician for diet education. [place] resources provided to patient, including SW service and support group information.  Contact names and phone numbers were provided for entire [facility] team.  Teach back method was used.  No barriers to care identified.  Will continue to follow as needed.

## 2013-07-06 ENCOUNTER — Telehealth: Payer: Self-pay | Admitting: *Deleted

## 2013-07-06 LAB — FERRITIN CHCC: FERRITIN: 817 ng/mL — AB (ref 9–269)

## 2013-07-06 LAB — IRON AND TIBC CHCC
%SAT: 32 % (ref 21–57)
Iron: 72 ug/dL (ref 41–142)
TIBC: 225 ug/dL — ABNORMAL LOW (ref 236–444)
UIBC: 153 ug/dL (ref 120–384)

## 2013-07-06 NOTE — Progress Notes (Addendum)
[facility] Patient Consult   Referring MD: Eleora Sutherland 80 y.o.  1932/09/13    Reason for Referral: Pancreas cancer.   HPI: Ms. Rubalcava is a 80 year old woman followed by Dr. Benay Spice for chronic anemia secondary to renal insufficiency maintained on erythropoietin.  She was recently found to have abnormal LFTs. Abdominal ultrasound on 06/21/2013 showed intrahepatic and extrahepatic biliary ductal dilatation. The pancreatic duct was borderline dilated. There was no definite obstructing process.   MRCP on 06/26/2013 showed severe intra-and extrahepatic biliary ductal dilatation. The common bile duct abruptly terminated in the region of the superior aspect of the pancreatic head where there was a masslike area of amorphous soft tissue.   Endoscopic ultrasound on 06/29/2013 showed significant intra-and extrahepatic biliary ductal dilatation, pancreatic head mass that seem to invade the portal confluence and superior mesenteric vein. There were a few scattered malignant appearing peripancreatic lymph nodes. Stage by endoscopic ultrasound T3 N1 Mx. Biopsy of the pancreas head mass showed malignant cells consistent with adenocarcinoma.   ERCP, also on 06/29/2013, showed the distal 1 cm of the bile duct to be normal, followed by a 3-4 cm long tight mid common bile duct stricture with upstream intra-and extrahepatic biliary ductal dilatation. The stricture was dilated and a fully covered biliary wall stent was placed.  Past Medical History  Diagnosis Date  . Hypertension   . Heart failure     sees dr Iona Coach at [facility] every 6 months  . Asthma   . Diabetes mellitus, type 2   . Chronic kidney disease     sees dr Lorrene Reid nephrology every 4 months  . Anemia     on erythropoietin   . Sleep apnea     cpap setting of 2  Hypothyroid  Past Surgical History  Procedure Laterality Date  . Cholecystectomy  1973  . Total abdominal hysterectomy  1982  . Tubal  ligation  1973  . Breast reduction surgery  2002  . Eus N/A 06/29/2013    Procedure: ESOPHAGEAL ENDOSCOPIC ULTRASOUND (EUS) RADIAL;  Surgeon: Arta Silence, MD;  Location: [facility] ENDOSCOPY;  Service: Endoscopy;  Laterality: N/A;  . Ercp N/A 06/29/2013    Procedure: ENDOSCOPIC RETROGRADE CHOLANGIOPANCREATOGRAPHY (ERCP);  Surgeon: Arta Silence, MD;  Location: [facility];  Service: Endoscopy;  Laterality: N/A;    Medications: Reviewed  Allergies:  Allergies  Allergen Reactions  . Percocet [Oxycodone-Acetaminophen] Nausea And Vomiting  . Codeine     Felt like "my head was going to explode"  . Fish Allergy Itching  . Iodine Itching  . Zantac [Ranitidine Hcl] Nausea And Vomiting    Family history: Sister diagnosed with breast cancer in her 2s. A second sister diagnosed with breast cancer in her 90s. Brother deceased with kidney cancer. Niece deceased with pancreatic cancer at age 73.  Social History: Ms. Coccia lives in [place]. She is married. She has 2 daughters both reported to be in good health. She is a retired Licensed conveyancer. She smoked cigarettes in her 38s. No alcohol use. She thinks that she has had one blood transfusion in the past.  ROS:   She has had recent jaundice and itching. Urine is dark. She has noted yellow stools. She denies abdominal and back pain. Appetite is poor. She has lost about 20 pounds over the past 6 weeks. She denies fevers, sweats and chills. She has stable dyspnea which she relates to asthma. No chest pain. She denies leg swelling. No diarrhea. She denies bleeding.  No dysuria.   Physical Exam:  Blood pressure 135/46, pulse 54, temperature 97.8 F (36.6 C), temperature source Oral, resp. rate 20, height 5' 8"  (1.727 m), weight 256 lb 1.6 oz (116.166 kg).  HEENT: Scleral icterus. No thrush or ulcerations. Lungs: Lungs are clear. No wheezes or rales. Cardiac: Regular cardiac rhythm. Abdomen: Soft and nontender. No mass. No organomegaly.  Vascular: Trace  edema at the lower legs bilaterally, chronic stasis change. Lymph nodes: No palpable cervical, supraclavicular or axillary lymph nodes. Neurologic: Alert and oriented. Motor strength appears intact.   LAB:  CBC  Lab Results  Component Value Date   WBC 8.1 07/05/2013   HGB 9.9* 07/05/2013   HCT 31.4* 07/05/2013   MCV 89.6 07/05/2013   PLT 246 07/05/2013   NEUTROABS 5.1 07/05/2013     CMP      Component Value Date/Time   NA 140 05/31/2013 0957   K 4.6 05/31/2013 0957   CL 105 05/31/2013 0957   CO2 26 05/31/2013 0957   GLUCOSE 152* 05/31/2013 0957   BUN 39* 05/31/2013 0957   CREATININE 1.9* 05/31/2013 0957   CALCIUM 9.1 05/31/2013 0957   PROT 6.6 05/31/2013 0957   ALBUMIN 2.9* 05/31/2013 0957   AST 106* 05/31/2013 0957   ALT 144* 05/31/2013 0957   ALKPHOS 331* 05/31/2013 0957   BILITOT 1.4* 05/31/2013 0957   GFRNONAA 28* 02/07/2010 0505   GFRAA  Value: 34        The eGFR has been calculated using the MDRD equation. This calculation has not been validated in all clinical situations. eGFR's persistently <60 mL/min signify possible Chronic Kidney Disease.* 02/07/2010 0505      Imaging:  As per HPI   Assessment/Plan:   1. Pancreas cancer.  Abdominal ultrasound 06/21/2013 with intrahepatic and extra hepatic biliary ductal dilatation. Pancreatic duct borderline dilated.  MRCP 06/26/2013 with severe intra and extrahepatic biliary ductal dilatation. The common bile duct abruptly terminated in the region of the superior aspect of the pancreatic head where there was a masslike area of amorphous soft tissue.   Endoscopic ultrasound 06/29/2013 with significant intra-and extrahepatic biliary ductal dilatation, pancreatic head mass that seemed to invade the portal confluence and superior mesenteric vein. A few scattered malignant appearing peripancreatic lymph nodes. EUS staging T3 N1 Mx.  Biopsy of the pancreas head mass showed malignant cells consistent with adenocarcinoma. 2. Obstructive  jaundice secondary to #1 status post ERCP with findings of a 3-4 cm long tight mid common bile duct stricture with upstream intra-and extrahepatic biliary ductal dilatation status post balloon dilation and stent placement 06/29/2013. 3. Hypertension. 4. Diabetes. 5. Heart failure. 6. Chronic kidney disease. 7. Anemia. 8. Hypothyroid. 9. Sleep apnea. 10. Asthma.    Disposition: Ms. Bautch has been diagnosed with pancreas cancer. Dr. Benay Spice reviewed the diagnosis, prognosis and treatment options with her and her family. They understand the only potentially curative therapy is surgery. They also understand that Dr. Benay Spice feels it is unlikely she is a surgical candidate due to multiple comorbidities as well as probable invasion of the portal vein and superior mesenteric vein.  Options were discussed including a supportive care approach versus a trial of systemic therapy. Dr. Benay Spice reviewed the FOLFIRINOX regimen, gemcitabine/Abraxane and gemcitabine alone with Ms. Vidovich and her family. She is not felt to be a good candidate for the more aggressive FOLFIRINOX regimen due to the multiple comorbid conditions and as such Dr. Benay Spice recommends gemcitabine/Abraxane if she decides to proceed with a trial of chemotherapy.  We will refer her for a PET scan to complete the staging evaluation and will obtain a CA 19-9 at the time of her next visit.  We also made a referral for genetics counseling due to her family history.  Ms. Remlinger has an appointment at [facility] next week for a second opinion. She will return for a followup visit here on 07/19/2013 to review the PET scan result and for additional discussion.  Patient seen with Dr. Benay Spice. 50 minutes were spent face-to-face at today's visit with the majority of that time involved in counseling/coordination of care.  Ned Card, ANP/GNP-BC 07/06/2013, 1:11 PM This was a shared visit with Ned Card.  Ms. Morency has been diagnosed with pancreas  cancer.  She is not a surgical candidated.  We will complete a staging PET scan and she will return for further discussion.  Ms. Hoheisel has multiple comorbid conditions. Comfort care/hospice is a reasonable option.  We will discuss hospice vs a trial of systemic therapy when she returns on 7/14. I see no clear role for radiation at present.   Julieanne Manson

## 2013-07-06 NOTE — Telephone Encounter (Signed)
Call from pt's daughter, Lanette Ell requesting medical records to be sent to [facility]. Please send records to [facility] [phone] fax: [phone]. Pt has appt at [facility] 07/15/13. Request to Maudie Mercury in HIM to send records.

## 2013-07-07 ENCOUNTER — Telehealth: Payer: Self-pay | Admitting: Oncology

## 2013-07-07 NOTE — Telephone Encounter (Signed)
Faxed pt medical records to [facility] °

## 2013-07-13 ENCOUNTER — Ambulatory Visit (HOSPITAL_COMMUNITY)
Admission: RE | Admit: 2013-07-13 | Discharge: 2013-07-13 | Disposition: A | Discharge status: Home / Self Care | Payer: Medicare PPO | Source: Ambulatory Visit | Attending: Nurse Practitioner | Admitting: Nurse Practitioner

## 2013-07-13 ENCOUNTER — Ambulatory Visit: Payer: Medicare PPO | Admitting: Nutrition

## 2013-07-13 DIAGNOSIS — R6889 Other general symptoms and signs: Secondary | ICD-10-CM | POA: Insufficient documentation

## 2013-07-13 DIAGNOSIS — C250 Malignant neoplasm of head of pancreas: Secondary | ICD-10-CM

## 2013-07-13 DIAGNOSIS — C259 Malignant neoplasm of pancreas, unspecified: Secondary | ICD-10-CM | POA: Insufficient documentation

## 2013-07-13 LAB — GLUCOSE, CAPILLARY: Glucose-Capillary: 100 mg/dL — ABNORMAL HIGH (ref 70–99)

## 2013-07-13 MED ORDER — FLUDEOXYGLUCOSE F - 18 (FDG) INJECTION
13.8000 | Freq: Once | INTRAVENOUS | Status: AC | PRN
  Administered 2013-07-13: 13.8 via INTRAVENOUS

## 2013-07-13 NOTE — Progress Notes (Signed)
80 year old female diagnosed with pancreas cancer.  She is a patient of Dr. Benay Spice.  Past medical history includes hypertension, heart failure, chronic kidney disease, diabetes, anemia, asthma, and hypothyroidism.  Medications include, Questran, Colace, vitamin D2, ferrous sulfate, Lasix, NovoLog, Lantus, Synthroid, and MiraLax.  Labs include a glucose of 116.  Height: 68 inches.   Weight: 256.1 pounds June 30. Usual body weight: 276 pounds February 2015. BMI: 38.95.  Patient reports poor appetite and nausea.  She has had a 20 pound weight loss over the past 4 months.  She believes this is because she is definitely eating less.  States nausea has improved and her oral intake has increased over the past 2 days.  Patient is waiting on treatment plan.  Nutrition diagnosis: Unintended weight loss related to inadequate oral intake and poor appetite as evidenced by 20 pound weight loss in 4 months.  Intervention: Patient was educated to consume smaller, more frequent meals and snacks with protein at each meal and snack.  Recommended patient try Glucerna or boost glucose control as a snack.  Reviewed strategies for eating if nausea returns.  Reviewed high-protein foods sources.  Monitoring, evaluation, goals: Patient will tolerate adequate calories and protein for weight maintenance and glycemic control.  Next visit: Patient has my contact information and will call me with questions.  Once treatment plan determined, will followup as needed.    **Disclaimer: This note was dictated with voice recognition software. Similar sounding words can inadvertently be transcribed and this note may contain transcription errors which may not have been corrected upon publication of note.**

## 2013-07-19 ENCOUNTER — Other Ambulatory Visit (HOSPITAL_BASED_OUTPATIENT_CLINIC_OR_DEPARTMENT_OTHER): Payer: Medicare PPO

## 2013-07-19 ENCOUNTER — Telehealth: Payer: Self-pay | Admitting: Oncology

## 2013-07-19 ENCOUNTER — Ambulatory Visit (HOSPITAL_BASED_OUTPATIENT_CLINIC_OR_DEPARTMENT_OTHER): Payer: Medicare PPO | Admitting: Oncology

## 2013-07-19 VITALS — BP 147/34 | HR 60 | Temp 98.5°F | Resp 18 | Ht 68.0 in | Wt 259.1 lb

## 2013-07-19 DIAGNOSIS — D631 Anemia in chronic kidney disease: Secondary | ICD-10-CM

## 2013-07-19 DIAGNOSIS — C250 Malignant neoplasm of head of pancreas: Secondary | ICD-10-CM

## 2013-07-19 DIAGNOSIS — N039 Chronic nephritic syndrome with unspecified morphologic changes: Secondary | ICD-10-CM

## 2013-07-19 DIAGNOSIS — C259 Malignant neoplasm of pancreas, unspecified: Secondary | ICD-10-CM | POA: Insufficient documentation

## 2013-07-19 DIAGNOSIS — C774 Secondary and unspecified malignant neoplasm of inguinal and lower limb lymph nodes: Secondary | ICD-10-CM

## 2013-07-19 DIAGNOSIS — N189 Chronic kidney disease, unspecified: Secondary | ICD-10-CM

## 2013-07-19 DIAGNOSIS — E039 Hypothyroidism, unspecified: Secondary | ICD-10-CM

## 2013-07-19 LAB — CBC WITH DIFFERENTIAL/PLATELET
BASO%: 1 % (ref 0.0–2.0)
BASOS ABS: 0.1 10*3/uL (ref 0.0–0.1)
EOS%: 5.8 % (ref 0.0–7.0)
Eosinophils Absolute: 0.4 10*3/uL (ref 0.0–0.5)
HCT: 28.5 % — ABNORMAL LOW (ref 34.8–46.6)
HEMOGLOBIN: 9.1 g/dL — AB (ref 11.6–15.9)
LYMPH%: 26.1 % (ref 14.0–49.7)
MCH: 28.6 pg (ref 25.1–34.0)
MCHC: 32 g/dL (ref 31.5–36.0)
MCV: 89.3 fL (ref 79.5–101.0)
MONO#: 0.5 10*3/uL (ref 0.1–0.9)
MONO%: 7.8 % (ref 0.0–14.0)
NEUT#: 3.7 10*3/uL (ref 1.5–6.5)
NEUT%: 59.3 % (ref 38.4–76.8)
Platelets: 242 10*3/uL (ref 145–400)
RBC: 3.19 10*6/uL — AB (ref 3.70–5.45)
RDW: 17.2 % — AB (ref 11.2–14.5)
WBC: 6.2 10*3/uL (ref 3.9–10.3)
lymph#: 1.6 10*3/uL (ref 0.9–3.3)

## 2013-07-19 LAB — COMPREHENSIVE METABOLIC PANEL (CC13)
ALK PHOS: 131 U/L (ref 40–150)
ALT: 33 U/L (ref 0–55)
AST: 28 U/L (ref 5–34)
Albumin: 2.6 g/dL — ABNORMAL LOW (ref 3.5–5.0)
Anion Gap: 7 mEq/L (ref 3–11)
BILIRUBIN TOTAL: 1.95 mg/dL — AB (ref 0.20–1.20)
BUN: 40.1 mg/dL — AB (ref 7.0–26.0)
CO2: 27 mEq/L (ref 22–29)
CREATININE: 1.7 mg/dL — AB (ref 0.6–1.1)
Calcium: 9.2 mg/dL (ref 8.4–10.4)
Chloride: 109 mEq/L (ref 98–109)
GLUCOSE: 170 mg/dL — AB (ref 70–140)
Potassium: 5.1 mEq/L (ref 3.5–5.1)
Sodium: 143 mEq/L (ref 136–145)
Total Protein: 6.1 g/dL — ABNORMAL LOW (ref 6.4–8.3)

## 2013-07-19 NOTE — Progress Notes (Signed)
  [facility] OFFICE PROGRESS NOTE   Diagnosis: Pancreas cancer  INTERVAL HISTORY:   [name] returns as scheduled. She reports a good appetite. The pruritus has resolved. She was seen at [facility] on 07/13/2013 and has a followup appointment scheduled for later this week.  Objective:  Vital signs in last 24 hours:  Blood pressure 147/34, pulse 60, temperature 98.5 F (36.9 C), temperature source Oral, resp. rate 18, height 5\' 8"  (1.727 m), weight 259 lb 1.6 oz (117.527 kg), SpO2 100.00%.    Lymphatics: Approximate 3-4 cm deep left inguinal node. The node is palpable medial to the upper aspect of the  Groin groove Resp: Scattered inspiratory rhonchi and wheezes, no respiratory distress Cardio: Irregular GI: No hepatomegaly, nontender, no mass Vascular: Chronic stasis change at the low leg bilaterally  Skin: Yeast rash beneath the abdominal pannus     Lab Results:  Lab Results  Component Value Date   WBC 6.2 07/19/2013   HGB 9.1* 07/19/2013   HCT 28.5* 07/19/2013   MCV 89.3 07/19/2013   PLT 242 07/19/2013   NEUTROABS 3.7 07/19/2013   BUN 40.1, creatinine 1.7, alkaline phosphatase 131, bilirubin 1.95  Imaging: PET scan 07/13/2013-hypermetabolic nodule along the right atrium precordial fat, small hypermetabolic nodule in the left parotid measuring 9 mm, hypermetabolic mass in the head of the pancreas, enlarged hypermetabolic left inguinal node measuring 3.9 cm  I reviewed the PET images with [name]  Medications: I have reviewed the patient's current medications.  Assessment/Plan: 1. Pancreas cancer. Abdominal ultrasound 06/21/2013 with intrahepatic and extra hepatic biliary ductal dilatation. Pancreatic duct borderline dilated.  MRCP 06/26/2013 with severe intra and extrahepatic biliary ductal dilatation. The common bile duct abruptly terminated in the region of the superior aspect of the pancreatic head where there was a masslike area of amorphous soft tissue.    Endoscopic ultrasound 06/29/2013 with significant intra-and extrahepatic biliary ductal dilatation, pancreatic head mass that seemed to invade the portal confluence and superior mesenteric vein. A few scattered malignant appearing peripancreatic lymph nodes. EUS staging T3 N1 Mx.  Biopsy of the pancreas head mass showed malignant cells consistent with adenocarcinoma. Staging PET scan with a hypermetabolic pancreas head mass,. Precordial nodule, and enlarged hypermetabolic left inguinal node 2. 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 3. Hypertension. 4. Diabetes. 5. Heart failure. 6. Chronic kidney disease. 7. Anemia. 8. Hypothyroid. 9. Sleep apnea. 10. Asthma.  Disposition:  [name] has an improved performance status since placement of the bile duct stent. She appears to have metastatic disease involving a left inguinal lymph node and potentially a precordial nodule. This is an unusual pattern of tumor spread and a biopsy is indicated if she will be treated with systemic therapy.  I discussed supportive care, single agent gemcitabine, and every 2 week gemcitabine/Abraxane with her. She will return for a followup appointment at [facility] later this week. [name] will return for an office visit and further discussion on 08/03/2013. We are available to see her sooner as needed.  She has multiple comorbid conditions which will limit her ability to benefit from chemotherapy. She will discuss consolidating her medications with Dr. Sabra Heck.  Betsy Coder, MD  07/19/2013  4:34 PM

## 2013-07-19 NOTE — Telephone Encounter (Signed)
added appt for 7/29. 7/14 pof did not specify when pt should return. per BS pt to be seen 7/29 @ 11:30am. AVS report w/all appts printed for pt.

## 2013-07-20 LAB — CANCER ANTIGEN 19-9: CA 19-9: 65.6 U/mL — ABNORMAL HIGH (ref <35.0)

## 2013-07-25 ENCOUNTER — Other Ambulatory Visit: Payer: Self-pay | Admitting: *Deleted

## 2013-07-25 DIAGNOSIS — C259 Malignant neoplasm of pancreas, unspecified: Secondary | ICD-10-CM

## 2013-07-25 DIAGNOSIS — D649 Anemia, unspecified: Secondary | ICD-10-CM

## 2013-07-26 ENCOUNTER — Ambulatory Visit (HOSPITAL_BASED_OUTPATIENT_CLINIC_OR_DEPARTMENT_OTHER): Payer: Medicare PPO

## 2013-07-26 ENCOUNTER — Other Ambulatory Visit (HOSPITAL_BASED_OUTPATIENT_CLINIC_OR_DEPARTMENT_OTHER): Payer: Medicare PPO

## 2013-07-26 VITALS — BP 141/46 | HR 47 | Temp 98.5°F

## 2013-07-26 DIAGNOSIS — N039 Chronic nephritic syndrome with unspecified morphologic changes: Secondary | ICD-10-CM

## 2013-07-26 DIAGNOSIS — C250 Malignant neoplasm of head of pancreas: Secondary | ICD-10-CM

## 2013-07-26 DIAGNOSIS — D631 Anemia in chronic kidney disease: Secondary | ICD-10-CM

## 2013-07-26 DIAGNOSIS — N189 Chronic kidney disease, unspecified: Secondary | ICD-10-CM

## 2013-07-26 DIAGNOSIS — D649 Anemia, unspecified: Secondary | ICD-10-CM

## 2013-07-26 DIAGNOSIS — C259 Malignant neoplasm of pancreas, unspecified: Secondary | ICD-10-CM

## 2013-07-26 LAB — CBC WITH DIFFERENTIAL/PLATELET
BASO%: 0.5 % (ref 0.0–2.0)
BASOS ABS: 0 10*3/uL (ref 0.0–0.1)
EOS ABS: 0.4 10*3/uL (ref 0.0–0.5)
EOS%: 6.3 % (ref 0.0–7.0)
HEMATOCRIT: 28.2 % — AB (ref 34.8–46.6)
HEMOGLOBIN: 8.9 g/dL — AB (ref 11.6–15.9)
LYMPH%: 33.3 % (ref 14.0–49.7)
MCH: 28.5 pg (ref 25.1–34.0)
MCHC: 31.6 g/dL (ref 31.5–36.0)
MCV: 90.4 fL (ref 79.5–101.0)
MONO#: 0.6 10*3/uL (ref 0.1–0.9)
MONO%: 9.2 % (ref 0.0–14.0)
NEUT%: 50.7 % (ref 38.4–76.8)
NEUTROS ABS: 3.3 10*3/uL (ref 1.5–6.5)
PLATELETS: 223 10*3/uL (ref 145–400)
RBC: 3.12 10*6/uL — ABNORMAL LOW (ref 3.70–5.45)
RDW: 16.5 % — ABNORMAL HIGH (ref 11.2–14.5)
WBC: 6.5 10*3/uL (ref 3.9–10.3)
lymph#: 2.2 10*3/uL (ref 0.9–3.3)

## 2013-07-26 MED ORDER — EPOETIN ALFA 40000 UNIT/ML IJ SOLN
40000.0000 [IU] | INTRAMUSCULAR | Status: DC
  Administered 2013-07-26: 40000 [IU] via SUBCUTANEOUS
  Filled 2013-07-26: qty 1

## 2013-08-03 ENCOUNTER — Ambulatory Visit: Payer: Medicare PPO | Admitting: Oncology

## 2013-08-04 ENCOUNTER — Other Ambulatory Visit: Payer: Medicare PPO

## 2013-08-16 ENCOUNTER — Other Ambulatory Visit: Payer: Self-pay | Admitting: *Deleted

## 2013-08-16 ENCOUNTER — Ambulatory Visit (INDEPENDENT_AMBULATORY_CARE_PROVIDER_SITE_OTHER): Payer: Medicare PPO | Admitting: Podiatry

## 2013-08-16 ENCOUNTER — Encounter: Payer: Self-pay | Admitting: Podiatry

## 2013-08-16 ENCOUNTER — Ambulatory Visit (HOSPITAL_BASED_OUTPATIENT_CLINIC_OR_DEPARTMENT_OTHER): Payer: Medicare PPO

## 2013-08-16 ENCOUNTER — Other Ambulatory Visit (HOSPITAL_BASED_OUTPATIENT_CLINIC_OR_DEPARTMENT_OTHER): Payer: Medicare PPO

## 2013-08-16 VITALS — BP 148/44 | HR 44

## 2013-08-16 DIAGNOSIS — N039 Chronic nephritic syndrome with unspecified morphologic changes: Secondary | ICD-10-CM

## 2013-08-16 DIAGNOSIS — D631 Anemia in chronic kidney disease: Secondary | ICD-10-CM

## 2013-08-16 DIAGNOSIS — N189 Chronic kidney disease, unspecified: Secondary | ICD-10-CM

## 2013-08-16 DIAGNOSIS — D649 Anemia, unspecified: Secondary | ICD-10-CM

## 2013-08-16 DIAGNOSIS — M79609 Pain in unspecified limb: Secondary | ICD-10-CM

## 2013-08-16 DIAGNOSIS — M79676 Pain in unspecified toe(s): Secondary | ICD-10-CM

## 2013-08-16 DIAGNOSIS — E119 Type 2 diabetes mellitus without complications: Secondary | ICD-10-CM

## 2013-08-16 DIAGNOSIS — Q828 Other specified congenital malformations of skin: Secondary | ICD-10-CM

## 2013-08-16 DIAGNOSIS — B351 Tinea unguium: Secondary | ICD-10-CM

## 2013-08-16 LAB — CBC WITH DIFFERENTIAL/PLATELET
BASO%: 0.5 % (ref 0.0–2.0)
Basophils Absolute: 0 10*3/uL (ref 0.0–0.1)
EOS ABS: 0.4 10*3/uL (ref 0.0–0.5)
EOS%: 5.8 % (ref 0.0–7.0)
HEMATOCRIT: 30.7 % — AB (ref 34.8–46.6)
HEMOGLOBIN: 9.6 g/dL — AB (ref 11.6–15.9)
LYMPH#: 2 10*3/uL (ref 0.9–3.3)
LYMPH%: 32.8 % (ref 14.0–49.7)
MCH: 28.3 pg (ref 25.1–34.0)
MCHC: 31.3 g/dL — ABNORMAL LOW (ref 31.5–36.0)
MCV: 90.6 fL (ref 79.5–101.0)
MONO#: 0.5 10*3/uL (ref 0.1–0.9)
MONO%: 8 % (ref 0.0–14.0)
NEUT%: 52.9 % (ref 38.4–76.8)
NEUTROS ABS: 3.3 10*3/uL (ref 1.5–6.5)
Platelets: 194 10*3/uL (ref 145–400)
RBC: 3.39 10*6/uL — ABNORMAL LOW (ref 3.70–5.45)
RDW: 15.3 % — ABNORMAL HIGH (ref 11.2–14.5)
WBC: 6.2 10*3/uL (ref 3.9–10.3)
nRBC: 0 % (ref 0–0)

## 2013-08-16 MED ORDER — EPOETIN ALFA 40000 UNIT/ML IJ SOLN
40000.0000 [IU] | INTRAMUSCULAR | Status: DC
  Administered 2013-08-16: 40000 [IU] via SUBCUTANEOUS
  Filled 2013-08-16: qty 1

## 2013-08-16 NOTE — Progress Notes (Signed)
Recently diagnosed with pancreatic cancer she presents today for chief complaint of painful elongated toenails and calluses.  Objective: Vital signs are stable she is alert and oriented x3. Pulses are palpable bilateral. Her nails are thick yellow dystrophic with mycotic and painful palpation as R. her calluses to the plantar aspect of the bilateral foot these are porokeratosis in nature.  Assessment: Diabetic with pain in limb secondary to onychomycosis and porokeratosis bilateral.  Plan: Debridement of nails 1 through 5 bilateral and debridement of reactive hyperkeratosis.

## 2013-08-16 NOTE — Patient Instructions (Signed)

## 2013-08-17 ENCOUNTER — Other Ambulatory Visit: Payer: Self-pay | Admitting: Nurse Practitioner

## 2013-08-29 ENCOUNTER — Other Ambulatory Visit (INDEPENDENT_AMBULATORY_CARE_PROVIDER_SITE_OTHER): Payer: Medicare PPO

## 2013-08-29 DIAGNOSIS — E1165 Type 2 diabetes mellitus with hyperglycemia: Principal | ICD-10-CM

## 2013-08-29 DIAGNOSIS — IMO0001 Reserved for inherently not codable concepts without codable children: Secondary | ICD-10-CM

## 2013-08-29 LAB — COMPREHENSIVE METABOLIC PANEL
ALBUMIN: 3.2 g/dL — AB (ref 3.5–5.2)
ALT: 14 U/L (ref 0–35)
AST: 19 U/L (ref 0–37)
Alkaline Phosphatase: 60 U/L (ref 39–117)
BUN: 44 mg/dL — AB (ref 6–23)
CALCIUM: 9.3 mg/dL (ref 8.4–10.5)
CHLORIDE: 108 meq/L (ref 96–112)
CO2: 28 mEq/L (ref 19–32)
Creatinine, Ser: 1.7 mg/dL — ABNORMAL HIGH (ref 0.4–1.2)
GFR: 38.32 mL/min — ABNORMAL LOW (ref >60.00)
GLUCOSE: 80 mg/dL (ref 70–99)
POTASSIUM: 4.4 meq/L (ref 3.5–5.1)
SODIUM: 145 meq/L (ref 135–145)
TOTAL PROTEIN: 6.8 g/dL (ref 6.0–8.3)
Total Bilirubin: 0.6 mg/dL (ref 0.2–1.2)

## 2013-08-29 LAB — HEMOGLOBIN A1C: Hgb A1c MFr Bld: 4.6 % (ref 4.6–6.5)

## 2013-08-31 ENCOUNTER — Ambulatory Visit (INDEPENDENT_AMBULATORY_CARE_PROVIDER_SITE_OTHER): Payer: Medicare PPO | Admitting: Endocrinology

## 2013-08-31 ENCOUNTER — Encounter: Payer: Self-pay | Admitting: Endocrinology

## 2013-08-31 VITALS — BP 135/67 | HR 60 | Temp 98.5°F | Resp 16 | Ht 68.0 in | Wt 265.8 lb

## 2013-08-31 DIAGNOSIS — N183 Chronic kidney disease, stage 3 unspecified: Secondary | ICD-10-CM

## 2013-08-31 DIAGNOSIS — E1165 Type 2 diabetes mellitus with hyperglycemia: Principal | ICD-10-CM

## 2013-08-31 DIAGNOSIS — IMO0001 Reserved for inherently not codable concepts without codable children: Secondary | ICD-10-CM

## 2013-08-31 NOTE — Progress Notes (Signed)
Patient ID: [name], female   DOB: 08/11/1935, 80 y.o.   MRN: [id number]   Reason for Appointment: Diabetes follow-up    History of Present Illness   Diagnosis: Type 2 DIABETES MELITUS, date of diagnosis:  1997  Past history: She was started on insulin within a year of diagnosis; had diarrhea from metformin and swelling from Actos Generally her blood sugars are fairly well-controlled with basal bolus insulin regimen and A1c mostly below 7 although this will not be representative of her actual blood sugars because of her renal dysfunction and anemia  RECENT history:  Insulin regimen: Lantus 38-40 units a.m., 16  units PM and NovoLog  04-11-16 a.c.         She has recently been diagnosed to have pancreatic cancer Because of decreased appetite when she had biliary tract obstruction her blood sugars started going down lower and she was eating less; was taking somewhat less mealtime insulin She reduce her morning Lantus only by 2 units but she did have a significantly low sugar at night once about 4 weeks ago Also was having occasional readings as low as 54 waking up and 49 at 11 AM Recently she appears to have low normal fasting blood sugars with average blood sugar readings below 100 She is still taking some readings and evenings and mostly at bedtime to decide if she needs a bedtime snack Her blood sugars and evenings are somewhat variable after supper is only occasionally high She is fairly compliant with doing her insulin as directed before meals and Lantus twice a day  Hypoglycemia :  as above    Proper timing of medications in relation to meals: Yes.         Monitors blood glucose: Once a day.    Glucometer:  recently Accu-Chek with the following download:  PREMEAL Breakfast  9 AM-11 AM  Dinner Bedtime Overall  Glucose range:  54-134   49-185   53-126   66-205    Mean/median:  98     149  113    Meals:  2-3 meals per day.  breakfast: oatmeal, fruit. Supper 7 pm   Physical  activity:  unable to do much            Dietician visit: Most recent: 3/41          Complications: are: ? Nephropathy, last microalbumin/ creatinine ratio was 87 in 2/14     Wt Readings from Last 3 Encounters:  08/31/13 265 lb 12.8 oz (120.566 kg)  07/19/13 259 lb 1.6 oz (117.527 kg)  07/05/13 256 lb 1.6 oz (116.166 kg)   Lab Results  Component Value Date   HGBA1C 4.6 08/29/2013   HGBA1C 5.4 05/31/2013   HGBA1C 5.8 02/07/2013   Lab Results  Component Value Date   MICROALBUR 209.5* 05/31/2013   LDLCALC 130* 05/31/2013   CREATININE 1.7* 08/29/2013         Medication List       This list is accurate as of: 08/31/13  3:20 PM.  Always use your most recent med list.               ACCU-CHEK AVIVA PLUS W/DEVICE Kit  Use to check blood sugar 3 times per day dx code 250.02     ACCU-CHEK FASTCLIX LANCETS Misc  Use to obtain blood specimen 3 times per day     ADVAIR DISKUS 250-50 MCG/DOSE Aepb  Generic drug:  Fluticasone-Salmeterol  Inhale 1 puff into the lungs every  12 (twelve) hours.     albuterol 108 (90 BASE) MCG/ACT inhaler  Commonly known as:  PROVENTIL HFA;VENTOLIN HFA  Inhale 2 puffs into the lungs every 6 (six) hours as needed for wheezing or shortness of breath.     allopurinol 100 MG tablet  Commonly known as:  ZYLOPRIM  Take 1 tablet (100 mg total) by mouth daily.     aspirin EC 81 MG tablet  Take 81 mg by mouth daily.     cholestyramine 4 G packet  Commonly known as:  QUESTRAN  Take 4 g by mouth 3 (three) times daily with meals.     diltiazem 300 MG 24 hr capsule  Commonly known as:  CARDIZEM CD  Take 300 mg by mouth at bedtime.     docusate sodium 100 MG capsule  Commonly known as:  COLACE  Take 100 mg by mouth 2 (two) times daily as needed for mild constipation.     ergocalciferol 50000 UNITS capsule  Commonly known as:  VITAMIN D2  Take 1 capsule (50,000 Units total) by mouth once a week.     fenofibrate micronized 43 MG capsule  Commonly known  as:  ANTARA  Take 1 capsule (43 mg total) by mouth daily before breakfast.     ferrous sulfate 325 (65 FE) MG tablet  Take 325 mg by mouth daily.     furosemide 80 MG tablet  Commonly known as:  LASIX  Take 80 mg by mouth daily.     glucose blood test strip  Commonly known as:  ACCU-CHEK AVIVA PLUS  Use as instructed to check blood sugar 3 times per day dx code 250.02     insulin aspart 100 UNIT/ML injection  Commonly known as:  novoLOG  Inject 4-18 Units into the skin 3 (three) times daily with meals. 4 units before breakfast, 6 units before lunch, 18 units before supper     insulin glargine 100 UNIT/ML injection  Commonly known as:  LANTUS  Inject 18-40 Units into the skin 2 (two) times daily. 40 units Qam, 18 units before supper     Insulin Syringe-Needle U-100 31G X 5/16" 1 ML Misc  Commonly known as:  B-D INS SYR ULTRAFINE 1CC/31G  Inject 1 each into the skin 3 (three) times daily.     levothyroxine 25 MCG tablet  Commonly known as:  SYNTHROID, LEVOTHROID  Take 25 mcg by mouth at bedtime.     losartan 100 MG tablet  Commonly known as:  COZAAR  Take 100 mg by mouth every morning.     montelukast 10 MG tablet  Commonly known as:  SINGULAIR  Take 10 mg by mouth at bedtime.     omeprazole 20 MG capsule  Commonly known as:  PRILOSEC  Take 20 mg by mouth every morning.     polyethylene glycol packet  Commonly known as:  MIRALAX / GLYCOLAX  Take 17 g by mouth daily as needed for mild constipation.     pravastatin 20 MG tablet  Commonly known as:  PRAVACHOL  Take 20 mg by mouth daily.     PROCRIT IJ  Inject 40,000 Units as directed every 21 ( twenty-one) days.        Allergies:  Allergies  Allergen Reactions  . Percocet [Oxycodone-Acetaminophen] Nausea And Vomiting  . Codeine     Felt like "my head was going to explode"  . Fish Allergy Itching  . Iodine Itching  . Zantac [Ranitidine Hcl] Nausea And Vomiting  Past Medical History  Diagnosis Date  .  Hypertension   . Heart failure     sees dr Iona Coach at [facility] every 6 months  . Asthma   . Diabetes mellitus, type 2   . Chronic kidney disease     sees dr Lorrene Reid nephrology every 4 months  . Anemia     on iron  . Sleep apnea     cpap setting of 2  . Cancer     pancreatic     Past Surgical History  Procedure Laterality Date  . Cholecystectomy  1973  . Total abdominal hysterectomy  1982  . Tubal ligation  1973  . Breast reduction surgery  2002  . Eus N/A 06/29/2013    Procedure: ESOPHAGEAL ENDOSCOPIC ULTRASOUND (EUS) RADIAL;  Surgeon: Arta Silence, MD;  Location: [facility] ENDOSCOPY;  Service: Endoscopy;  Laterality: N/A;  . Ercp N/A 06/29/2013    Procedure: ENDOSCOPIC RETROGRADE CHOLANGIOPANCREATOGRAPHY (ERCP);  Surgeon: Arta Silence, MD;  Location: [facility];  Service: Endoscopy;  Laterality: N/A;    Family History  Problem Relation Age of Onset  . Asthma Sister   . Asthma Mother   . Heart disease Father   . Cancer Sister     breast cancer  . Cancer Brother     kidney    Social History:  reports that she quit smoking about 49 years ago. Her smoking use included Cigarettes. She has a 2.7 pack-year smoking history. She has never used smokeless tobacco. She reports that she does not drink alcohol or use illicit drugs.  Review of Systems:  HYPERTENSION:  long-standing and well controlled with current regimen   HYPOTHYROID: TSH has been normal with taking small dose  of 25 mcg levothyroxine. She is asking about taking Armour Thyroid because she continues to be somewhat fatigued  Lab Results  Component Value Date   TSH 1.75 05/31/2013    Chronic kidney disease: Followed by nephrologist periodically   She has not had any steroids recently for her COPD  Continues to get Aranesp for her anemia periodically, still complaining of feeling tired  Lab Results  Component Value Date   WBC 6.2 08/16/2013   HGB 9.6* 08/16/2013   HCT 30.7* 08/16/2013   MCV 90.6 08/16/2013    PLT 194 08/16/2013     HYPERLIPIDEMIA: The lipid abnormality consists mostly of high triglycerides, now well controlled; still has relatively low HDL. She has been treated  with fenofibrate and pravastatin. Doses of fenofibrate limited by her renal function   Lab Results  Component Value Date   CHOL 203* 05/31/2013   HDL 30.70* 05/31/2013   LDLCALC 130* 05/31/2013   TRIG 212.0* 05/31/2013   CHOLHDL 7 05/31/2013        Examination:   BP 135/67  Pulse 60  Temp(Src) 98.5 F (36.9 C)  Resp 16  Ht 5' 8"  (1.727 m)  Wt 265 lb 12.8 oz (120.566 kg)  BMI 40.42 kg/m2  SpO2 96%  Body mass index is 40.42 kg/(m^2).     ASSESSMENT/ PLAN:   Diabetes type 2   The patient's glucose readings are relatively lower because of her decreased appetite in the last couple months Although her blood sugars are looking fairly good recently and she is eating better she still has low normal fasting readings Discussed with her that with her diagnosis of inoperable pancreatic cancer we do not need to have strict control Her A1c of 4.9 is likely to be falsely low because of her anemia  She can be little more liberal in her diet and to start reducing her insulin at least the Lantus She can also reduce her mealtime dose when she is eating less  Insulin changes: Reduce Lantus in the morning to 38 units in the evening to at least 12  HYPERTENSION: Well controlled   Tarin Johndrow 08/31/2013, 3:20 PM

## 2013-08-31 NOTE — Patient Instructions (Addendum)
Lantus 38 units a.m., 12  units PM   If sugar in am < 100 reduce pm Lantus more

## 2013-09-09 ENCOUNTER — Other Ambulatory Visit: Payer: Self-pay | Admitting: Nurse Practitioner

## 2013-09-09 DIAGNOSIS — D649 Anemia, unspecified: Secondary | ICD-10-CM

## 2013-09-09 DIAGNOSIS — N183 Chronic kidney disease, stage 3 unspecified: Secondary | ICD-10-CM

## 2013-09-13 ENCOUNTER — Other Ambulatory Visit (HOSPITAL_BASED_OUTPATIENT_CLINIC_OR_DEPARTMENT_OTHER): Payer: Medicare PPO

## 2013-09-13 ENCOUNTER — Ambulatory Visit (HOSPITAL_BASED_OUTPATIENT_CLINIC_OR_DEPARTMENT_OTHER): Payer: Medicare PPO

## 2013-09-13 VITALS — BP 127/39 | HR 43 | Temp 98.4°F

## 2013-09-13 DIAGNOSIS — N259 Disorder resulting from impaired renal tubular function, unspecified: Secondary | ICD-10-CM

## 2013-09-13 DIAGNOSIS — N189 Chronic kidney disease, unspecified: Secondary | ICD-10-CM

## 2013-09-13 DIAGNOSIS — N183 Chronic kidney disease, stage 3 unspecified: Secondary | ICD-10-CM

## 2013-09-13 DIAGNOSIS — D631 Anemia in chronic kidney disease: Secondary | ICD-10-CM

## 2013-09-13 DIAGNOSIS — D649 Anemia, unspecified: Secondary | ICD-10-CM

## 2013-09-13 DIAGNOSIS — N039 Chronic nephritic syndrome with unspecified morphologic changes: Secondary | ICD-10-CM

## 2013-09-13 LAB — CBC WITH DIFFERENTIAL/PLATELET
BASO%: 0.5 % (ref 0.0–2.0)
Basophils Absolute: 0 10e3/uL (ref 0.0–0.1)
EOS%: 6.2 % (ref 0.0–7.0)
Eosinophils Absolute: 0.4 10e3/uL (ref 0.0–0.5)
HCT: 30.9 % — ABNORMAL LOW (ref 34.8–46.6)
HGB: 9.8 g/dL — ABNORMAL LOW (ref 11.6–15.9)
LYMPH%: 28.8 % (ref 14.0–49.7)
MCH: 28.1 pg (ref 25.1–34.0)
MCHC: 31.7 g/dL (ref 31.5–36.0)
MCV: 88.5 fL (ref 79.5–101.0)
MONO#: 0.5 10e3/uL (ref 0.1–0.9)
MONO%: 7.6 % (ref 0.0–14.0)
NEUT#: 3.5 10e3/uL (ref 1.5–6.5)
NEUT%: 56.9 % (ref 38.4–76.8)
Platelets: 192 10e3/uL (ref 145–400)
RBC: 3.49 10e6/uL — ABNORMAL LOW (ref 3.70–5.45)
RDW: 14.2 % (ref 11.2–14.5)
WBC: 6.2 10e3/uL (ref 3.9–10.3)
lymph#: 1.8 10e3/uL (ref 0.9–3.3)

## 2013-09-13 MED ORDER — EPOETIN ALFA 40000 UNIT/ML IJ SOLN
40000.0000 [IU] | INTRAMUSCULAR | Status: DC
  Administered 2013-09-13: 40000 [IU] via SUBCUTANEOUS
  Filled 2013-09-13: qty 1

## 2013-10-03 ENCOUNTER — Other Ambulatory Visit: Payer: Self-pay | Admitting: *Deleted

## 2013-10-04 ENCOUNTER — Other Ambulatory Visit: Payer: Medicare PPO

## 2013-10-04 ENCOUNTER — Other Ambulatory Visit (HOSPITAL_BASED_OUTPATIENT_CLINIC_OR_DEPARTMENT_OTHER): Payer: Medicare PPO

## 2013-10-04 ENCOUNTER — Ambulatory Visit (HOSPITAL_BASED_OUTPATIENT_CLINIC_OR_DEPARTMENT_OTHER): Payer: Medicare PPO

## 2013-10-04 ENCOUNTER — Ambulatory Visit: Payer: Medicare PPO

## 2013-10-04 ENCOUNTER — Telehealth: Payer: Self-pay | Admitting: Oncology

## 2013-10-04 ENCOUNTER — Ambulatory Visit (HOSPITAL_BASED_OUTPATIENT_CLINIC_OR_DEPARTMENT_OTHER): Payer: Medicare PPO | Admitting: Nurse Practitioner

## 2013-10-04 VITALS — BP 149/52 | HR 50 | Temp 98.5°F | Resp 20 | Ht 68.0 in | Wt 272.5 lb

## 2013-10-04 DIAGNOSIS — E119 Type 2 diabetes mellitus without complications: Secondary | ICD-10-CM

## 2013-10-04 DIAGNOSIS — N183 Chronic kidney disease, stage 3 unspecified: Secondary | ICD-10-CM

## 2013-10-04 DIAGNOSIS — N039 Chronic nephritic syndrome with unspecified morphologic changes: Secondary | ICD-10-CM

## 2013-10-04 DIAGNOSIS — N259 Disorder resulting from impaired renal tubular function, unspecified: Secondary | ICD-10-CM

## 2013-10-04 DIAGNOSIS — D649 Anemia, unspecified: Secondary | ICD-10-CM

## 2013-10-04 DIAGNOSIS — I10 Essential (primary) hypertension: Secondary | ICD-10-CM

## 2013-10-04 DIAGNOSIS — N189 Chronic kidney disease, unspecified: Secondary | ICD-10-CM

## 2013-10-04 DIAGNOSIS — C259 Malignant neoplasm of pancreas, unspecified: Secondary | ICD-10-CM

## 2013-10-04 DIAGNOSIS — C250 Malignant neoplasm of head of pancreas: Secondary | ICD-10-CM

## 2013-10-04 DIAGNOSIS — D631 Anemia in chronic kidney disease: Secondary | ICD-10-CM

## 2013-10-04 DIAGNOSIS — I509 Heart failure, unspecified: Secondary | ICD-10-CM

## 2013-10-04 LAB — CBC WITH DIFFERENTIAL/PLATELET
BASO%: 0.2 % (ref 0.0–2.0)
Basophils Absolute: 0 10*3/uL (ref 0.0–0.1)
EOS ABS: 0.4 10*3/uL (ref 0.0–0.5)
EOS%: 5.8 % (ref 0.0–7.0)
HEMATOCRIT: 33.4 % — AB (ref 34.8–46.6)
HGB: 10.6 g/dL — ABNORMAL LOW (ref 11.6–15.9)
LYMPH%: 29.8 % (ref 14.0–49.7)
MCH: 27.6 pg (ref 25.1–34.0)
MCHC: 31.7 g/dL (ref 31.5–36.0)
MCV: 87 fL (ref 79.5–101.0)
MONO#: 0.3 10*3/uL (ref 0.1–0.9)
MONO%: 5.5 % (ref 0.0–14.0)
NEUT#: 3.6 10*3/uL (ref 1.5–6.5)
NEUT%: 58.7 % (ref 38.4–76.8)
PLATELETS: 183 10*3/uL (ref 145–400)
RBC: 3.84 10*6/uL (ref 3.70–5.45)
RDW: 14.3 % (ref 11.2–14.5)
WBC: 6 10*3/uL (ref 3.9–10.3)
lymph#: 1.8 10*3/uL (ref 0.9–3.3)
nRBC: 0 % (ref 0–0)

## 2013-10-04 MED ORDER — EPOETIN ALFA 40000 UNIT/ML IJ SOLN
40000.0000 [IU] | INTRAMUSCULAR | Status: DC
  Administered 2013-10-04: 40000 [IU] via SUBCUTANEOUS
  Filled 2013-10-04: qty 1

## 2013-10-04 NOTE — Telephone Encounter (Signed)
[facility] APPT SCHEDULE FOR OCT THRU DEC.

## 2013-10-04 NOTE — Progress Notes (Addendum)
  [facility] OFFICE PROGRESS NOTE   Diagnosis:  Pancreas cancer, anemia.  INTERVAL HISTORY:   Ms. Kain returns after missing a followup visit in July. She denies pain. No nausea or vomiting. No diarrhea. She has had some problems with constipation. She is currently taking Colace and MiraLAX every other day. Appetite is poor. No change in baseline dyspnea on exertion.  Objective:  Vital signs in last 24 hours:  Blood pressure 149/52, pulse 50, temperature 98.5 F (36.9 C), temperature source Oral, resp. rate 20, height 5\' 8"  (1.727 m), weight 272 lb 8 oz (123.605 kg).    HEENT: No thrush or ulcers. Lymphatics: Approximate 4 cm medial left inguinal lymph node. 1 cm right inguinal lymph node. Resp: Breath sounds diminished globally with scattered faint wheezes. No respiratory distress. Cardio: Regular rate and rhythm. GI: Abdomen soft and nontender. No hepatomegaly. No mass. Vascular: Chronic stasis changes at the lower legs bilaterally.   Lab Results:  Lab Results  Component Value Date   WBC 6.2 09/13/2013   HGB 9.8* 09/13/2013   HCT 30.9* 09/13/2013   MCV 88.5 09/13/2013   PLT 192 09/13/2013   NEUTROABS 3.5 09/13/2013    Imaging:  No results found.  Medications: I have reviewed the patient's current medications.  Assessment/Plan: 1. Pancreas cancer. Abdominal ultrasound 06/21/2013 with intrahepatic and extra hepatic biliary ductal dilatation. Pancreatic duct borderline dilated.  MRCP 06/26/2013 with severe intra and extrahepatic biliary ductal dilatation. The common bile duct abruptly terminated in the region of the superior aspect of the pancreatic head where there was a masslike area of amorphous soft tissue.  Endoscopic ultrasound 06/29/2013 with significant intra-and extrahepatic biliary ductal dilatation, pancreatic head mass that seemed to invade the portal confluence and superior mesenteric vein. A few scattered malignant appearing peripancreatic lymph nodes.  EUS staging T3 N1 Mx.  Biopsy of the pancreas head mass showed malignant cells consistent with adenocarcinoma.  Staging PET scan with a hypermetabolic pancreas head mass,. Precordial nodule, and enlarged hypermetabolic left inguinal node 2. 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 3. Hypertension. 4. Diabetes. 5. Heart failure. 6. Chronic kidney disease. 7. Anemia. 8. Hypothyroid. 9. Sleep apnea. 10. Asthma.   Disposition: Ms. Valade appears unchanged. She is being followed on a supportive care approach with regard to the pancreas cancer. She prefers to receive oncology followup with Dr. Earnestine Mealing at [facility]. She understands that we are available should she have any needs between followup visits at [facility].  She will continue Procrit on a 3 week schedule through our office. We will arrange for a followup visit in 9 weeks. She will contact the office in the interim with any problems.  Patient seen with Dr. Benay Spice.    Ned Card ANP/GNP-BC   10/04/2013  11:29 AM  This was a shared visit with Ned Card. Ms. Tabone is being followed at [facility] for management of the pancreas cancer. We told her we would be glad to see her here at any time.  The plan is to continue Procrit for treatment of anemia related to renal failure.  Julieanne Manson, M.D.

## 2013-10-17 ENCOUNTER — Other Ambulatory Visit: Payer: Self-pay | Admitting: *Deleted

## 2013-10-17 MED ORDER — FENOFIBRATE MICRONIZED 43 MG PO CAPS: 43.0000 mg | ORAL_CAPSULE | Freq: Every day | ORAL | Status: DC

## 2013-10-17 MED ORDER — ALLOPURINOL 100 MG PO TABS: 100.0000 mg | ORAL_TABLET | Freq: Every day | ORAL | Status: DC

## 2013-10-25 ENCOUNTER — Other Ambulatory Visit (HOSPITAL_BASED_OUTPATIENT_CLINIC_OR_DEPARTMENT_OTHER): Payer: Medicare PPO

## 2013-10-25 ENCOUNTER — Ambulatory Visit: Payer: Medicare PPO

## 2013-10-25 DIAGNOSIS — D649 Anemia, unspecified: Secondary | ICD-10-CM

## 2013-10-25 DIAGNOSIS — D631 Anemia in chronic kidney disease: Secondary | ICD-10-CM | POA: Diagnosis not present

## 2013-10-25 DIAGNOSIS — N189 Chronic kidney disease, unspecified: Secondary | ICD-10-CM | POA: Diagnosis not present

## 2013-10-25 DIAGNOSIS — N259 Disorder resulting from impaired renal tubular function, unspecified: Secondary | ICD-10-CM

## 2013-10-25 DIAGNOSIS — N183 Chronic kidney disease, stage 3 unspecified: Secondary | ICD-10-CM

## 2013-10-25 LAB — CBC WITH DIFFERENTIAL/PLATELET
BASO%: 0.2 % (ref 0.0–2.0)
BASOS ABS: 0 10*3/uL (ref 0.0–0.1)
EOS%: 0.1 % (ref 0.0–7.0)
Eosinophils Absolute: 0 10*3/uL (ref 0.0–0.5)
HEMATOCRIT: 36 % (ref 34.8–46.6)
HGB: 11.5 g/dL — ABNORMAL LOW (ref 11.6–15.9)
LYMPH%: 21.3 % (ref 14.0–49.7)
MCH: 26.8 pg (ref 25.1–34.0)
MCHC: 31.9 g/dL (ref 31.5–36.0)
MCV: 83.9 fL (ref 79.5–101.0)
MONO#: 0.5 10*3/uL (ref 0.1–0.9)
MONO%: 5.3 % (ref 0.0–14.0)
NEUT#: 6.4 10*3/uL (ref 1.5–6.5)
NEUT%: 73.1 % (ref 38.4–76.8)
PLATELETS: 195 10*3/uL (ref 145–400)
RBC: 4.29 10*6/uL (ref 3.70–5.45)
RDW: 14.7 % — ABNORMAL HIGH (ref 11.2–14.5)
WBC: 8.8 10*3/uL (ref 3.9–10.3)
lymph#: 1.9 10*3/uL (ref 0.9–3.3)
nRBC: 0 % (ref 0–0)

## 2013-10-25 NOTE — Progress Notes (Signed)
Patient hgb: 11.5 today.  No procrit given. Patient aware of lab results.

## 2013-11-07 ENCOUNTER — Other Ambulatory Visit: Payer: Self-pay | Admitting: *Deleted

## 2013-11-07 MED ORDER — INSULIN ASPART 100 UNIT/ML ~~LOC~~ SOLN: 4.0000 [IU] | Freq: Three times a day (TID) | SUBCUTANEOUS | Status: DC

## 2013-11-15 ENCOUNTER — Ambulatory Visit (HOSPITAL_BASED_OUTPATIENT_CLINIC_OR_DEPARTMENT_OTHER): Payer: Medicare PPO

## 2013-11-15 ENCOUNTER — Other Ambulatory Visit (HOSPITAL_BASED_OUTPATIENT_CLINIC_OR_DEPARTMENT_OTHER)

## 2013-11-15 DIAGNOSIS — D631 Anemia in chronic kidney disease: Secondary | ICD-10-CM | POA: Diagnosis not present

## 2013-11-15 DIAGNOSIS — N183 Chronic kidney disease, stage 3 unspecified: Secondary | ICD-10-CM

## 2013-11-15 DIAGNOSIS — N189 Chronic kidney disease, unspecified: Secondary | ICD-10-CM

## 2013-11-15 DIAGNOSIS — N259 Disorder resulting from impaired renal tubular function, unspecified: Secondary | ICD-10-CM

## 2013-11-15 DIAGNOSIS — D649 Anemia, unspecified: Secondary | ICD-10-CM

## 2013-11-15 LAB — CBC WITH DIFFERENTIAL/PLATELET
BASO%: 0.5 % (ref 0.0–2.0)
Basophils Absolute: 0 10*3/uL (ref 0.0–0.1)
EOS ABS: 0.2 10*3/uL (ref 0.0–0.5)
EOS%: 4.4 % (ref 0.0–7.0)
HEMATOCRIT: 31.9 % — AB (ref 34.8–46.6)
HEMOGLOBIN: 10 g/dL — AB (ref 11.6–15.9)
LYMPH%: 33.6 % (ref 14.0–49.7)
MCH: 26.7 pg (ref 25.1–34.0)
MCHC: 31.3 g/dL — ABNORMAL LOW (ref 31.5–36.0)
MCV: 85.3 fL (ref 79.5–101.0)
MONO#: 0.3 10*3/uL (ref 0.1–0.9)
MONO%: 6.2 % (ref 0.0–14.0)
NEUT%: 55.3 % (ref 38.4–76.8)
NEUTROS ABS: 3 10*3/uL (ref 1.5–6.5)
Platelets: 219 10*3/uL (ref 145–400)
RBC: 3.74 10*6/uL (ref 3.70–5.45)
RDW: 14.9 % — AB (ref 11.2–14.5)
WBC: 5.5 10*3/uL (ref 3.9–10.3)
lymph#: 1.9 10*3/uL (ref 0.9–3.3)
nRBC: 0 % (ref 0–0)

## 2013-11-15 MED ORDER — EPOETIN ALFA 40000 UNIT/ML IJ SOLN
40000.0000 [IU] | INTRAMUSCULAR | Status: DC
  Administered 2013-11-15: 40000 [IU] via SUBCUTANEOUS
  Filled 2013-11-15: qty 1

## 2013-11-15 NOTE — Patient Instructions (Signed)

## 2013-11-22 ENCOUNTER — Ambulatory Visit (INDEPENDENT_AMBULATORY_CARE_PROVIDER_SITE_OTHER): Payer: Medicare PPO | Admitting: Podiatry

## 2013-11-22 DIAGNOSIS — Q828 Other specified congenital malformations of skin: Secondary | ICD-10-CM

## 2013-11-22 DIAGNOSIS — M79676 Pain in unspecified toe(s): Secondary | ICD-10-CM

## 2013-11-22 DIAGNOSIS — B351 Tinea unguium: Secondary | ICD-10-CM

## 2013-11-22 NOTE — Progress Notes (Signed)
Presents today chief complaint of painful elongated toenails.  Objective: Pulses are palpable bilateral nails are thick, yellow dystrophic onychomycosis and painful palpation. Painful reactive hyperkeratosis/porokeratosis plantar aspect fifth metatarsal and first metatarsal heads bilateral. No ulcerations no open lesions. No signs of infection.  Assessment: Onychomycosis with pain in limb.porokeratotic lesions plantar aspect of the bilateral foot.  Plan: Treatment of nails in thickness and length as covered service secondary to pain.debrided all porokeratotic lesions.

## 2013-11-23 ENCOUNTER — Ambulatory Visit (INDEPENDENT_AMBULATORY_CARE_PROVIDER_SITE_OTHER): Payer: Medicare PPO | Admitting: Endocrinology

## 2013-11-23 ENCOUNTER — Encounter: Payer: Self-pay | Admitting: Endocrinology

## 2013-11-23 VITALS — BP 138/82 | HR 58 | Temp 98.6°F | Resp 16 | Ht 68.0 in | Wt 271.4 lb

## 2013-11-23 DIAGNOSIS — N183 Chronic kidney disease, stage 3 unspecified: Secondary | ICD-10-CM

## 2013-11-23 DIAGNOSIS — E1165 Type 2 diabetes mellitus with hyperglycemia: Secondary | ICD-10-CM

## 2013-11-23 DIAGNOSIS — IMO0002 Reserved for concepts with insufficient information to code with codable children: Secondary | ICD-10-CM

## 2013-11-23 NOTE — Progress Notes (Signed)
Patient ID: [name], female   DOB: 06-Dec-1932, 80 y.o.   MRN: [id number]   Reason for Appointment: Diabetes follow-up    History of Present Illness   Diagnosis: Type 2 DIABETES MELITUS, date of diagnosis:  1997  Past history: She was started on insulin within a year of diagnosis; had diarrhea from metformin and swelling from Actos Generally her blood sugars are fairly well-controlled with basal bolus insulin regimen and A1c mostly below 7 although this is usually not representative of her actual blood sugars because of her renal dysfunction and anemia  RECENT history:  Insulin regimen: Lantus 38  units a.m., 12  units PM and NovoLog  04-11-16 a.c.         She has had relatively lower blood sugars over the last few months Her insulin dose was reduced somewhat on her last visit but she is still getting tendency to hypoglycemia overnight and fasting especially about 2-3 weeks ago Also has had occasional low sugars in the evenings but did not call to report this She is still taking Lantus twice a day and NovoLog with every meal Her appetite is still relatively low although her weight has gone up She is fairly compliant with taking  her insulin as directed before meals and Lantus twice a day  Hypoglycemia: Blood sugars have been periodically in the 50s and 60s at all times of the day except right after dinner Proper timing of medications in relation to meals: Yes.         Monitors blood glucose:  1.6 times a day.    Glucometer:  recently Accu-Chek with the following download:  PREMEAL Breakfast Lunch Dinner Bedtime Overall  Glucose range:  55-129   54-115   56-192   58-206   49-206   Median   95     97      Meals:  2-3 meals per day.  breakfast: oatmeal, fruit. Supper 7 pm   Physical activity:  unable to do much            Dietician visit: Most recent: 8/29          Complications: are: ? Nephropathy, last microalbumin/ creatinine ratio was 87 in 2/14     Wt Readings from Last 3  Encounters:  11/23/13 271 lb 6.4 oz (123.106 kg)  10/04/13 272 lb 8 oz (123.605 kg)  08/31/13 265 lb 12.8 oz (120.566 kg)   Lab Results  Component Value Date   HGBA1C 4.6 08/29/2013   HGBA1C 5.4 05/31/2013   HGBA1C 5.8 02/07/2013   Lab Results  Component Value Date   MICROALBUR 209.5* 05/31/2013   LDLCALC 130* 05/31/2013   CREATININE 1.7* 08/29/2013         Medication List       This list is accurate as of: 11/23/13  3:09 PM.  Always use your most recent med list.               ACCU-CHEK AVIVA PLUS W/DEVICE Kit  Use to check blood sugar 3 times per day dx code 250.02     ACCU-CHEK FASTCLIX LANCETS Misc  Use to obtain blood specimen 3 times per day     ADVAIR DISKUS 250-50 MCG/DOSE Aepb  Generic drug:  Fluticasone-Salmeterol  Inhale 1 puff into the lungs every 12 (twelve) hours.     albuterol 108 (90 BASE) MCG/ACT inhaler  Commonly known as:  PROVENTIL HFA;VENTOLIN HFA  Inhale 2 puffs into the lungs every 6 (six) hours as  needed for wheezing or shortness of breath.     allopurinol 100 MG tablet  Commonly known as:  ZYLOPRIM  Take 1 tablet (100 mg total) by mouth daily.     aspirin EC 81 MG tablet  Take 81 mg by mouth daily.     cholestyramine 4 G packet  Commonly known as:  QUESTRAN  Take 4 g by mouth 3 (three) times daily with meals.     diltiazem 300 MG 24 hr capsule  Commonly known as:  CARDIZEM CD  Take 300 mg by mouth at bedtime.     docusate sodium 100 MG capsule  Commonly known as:  COLACE  Take 100 mg by mouth 2 (two) times daily as needed for mild constipation.     ergocalciferol 50000 UNITS capsule  Commonly known as:  VITAMIN D2  Take 1 capsule (50,000 Units total) by mouth once a week.     fenofibrate micronized 43 MG capsule  Commonly known as:  ANTARA  Take 1 capsule (43 mg total) by mouth daily before breakfast.     ferrous sulfate 325 (65 FE) MG tablet  Take 325 mg by mouth daily.     furosemide 80 MG tablet  Commonly known  as:  LASIX  Take 80 mg by mouth daily.     glucose blood test strip  Commonly known as:  ACCU-CHEK AVIVA PLUS  Use as instructed to check blood sugar 3 times per day dx code 250.02     insulin aspart 100 UNIT/ML injection  Commonly known as:  novoLOG  Inject 4-18 Units into the skin 3 (three) times daily with meals. 4 units before breakfast, 6 units before lunch, 18 units before supper     insulin glargine 100 UNIT/ML injection  Commonly known as:  LANTUS  Inject 18-40 Units into the skin 2 (two) times daily. 38 units Qam, 12 units before supper     Insulin Syringe-Needle U-100 31G X 5/16" 1 ML Misc  Commonly known as:  B-D INS SYR ULTRAFINE 1CC/31G  Inject 1 each into the skin 3 (three) times daily.     levothyroxine 25 MCG tablet  Commonly known as:  SYNTHROID, LEVOTHROID  Take 25 mcg by mouth at bedtime.     losartan 100 MG tablet  Commonly known as:  COZAAR  Take 100 mg by mouth every morning.     montelukast 10 MG tablet  Commonly known as:  SINGULAIR  Take 10 mg by mouth at bedtime.     omeprazole 20 MG capsule  Commonly known as:  PRILOSEC  Take 20 mg by mouth every morning.     polyethylene glycol packet  Commonly known as:  MIRALAX / GLYCOLAX  Take 17 g by mouth daily as needed for mild constipation.     pravastatin 20 MG tablet  Commonly known as:  PRAVACHOL  Take 20 mg by mouth daily.     PROCRIT IJ  Inject 40,000 Units as directed every 21 ( twenty-one) days.        Allergies:  Allergies  Allergen Reactions  . Percocet [Oxycodone-Acetaminophen] Nausea And Vomiting  . Codeine     Felt like "my head was going to explode"  . Fish Allergy Itching  . Iodine Itching  . Zantac [Ranitidine Hcl] Nausea And Vomiting    Past Medical History  Diagnosis Date  . Hypertension   . Heart failure     sees dr Iona Coach at [facility] every 6 months  . Asthma   .  Diabetes mellitus, type 2   . Chronic kidney disease     sees dr Lorrene Reid nephrology every 4 months   . Anemia     on iron  . Sleep apnea     cpap setting of 2  . Cancer     pancreatic     Past Surgical History  Procedure Laterality Date  . Cholecystectomy  1973  . Total abdominal hysterectomy  1982  . Tubal ligation  1973  . Breast reduction surgery  2002  . Eus N/A 06/29/2013    Procedure: ESOPHAGEAL ENDOSCOPIC ULTRASOUND (EUS) RADIAL;  Surgeon: Arta Silence, MD;  Location: [facility];  Service: Endoscopy;  Laterality: N/A;  . Ercp N/A 06/29/2013    Procedure: ENDOSCOPIC RETROGRADE CHOLANGIOPANCREATOGRAPHY (ERCP);  Surgeon: Arta Silence, MD;  Location: [facility];  Service: Endoscopy;  Laterality: N/A;    Family History  Problem Relation Age of Onset  . Asthma Sister   . Asthma Mother   . Heart disease Father   . Cancer Sister     breast cancer  . Cancer Brother     kidney    Social History:  reports that she quit smoking about 49 years ago. Her smoking use included Cigarettes. She has a 2.7 pack-year smoking history. She has never used smokeless tobacco. She reports that she does not drink alcohol or use illicit drugs.  Review of Systems:  She has  been diagnosed to have pancreatic cancer and is opting for no treatment, no recent abdominal pain   HYPERTENSION:  long-standing and well controlled with current regimen   HYPOTHYROID: TSH has been normal with taking small dose  of 25 mcg levothyroxine. She is asking about taking Armour Thyroid because she continues to be somewhat fatigued  Lab Results  Component Value Date   TSH 1.75 05/31/2013    Chronic kidney disease: Followed by nephrologist periodically   Lab Results  Component Value Date   CREATININE 1.7* 08/29/2013     Continues to get Aranesp for her anemia anaemia followed by a hematologist  Lab Results  Component Value Date   WBC 5.5 11/15/2013   HGB 10.0* 11/15/2013   HCT 31.9* 11/15/2013   MCV 85.3 11/15/2013   PLT 219 11/15/2013     HYPERLIPIDEMIA: The lipid abnormality consists  mostly of high triglycerides and also high LDL She is on low-dose fenofibrate and also pravastatin 20 mg    Lab Results  Component Value Date   CHOL 203* 05/31/2013   HDL 30.70* 05/31/2013   LDLCALC 130* 05/31/2013   TRIG 212.0* 05/31/2013   CHOLHDL 7 05/31/2013        Examination:   BP 138/82 mmHg  Pulse 58  Temp(Src) 98.6 F (37 C)  Resp 16  Ht _0  (1.727 m)  Wt 271 lb 6.4 oz (123.106 kg)  BMI 41.28 kg/m2  SpO2 98%  Body mass index is 41.28 kg/(m^2).     ASSESSMENT/ PLAN:   Diabetes type 2   The patient's glucose readings are relatively lower because of her decreased appetite in the last few months  Although she has not had much hypoglycemia and last week or so she has had excessive hypoglycemia before that Discussed that we do not need to get  her blood sugars tightly controlled because of her underlying malignancy Discussed getting blood sugars around 150 on an average without overnight hypoglycemia Most likely she will need reduced amounts of mealtime coverage Also she will stop her evening Lantus and also morning NovoLog  now She will call if she has excessive variability in her blood sugars Also discussed that A1c will not be helpful at this point She can be little more liberal in her diet    HYPERTENSION: Well controlled  Patient Instructions  Lantus 38  units a.m.  and NovoLog  lunch 6- supper 14 a.c.  Call if sugars low again      [facility] 11/23/2013, 3:09 PM

## 2013-11-23 NOTE — Patient Instructions (Addendum)
Lantus 38  units a.m.  and NovoLog  lunch 6- supper 14 a.c.  Call if sugars low again

## 2013-12-06 ENCOUNTER — Ambulatory Visit (HOSPITAL_BASED_OUTPATIENT_CLINIC_OR_DEPARTMENT_OTHER): Payer: Medicare PPO | Admitting: Oncology

## 2013-12-06 ENCOUNTER — Ambulatory Visit (HOSPITAL_BASED_OUTPATIENT_CLINIC_OR_DEPARTMENT_OTHER): Payer: Medicare PPO

## 2013-12-06 ENCOUNTER — Other Ambulatory Visit (HOSPITAL_BASED_OUTPATIENT_CLINIC_OR_DEPARTMENT_OTHER): Payer: Medicare PPO

## 2013-12-06 VITALS — BP 160/72 | HR 56 | Temp 98.6°F | Resp 18 | Ht 68.0 in | Wt 272.4 lb

## 2013-12-06 DIAGNOSIS — D649 Anemia, unspecified: Secondary | ICD-10-CM

## 2013-12-06 DIAGNOSIS — E119 Type 2 diabetes mellitus without complications: Secondary | ICD-10-CM

## 2013-12-06 DIAGNOSIS — N259 Disorder resulting from impaired renal tubular function, unspecified: Secondary | ICD-10-CM

## 2013-12-06 DIAGNOSIS — N183 Chronic kidney disease, stage 3 unspecified: Secondary | ICD-10-CM

## 2013-12-06 DIAGNOSIS — D631 Anemia in chronic kidney disease: Secondary | ICD-10-CM

## 2013-12-06 DIAGNOSIS — N189 Chronic kidney disease, unspecified: Secondary | ICD-10-CM

## 2013-12-06 DIAGNOSIS — N185 Chronic kidney disease, stage 5: Secondary | ICD-10-CM

## 2013-12-06 DIAGNOSIS — E039 Hypothyroidism, unspecified: Secondary | ICD-10-CM

## 2013-12-06 LAB — CBC WITH DIFFERENTIAL/PLATELET
BASO%: 0.3 % (ref 0.0–2.0)
Basophils Absolute: 0 10*3/uL (ref 0.0–0.1)
EOS ABS: 0.3 10*3/uL (ref 0.0–0.5)
EOS%: 5.4 % (ref 0.0–7.0)
HCT: 33.1 % — ABNORMAL LOW (ref 34.8–46.6)
HGB: 10.5 g/dL — ABNORMAL LOW (ref 11.6–15.9)
LYMPH%: 26.5 % (ref 14.0–49.7)
MCH: 26.8 pg (ref 25.1–34.0)
MCHC: 31.7 g/dL (ref 31.5–36.0)
MCV: 84.4 fL (ref 79.5–101.0)
MONO#: 0.4 10*3/uL (ref 0.1–0.9)
MONO%: 7 % (ref 0.0–14.0)
NEUT%: 60.8 % (ref 38.4–76.8)
NEUTROS ABS: 3.7 10*3/uL (ref 1.5–6.5)
PLATELETS: 182 10*3/uL (ref 145–400)
RBC: 3.92 10*6/uL (ref 3.70–5.45)
RDW: 15.4 % — ABNORMAL HIGH (ref 11.2–14.5)
WBC: 6.1 10*3/uL (ref 3.9–10.3)
lymph#: 1.6 10*3/uL (ref 0.9–3.3)
nRBC: 0 % (ref 0–0)

## 2013-12-06 MED ORDER — EPOETIN ALFA 40000 UNIT/ML IJ SOLN
40000.0000 [IU] | INTRAMUSCULAR | Status: DC
  Administered 2013-12-06: 40000 [IU] via SUBCUTANEOUS
  Filled 2013-12-06: qty 1

## 2013-12-06 NOTE — Progress Notes (Signed)
  [name]   Diagnosis: Anemia, pancreas cancer  INTERVAL HISTORY:   [name] returns as scheduled. She continues every three-week Procrit therapy. She relates constipation to iron. She has been taking Miralax every other day in addition to Senokot. She continues follow-up at [facility] for the pancreas cancer. She decided against treatment of the pancreas cancer. She has anorexia. She reports bilateral leg pain.  Objective:  Vital signs in last 24 hours:  Blood pressure 160/72, pulse 56, temperature 98.6 F (37 C), temperature source Oral, resp. rate 18, height 5\' 8"  (1.727 m), weight 272 lb 6.4 oz (123.56 kg).    Lymphatics: Firm 3-4 cm left inguinal/femoral node Resp: Decreased breath sounds in the bases, no respiratory distress Cardio: Regular rate and rhythm GI: No hepatomegaly, no mass, no apparent ascites Vascular: Chronic stasis change at the lower leg bilaterally   Lab Results:  Lab Results  Component Value Date   WBC 6.1 12/06/2013   HGB 10.5* 12/06/2013   HCT 33.1* 12/06/2013   MCV 84.4 12/06/2013   PLT 182 12/06/2013   NEUTROABS 3.7 12/06/2013     Medications: I have reviewed the patient's current medications.  Assessment/Plan: 1. Pancreas cancer.  Abdominal ultrasound 06/21/2013 with intrahepatic and extra hepatic biliary ductal dilatation. Pancreatic duct borderline dilated.   MRCP 06/26/2013 with severe intra and extrahepatic biliary ductal dilatation. The common bile duct abruptly terminated in the region of the superior aspect of the pancreatic head where there was a masslike area of amorphous soft tissue.   Endoscopic ultrasound 06/29/2013 with significant intra-and extrahepatic biliary ductal dilatation, pancreatic head mass that seemed to invade the portal confluence and superior mesenteric vein. A few scattered malignant appearing peripancreatic lymph nodes. EUS staging T3 N1 Mx.   Biopsy of the pancreas head  mass showed malignant cells consistent with adenocarcinoma.   Staging PET scan with a hypermetabolic pancreas head mass,. Precordial nodule, and enlarged hypermetabolic left inguinal node 2. 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 3. Hypertension. 4. Diabetes. 5. Heart failure. 6. Chronic kidney disease. 7. Anemia. 8. Hypothyroid. 9. Sleep apnea. 10. Asthma.   Disposition:  She appears stable. She will continue every 3 week Procrit for anemia secondary to renal failure.  [name] will continue follow-up with Dr. Earnestine Mealing for management of the pancreas cancer.  She will take daily Miralax and try twice daily Senokot for the constipation.  She will be scheduled for an office visit in approximately 3 months.   Betsy Coder, MD  12/06/2013  1:22 PM

## 2013-12-06 NOTE — Patient Instructions (Signed)

## 2013-12-07 ENCOUNTER — Telehealth: Payer: Self-pay | Admitting: Oncology

## 2013-12-07 NOTE — Telephone Encounter (Signed)
Lft msg labs/inj/ov per 12/01 POF, mailed pt schedule..... KJ

## 2013-12-12 ENCOUNTER — Other Ambulatory Visit: Payer: Self-pay | Admitting: *Deleted

## 2013-12-12 MED ORDER — LEVOTHYROXINE SODIUM 25 MCG PO TABS: 25.0000 ug | ORAL_TABLET | Freq: Every day | ORAL | Status: DC

## 2013-12-22 ENCOUNTER — Other Ambulatory Visit: Payer: Self-pay | Admitting: Nurse Practitioner

## 2013-12-27 ENCOUNTER — Ambulatory Visit (HOSPITAL_BASED_OUTPATIENT_CLINIC_OR_DEPARTMENT_OTHER): Payer: Medicare PPO

## 2013-12-27 ENCOUNTER — Ambulatory Visit (HOSPITAL_BASED_OUTPATIENT_CLINIC_OR_DEPARTMENT_OTHER): Payer: Medicare PPO | Admitting: Lab

## 2013-12-27 DIAGNOSIS — D649 Anemia, unspecified: Secondary | ICD-10-CM

## 2013-12-27 DIAGNOSIS — N183 Chronic kidney disease, stage 3 unspecified: Secondary | ICD-10-CM

## 2013-12-27 DIAGNOSIS — D631 Anemia in chronic kidney disease: Secondary | ICD-10-CM

## 2013-12-27 LAB — CBC WITH DIFFERENTIAL/PLATELET
BASO%: 0.6 % (ref 0.0–2.0)
Basophils Absolute: 0 10*3/uL (ref 0.0–0.1)
EOS%: 4.9 % (ref 0.0–7.0)
Eosinophils Absolute: 0.3 10*3/uL (ref 0.0–0.5)
HCT: 34.5 % — ABNORMAL LOW (ref 34.8–46.6)
HGB: 10.8 g/dL — ABNORMAL LOW (ref 11.6–15.9)
LYMPH#: 1.8 10*3/uL (ref 0.9–3.3)
LYMPH%: 27.7 % (ref 14.0–49.7)
MCH: 26.8 pg (ref 25.1–34.0)
MCHC: 31.3 g/dL — ABNORMAL LOW (ref 31.5–36.0)
MCV: 85.6 fL (ref 79.5–101.0)
MONO#: 0.4 10*3/uL (ref 0.1–0.9)
MONO%: 6.6 % (ref 0.0–14.0)
NEUT#: 3.9 10*3/uL (ref 1.5–6.5)
NEUT%: 60.2 % (ref 38.4–76.8)
NRBC: 0 % (ref 0–0)
Platelets: 201 10*3/uL (ref 145–400)
RBC: 4.03 10*6/uL (ref 3.70–5.45)
RDW: 15.2 % — ABNORMAL HIGH (ref 11.2–14.5)
WBC: 6.5 10*3/uL (ref 3.9–10.3)

## 2013-12-27 MED ORDER — EPOETIN ALFA 40000 UNIT/ML IJ SOLN
40000.0000 [IU] | INTRAMUSCULAR | Status: DC
  Administered 2013-12-27: 40000 [IU] via SUBCUTANEOUS
  Filled 2013-12-27: qty 1

## 2013-12-27 NOTE — Patient Instructions (Signed)

## 2013-12-27 NOTE — Progress Notes (Signed)
Per Ned Card, NP OK to give procrit 40,000 units to pt with HGB 10.8 and BP 168/70.

## 2014-01-09 ENCOUNTER — Other Ambulatory Visit: Payer: Self-pay | Admitting: *Deleted

## 2014-01-09 MED ORDER — ERGOCALCIFEROL 1.25 MG (50000 UT) PO CAPS: 50000.0000 [IU] | ORAL_CAPSULE | ORAL | Status: DC

## 2014-01-12 ENCOUNTER — Other Ambulatory Visit: Payer: Self-pay | Admitting: *Deleted

## 2014-01-12 MED ORDER — GLUCOSE BLOOD VI STRP: ORAL_STRIP | Status: AC

## 2014-01-17 ENCOUNTER — Other Ambulatory Visit: Payer: Medicare PPO

## 2014-01-17 ENCOUNTER — Ambulatory Visit: Payer: Medicare PPO

## 2014-01-18 ENCOUNTER — Other Ambulatory Visit (INDEPENDENT_AMBULATORY_CARE_PROVIDER_SITE_OTHER): Payer: Medicare PPO

## 2014-01-18 DIAGNOSIS — E785 Hyperlipidemia, unspecified: Secondary | ICD-10-CM

## 2014-01-18 DIAGNOSIS — E1165 Type 2 diabetes mellitus with hyperglycemia: Secondary | ICD-10-CM

## 2014-01-18 DIAGNOSIS — IMO0002 Reserved for concepts with insufficient information to code with codable children: Secondary | ICD-10-CM

## 2014-01-18 LAB — BASIC METABOLIC PANEL
BUN: 39 mg/dL — ABNORMAL HIGH (ref 6–23)
CALCIUM: 9.2 mg/dL (ref 8.4–10.5)
CHLORIDE: 105 meq/L (ref 96–112)
CO2: 28 mEq/L (ref 19–32)
CREATININE: 1.69 mg/dL — AB (ref 0.40–1.20)
GFR: 37.5 mL/min — ABNORMAL LOW (ref >60.00)
GLUCOSE: 155 mg/dL — AB (ref 70–99)
Potassium: 4.3 mEq/L (ref 3.5–5.1)
Sodium: 139 mEq/L (ref 135–145)

## 2014-01-18 LAB — HEMOGLOBIN A1C: HEMOGLOBIN A1C: 6 % (ref 4.6–6.5)

## 2014-01-23 ENCOUNTER — Ambulatory Visit (INDEPENDENT_AMBULATORY_CARE_PROVIDER_SITE_OTHER): Payer: Medicare PPO | Admitting: Endocrinology

## 2014-01-23 ENCOUNTER — Encounter: Payer: Self-pay | Admitting: Endocrinology

## 2014-01-23 VITALS — BP 138/64 | HR 54 | Temp 98.1°F | Resp 16 | Ht 68.0 in | Wt 265.8 lb

## 2014-01-23 DIAGNOSIS — E1165 Type 2 diabetes mellitus with hyperglycemia: Secondary | ICD-10-CM

## 2014-01-23 DIAGNOSIS — IMO0002 Reserved for concepts with insufficient information to code with codable children: Secondary | ICD-10-CM

## 2014-01-23 NOTE — Patient Instructions (Signed)
Lantus 34  units a.m. and NovoLog 4-6 at lunch and 8-10  at supper based on meal size and take right after meal

## 2014-01-23 NOTE — Progress Notes (Signed)
Patient ID: [name], female   DOB: Apr 06, 1932, 81 y.o.   MRN: [id number]   Reason for Appointment: Diabetes follow-up    History of Present Illness   Diagnosis: Type 2 DIABETES MELITUS, date of diagnosis:  1997  Past history: She was started on insulin within a year of diagnosis; had diarrhea from metformin and swelling from Actos Generally her blood sugars are fairly well-controlled with basal bolus insulin regimen and A1c mostly below 7 although this is usually not representative of her actual blood sugars because of her renal dysfunction and anemia  RECENT history:  Insulin regimen: Lantus 38  units a.m. and NovoLog  6-14 a.c.         She has had relatively lower blood sugars over the last few months related to her decreased appetite from her pancreatic tumor Her insulin dose was reduced further on her last visit and evening Lantus was stopped She hasn't checked her sugars about once a day on average with some readings in the mornings and some at suppertime and usually not at other times. She still has sporadic low blood sugars primarily in the evenings after 7 PM but usually when she is not eating as much She is taking her NovoLog before eating consistently She is not taking any NovoLog for breakfast at this time and her glucose after breakfast on the lab was fairly good at 155 Has only sporadic high readings in the morning or evening based on her diet  Hypoglycemia: Blood sugars have been periodically in the 50s and 60s at all times of the day except right after dinner Proper timing of medications in relation to meals: Yes.         Monitors blood glucose:  1 times a day.    Glucometer:  recently Accu-Chek with the following download:  PRE-MEAL Breakfast Lunch Dinner  PCS  Overall  Glucose range: 69-163   52-140   50-244    Mean/median:  115     109    Meals:  2-3 meals per day.  breakfast: oatmeal, fruit usually. Supper 7 pm   Physical activity:  unable to do much             Dietician visit: Most recent: 8/52          Complications: are: ? Nephropathy, last microalbumin/ creatinine ratio was 87 in 2/14     Wt Readings from Last 3 Encounters:  01/23/14 265 lb 12.8 oz (120.566 kg)  12/06/13 272 lb 6.4 oz (123.56 kg)  11/23/13 271 lb 6.4 oz (123.106 kg)   Lab Results  Component Value Date   HGBA1C 6.0 01/18/2014   HGBA1C 4.6 08/29/2013   HGBA1C 5.4 05/31/2013   Lab Results  Component Value Date   MICROALBUR 209.5* 05/31/2013   LDLCALC 130* 05/31/2013   CREATININE 1.69* 01/18/2014         Medication List       This list is accurate as of: 01/23/14  3:33 PM.  Always use your most recent med list.               ACCU-CHEK AVIVA PLUS W/DEVICE Kit  Use to check blood sugar 3 times per day dx code 250.02     ACCU-CHEK FASTCLIX LANCETS Misc  Use to obtain blood specimen 3 times per day     ADVAIR DISKUS 250-50 MCG/DOSE Aepb  Generic drug:  Fluticasone-Salmeterol  Inhale 1 puff into the lungs every 12 (twelve) hours.     albuterol  108 (90 BASE) MCG/ACT inhaler  Commonly known as:  PROVENTIL HFA;VENTOLIN HFA  Inhale 2 puffs into the lungs every 6 (six) hours as needed for wheezing or shortness of breath.     allopurinol 100 MG tablet  Commonly known as:  ZYLOPRIM  Take 1 tablet (100 mg total) by mouth daily.     aspirin EC 81 MG tablet  Take 81 mg by mouth daily.     cholestyramine 4 G packet  Commonly known as:  QUESTRAN  Take 4 g by mouth 3 (three) times daily with meals.     diltiazem 300 MG 24 hr capsule  Commonly known as:  CARDIZEM CD  Take 300 mg by mouth at bedtime.     docusate sodium 100 MG capsule  Commonly known as:  COLACE  Take 100 mg by mouth 2 (two) times daily as needed for mild constipation.     fenofibrate micronized 43 MG capsule  Commonly known as:  ANTARA  Take 1 capsule (43 mg total) by mouth daily before breakfast.     ferrous sulfate 325 (65 FE) MG tablet  Take 325 mg by mouth daily.     furosemide  80 MG tablet  Commonly known as:  LASIX  Take 80 mg by mouth daily.     glucose blood test strip  Commonly known as:  ACCU-CHEK AVIVA PLUS  Use as instructed to check blood sugar 3 times per day dx code E11.65     insulin aspart 100 UNIT/ML injection  Commonly known as:  novoLOG  Inject 4-18 Units into the skin 3 (three) times daily with meals. 4 units before breakfast, 6 units before lunch, 18 units before supper     insulin glargine 100 UNIT/ML injection  Commonly known as:  LANTUS  Inject 38 Units into the skin daily. 38 units Qam     Insulin Syringe-Needle U-100 31G X 5/16" 1 ML Misc  Commonly known as:  B-D INS SYR ULTRAFINE 1CC/31G  Inject 1 each into the skin 3 (three) times daily.     levothyroxine 25 MCG tablet  Commonly known as:  SYNTHROID, LEVOTHROID  Take 1 tablet (25 mcg total) by mouth at bedtime.     losartan 100 MG tablet  Commonly known as:  COZAAR  Take 100 mg by mouth every morning.     montelukast 10 MG tablet  Commonly known as:  SINGULAIR  Take 10 mg by mouth at bedtime.     omeprazole 20 MG capsule  Commonly known as:  PRILOSEC  Take 20 mg by mouth every morning.     polyethylene glycol packet  Commonly known as:  MIRALAX / GLYCOLAX  Take 17 g by mouth daily as needed for mild constipation.     pravastatin 20 MG tablet  Commonly known as:  PRAVACHOL  Take 20 mg by mouth daily.     PROCRIT IJ  Inject 40,000 Units as directed every 21 ( twenty-one) days.     senna 8.6 MG Tabs tablet  Commonly known as:  SENOKOT  Take 1 tablet by mouth.        Allergies:  Allergies  Allergen Reactions  . Percocet [Oxycodone-Acetaminophen] Nausea And Vomiting  . Codeine     Felt like "my head was going to explode"  . Fish Allergy Itching  . Iodine Itching  . Zantac [Ranitidine Hcl] Nausea And Vomiting    Past Medical History  Diagnosis Date  . Hypertension   . Heart failure  sees dr Iona Coach at [facility] every 6 months  . Asthma   .  Diabetes mellitus, type 2   . Chronic kidney disease     sees dr Lorrene Reid nephrology every 4 months  . Anemia     on iron  . Sleep apnea     cpap setting of 2  . Cancer     pancreatic     Past Surgical History  Procedure Laterality Date  . Cholecystectomy  1973  . Total abdominal hysterectomy  1982  . Tubal ligation  1973  . Breast reduction surgery  2002  . Eus N/A 06/29/2013    Procedure: ESOPHAGEAL ENDOSCOPIC ULTRASOUND (EUS) RADIAL;  Surgeon: Arta Silence, MD;  Location: [facility];  Service: Endoscopy;  Laterality: N/A;  . Ercp N/A 06/29/2013    Procedure: ENDOSCOPIC RETROGRADE CHOLANGIOPANCREATOGRAPHY (ERCP);  Surgeon: Arta Silence, MD;  Location: [facility];  Service: Endoscopy;  Laterality: N/A;    Family History  Problem Relation Age of Onset  . Asthma Sister   . Asthma Mother   . Heart disease Father   . Cancer Sister     breast cancer  . Cancer Brother     kidney    Social History:  reports that she quit smoking about 49 years ago. Her smoking use included Cigarettes. She has a 2.7 pack-year smoking history. She has never used smokeless tobacco. She reports that she does not drink alcohol or use illicit drugs.  Review of Systems:  She has  been diagnosed to have pancreatic cancer and is opting for no treatment, no abdominal pain   HYPERTENSION:  long-standing and well controlled with current regimen   HYPOTHYROID: TSH has been normal with taking small dose  of 25 mcg levothyroxine. She is asking about taking Armour Thyroid because she continues to be somewhat fatigued  Lab Results  Component Value Date   TSH 1.75 05/31/2013    Chronic kidney disease: Followed by nephrologist periodically  Creatinine is stable  Lab Results  Component Value Date   CREATININE 1.69* 01/18/2014     Has been getting Aranesp for her anemia anaemia followed by a hematologist  Lab Results  Component Value Date   WBC 6.5 12/27/2013   HGB 10.8* 12/27/2013   HCT  34.5* 12/27/2013   MCV 85.6 12/27/2013   PLT 201 12/27/2013     HYPERLIPIDEMIA: The lipid abnormality consists mostly of high triglycerides and also high LDL She is on low-dose fenofibrate and also pravastatin 20 mg    Lab Results  Component Value Date   CHOL 203* 05/31/2013   HDL 30.70* 05/31/2013   LDLCALC 130* 05/31/2013   TRIG 212.0* 05/31/2013   CHOLHDL 7 05/31/2013        Examination:   BP 138/64 mmHg  Pulse 54  Temp(Src) 98.1 F (36.7 C)  Resp 16  Ht $R'5\' 8"'eW$  (1.727 m)  Wt 265 lb 12.8 oz (120.566 kg)  BMI 40.42 kg/m2  SpO2 99%  Body mass index is 40.42 kg/(m^2).     ASSESSMENT/ PLAN:   Diabetes type 2   The patient's glucose readings are relatively well controlled even with some simplification of her insulin regimen Because of her decreased appetite and also some weight loss she is requiring less insulin compared to a year ago Again discussed that she does not need to have any aggressive control of her diabetes She is probably getting occasionally too much insulin for her evening meal and will reduce this further  Also since sometimes  her fasting glucose and blood sugar before supper readings are low normal will reduce her Lantus She can try doing postprandial dosing of her NovoLog to better assess how much she is actually eating    HYPERTENSION: Well controlled  Renal function stable  Patient Instructions  Lantus 34  units a.m. and NovoLog 4-6 at lunch and 8-10  at supper based on meal size and take right after meal        Jamere Stidham 01/23/2014, 3:33 PM

## 2014-02-07 ENCOUNTER — Other Ambulatory Visit (HOSPITAL_BASED_OUTPATIENT_CLINIC_OR_DEPARTMENT_OTHER): Payer: Medicare PPO

## 2014-02-07 ENCOUNTER — Ambulatory Visit (HOSPITAL_BASED_OUTPATIENT_CLINIC_OR_DEPARTMENT_OTHER): Payer: Medicare PPO

## 2014-02-07 DIAGNOSIS — D631 Anemia in chronic kidney disease: Secondary | ICD-10-CM

## 2014-02-07 DIAGNOSIS — D649 Anemia, unspecified: Secondary | ICD-10-CM

## 2014-02-07 DIAGNOSIS — N189 Chronic kidney disease, unspecified: Secondary | ICD-10-CM

## 2014-02-07 DIAGNOSIS — N183 Chronic kidney disease, stage 3 unspecified: Secondary | ICD-10-CM

## 2014-02-07 LAB — CBC WITH DIFFERENTIAL/PLATELET
BASO%: 0.9 % (ref 0.0–2.0)
Basophils Absolute: 0.1 10*3/uL (ref 0.0–0.1)
EOS%: 7.2 % — ABNORMAL HIGH (ref 0.0–7.0)
Eosinophils Absolute: 0.5 10*3/uL (ref 0.0–0.5)
HCT: 32.9 % — ABNORMAL LOW (ref 34.8–46.6)
HGB: 10.3 g/dL — ABNORMAL LOW (ref 11.6–15.9)
LYMPH%: 31.7 % (ref 14.0–49.7)
MCH: 26.4 pg (ref 25.1–34.0)
MCHC: 31.3 g/dL — ABNORMAL LOW (ref 31.5–36.0)
MCV: 84.3 fL (ref 79.5–101.0)
MONO#: 0.4 10*3/uL (ref 0.1–0.9)
MONO%: 6.6 % (ref 0.0–14.0)
NEUT#: 3.5 10*3/uL (ref 1.5–6.5)
NEUT%: 53.6 % (ref 38.4–76.8)
Platelets: 181 10*3/uL (ref 145–400)
RBC: 3.91 10*6/uL (ref 3.70–5.45)
RDW: 15.7 % — AB (ref 11.2–14.5)
WBC: 6.6 10*3/uL (ref 3.9–10.3)
lymph#: 2.1 10*3/uL (ref 0.9–3.3)

## 2014-02-07 MED ORDER — EPOETIN ALFA 40000 UNIT/ML IJ SOLN
40000.0000 [IU] | INTRAMUSCULAR | Status: DC
  Administered 2014-02-07: 40000 [IU] via SUBCUTANEOUS
  Filled 2014-02-07: qty 1

## 2014-02-13 ENCOUNTER — Other Ambulatory Visit: Payer: Self-pay | Admitting: *Deleted

## 2014-02-13 MED ORDER — ALLOPURINOL 100 MG PO TABS: 100.0000 mg | ORAL_TABLET | Freq: Every day | ORAL | Status: DC

## 2014-02-21 ENCOUNTER — Ambulatory Visit: Payer: Medicare PPO | Admitting: Podiatry

## 2014-02-23 ENCOUNTER — Encounter: Payer: Self-pay | Admitting: Podiatrist

## 2014-02-23 ENCOUNTER — Ambulatory Visit (INDEPENDENT_AMBULATORY_CARE_PROVIDER_SITE_OTHER): Payer: Medicare PPO | Admitting: Podiatrist

## 2014-02-23 DIAGNOSIS — M79676 Pain in unspecified toe(s): Secondary | ICD-10-CM

## 2014-02-23 DIAGNOSIS — B351 Tinea unguium: Secondary | ICD-10-CM

## 2014-02-23 DIAGNOSIS — Q828 Other specified congenital malformations of skin: Secondary | ICD-10-CM

## 2014-02-26 NOTE — Progress Notes (Signed)
Chief Complaint  Patient presents with  . Nail Problem    debridement      Objective: Pulses are palpable.  Toenails x 10 are thick, yellow dystrophic onychomycosis and painful palpation. Painful reactive hyperkeratosis/porokeratosis plantar aspect fifth metatarsal and first metatarsal heads bilateral. No ulcerations no open lesions. No signs of infection.  Assessment: Onychomycosis with pain in limb.porokeratotic lesions plantar aspect of the bilateral foot.  Plan: debridement of nails performed without complication.  debrided all porokeratotic lesions.

## 2014-02-27 ENCOUNTER — Other Ambulatory Visit: Payer: Self-pay | Admitting: *Deleted

## 2014-02-27 DIAGNOSIS — E1165 Type 2 diabetes mellitus with hyperglycemia: Secondary | ICD-10-CM

## 2014-02-27 DIAGNOSIS — IMO0002 Reserved for concepts with insufficient information to code with codable children: Secondary | ICD-10-CM

## 2014-02-27 MED ORDER — INSULIN GLARGINE 100 UNIT/ML ~~LOC~~ SOLN: 38.0000 [IU] | Freq: Every day | SUBCUTANEOUS | Status: DC

## 2014-02-27 MED ORDER — FENOFIBRATE MICRONIZED 43 MG PO CAPS: 43.0000 mg | ORAL_CAPSULE | Freq: Every day | ORAL | Status: DC

## 2014-02-28 ENCOUNTER — Other Ambulatory Visit (HOSPITAL_BASED_OUTPATIENT_CLINIC_OR_DEPARTMENT_OTHER): Payer: Medicare PPO

## 2014-02-28 ENCOUNTER — Ambulatory Visit (HOSPITAL_BASED_OUTPATIENT_CLINIC_OR_DEPARTMENT_OTHER): Payer: Medicare PPO

## 2014-02-28 DIAGNOSIS — D631 Anemia in chronic kidney disease: Secondary | ICD-10-CM

## 2014-02-28 DIAGNOSIS — N183 Chronic kidney disease, stage 3 (moderate): Principal | ICD-10-CM

## 2014-02-28 DIAGNOSIS — N189 Chronic kidney disease, unspecified: Secondary | ICD-10-CM

## 2014-02-28 DIAGNOSIS — D649 Anemia, unspecified: Secondary | ICD-10-CM

## 2014-02-28 LAB — CBC WITH DIFFERENTIAL/PLATELET
BASO%: 0.8 % (ref 0.0–2.0)
BASOS ABS: 0.1 10*3/uL (ref 0.0–0.1)
EOS%: 5.9 % (ref 0.0–7.0)
Eosinophils Absolute: 0.4 10*3/uL (ref 0.0–0.5)
HCT: 33.1 % — ABNORMAL LOW (ref 34.8–46.6)
HEMOGLOBIN: 10.2 g/dL — AB (ref 11.6–15.9)
LYMPH%: 25.5 % (ref 14.0–49.7)
MCH: 26.2 pg (ref 25.1–34.0)
MCHC: 30.7 g/dL — ABNORMAL LOW (ref 31.5–36.0)
MCV: 85.3 fL (ref 79.5–101.0)
MONO#: 0.5 10*3/uL (ref 0.1–0.9)
MONO%: 7.5 % (ref 0.0–14.0)
NEUT#: 3.7 10*3/uL (ref 1.5–6.5)
NEUT%: 60.3 % (ref 38.4–76.8)
Platelets: 196 10*3/uL (ref 145–400)
RBC: 3.88 10*6/uL (ref 3.70–5.45)
RDW: 15.8 % — AB (ref 11.2–14.5)
WBC: 6.2 10*3/uL (ref 3.9–10.3)
lymph#: 1.6 10*3/uL (ref 0.9–3.3)

## 2014-02-28 MED ORDER — EPOETIN ALFA 40000 UNIT/ML IJ SOLN
40000.0000 [IU] | INTRAMUSCULAR | Status: DC
  Administered 2014-02-28: 40000 [IU] via SUBCUTANEOUS
  Filled 2014-02-28: qty 1

## 2014-03-20 ENCOUNTER — Other Ambulatory Visit: Payer: Self-pay | Admitting: *Deleted

## 2014-03-20 DIAGNOSIS — D649 Anemia, unspecified: Secondary | ICD-10-CM

## 2014-03-21 ENCOUNTER — Other Ambulatory Visit (HOSPITAL_BASED_OUTPATIENT_CLINIC_OR_DEPARTMENT_OTHER): Payer: Medicare PPO

## 2014-03-21 ENCOUNTER — Ambulatory Visit (HOSPITAL_COMMUNITY)
Admission: RE | Admit: 2014-03-21 | Discharge: 2014-03-21 | Disposition: A | Discharge status: Home / Self Care | Payer: Medicare PPO | Source: Ambulatory Visit | Attending: Nurse Practitioner | Admitting: Nurse Practitioner

## 2014-03-21 ENCOUNTER — Ambulatory Visit: Payer: Medicare PPO

## 2014-03-21 ENCOUNTER — Telehealth: Payer: Self-pay | Admitting: Oncology

## 2014-03-21 ENCOUNTER — Ambulatory Visit (HOSPITAL_BASED_OUTPATIENT_CLINIC_OR_DEPARTMENT_OTHER): Payer: Medicare PPO | Admitting: Nurse Practitioner

## 2014-03-21 ENCOUNTER — Other Ambulatory Visit: Payer: Self-pay | Admitting: *Deleted

## 2014-03-21 VITALS — BP 168/55 | HR 43 | Temp 97.9°F | Resp 20 | Ht 68.0 in | Wt 253.3 lb

## 2014-03-21 DIAGNOSIS — D649 Anemia, unspecified: Secondary | ICD-10-CM

## 2014-03-21 DIAGNOSIS — C259 Malignant neoplasm of pancreas, unspecified: Secondary | ICD-10-CM

## 2014-03-21 DIAGNOSIS — D631 Anemia in chronic kidney disease: Secondary | ICD-10-CM

## 2014-03-21 DIAGNOSIS — M898X2 Other specified disorders of bone, upper arm: Secondary | ICD-10-CM | POA: Insufficient documentation

## 2014-03-21 DIAGNOSIS — IMO0002 Reserved for concepts with insufficient information to code with codable children: Secondary | ICD-10-CM

## 2014-03-21 DIAGNOSIS — N183 Chronic kidney disease, stage 3 (moderate): Principal | ICD-10-CM

## 2014-03-21 DIAGNOSIS — M79622 Pain in left upper arm: Secondary | ICD-10-CM

## 2014-03-21 DIAGNOSIS — M25812 Other specified joint disorders, left shoulder: Secondary | ICD-10-CM | POA: Diagnosis not present

## 2014-03-21 DIAGNOSIS — E1165 Type 2 diabetes mellitus with hyperglycemia: Secondary | ICD-10-CM

## 2014-03-21 DIAGNOSIS — C250 Malignant neoplasm of head of pancreas: Secondary | ICD-10-CM

## 2014-03-21 LAB — CBC WITH DIFFERENTIAL/PLATELET
BASO%: 0.3 % (ref 0.0–2.0)
Basophils Absolute: 0 10*3/uL (ref 0.0–0.1)
EOS%: 3.5 % (ref 0.0–7.0)
Eosinophils Absolute: 0.2 10*3/uL (ref 0.0–0.5)
HCT: 34.6 % — ABNORMAL LOW (ref 34.8–46.6)
HGB: 11 g/dL — ABNORMAL LOW (ref 11.6–15.9)
LYMPH%: 27 % (ref 14.0–49.7)
MCH: 26.9 pg (ref 25.1–34.0)
MCHC: 31.8 g/dL (ref 31.5–36.0)
MCV: 84.6 fL (ref 79.5–101.0)
MONO#: 0.4 10*3/uL (ref 0.1–0.9)
MONO%: 6.9 % (ref 0.0–14.0)
NEUT#: 3.9 10*3/uL (ref 1.5–6.5)
NEUT%: 62.3 % (ref 38.4–76.8)
Platelets: 206 10*3/uL (ref 145–400)
RBC: 4.09 10*6/uL (ref 3.70–5.45)
RDW: 15.6 % — ABNORMAL HIGH (ref 11.2–14.5)
WBC: 6.2 10*3/uL (ref 3.9–10.3)
lymph#: 1.7 10*3/uL (ref 0.9–3.3)
nRBC: 0 % (ref 0–0)

## 2014-03-21 MED ORDER — TRAMADOL HCL 50 MG PO TABS: 50.0000 mg | ORAL_TABLET | Freq: Two times a day (BID) | ORAL | Status: DC | PRN

## 2014-03-21 MED ORDER — "INSULIN SYRINGE-NEEDLE U-100 31G X 5/16"" 1 ML MISC": 1.0000 | Freq: Three times a day (TID) | Status: DC

## 2014-03-21 MED ORDER — EPOETIN ALFA 40000 UNIT/ML IJ SOLN: 40000.0000 [IU] | INTRAMUSCULAR | Status: DC

## 2014-03-21 NOTE — Progress Notes (Addendum)
  [place] OFFICE PROGRESS NOTE   Diagnosis:  Anemia, pancreas cancer  INTERVAL HISTORY:   [name] returns as scheduled. She continues every 3 week Procrit injections. She denies abdominal pain. She continues to have promised of constipation. No nausea or vomiting. In general she feels weak. She has stable dyspnea on exertion. Over the past 4 days she has had consistent pain at the left upper arm. She is unable to raise the arm due to the pain. No known injury.  Objective:  Vital signs in last 24 hours:  Blood pressure 168/55, pulse 43, temperature 97.9 F (36.6 C), temperature source Oral, resp. rate 20, height 5\' 8"  (1.727 m), weight 253 lb 4.8 oz (114.896 kg).    HEENT: No thrush or ulcers. Lymphatics: 3-4 center meter left inguinal/femoral node. Resp: Lungs clear. Breath sounds diminished at the bases. Cardio: Regular rate and rhythm. GI: Abdomen soft and nontender. No hepatomegaly. No mass. Vascular: No edema. Chronic stasis changes bilaterally. Neuro: Alert and oriented. Musculoskeletal: Marked tenderness left mid upper arm. Arm strength appears limited by pain.     Lab Results:  Lab Results  Component Value Date   WBC 6.2 03/21/2014   HGB 11.0* 03/21/2014   HCT 34.6* 03/21/2014   MCV 84.6 03/21/2014   PLT 206 03/21/2014   NEUTROABS 3.9 03/21/2014    Imaging:  No results found.  Medications: I have reviewed the patient's current medications.  Assessment/Plan: 1. Pancreas cancer.  Abdominal ultrasound 06/21/2013 with intrahepatic and extra hepatic biliary ductal dilatation. Pancreatic duct borderline dilated.   MRCP 06/26/2013 with severe intra and extrahepatic biliary ductal dilatation. The common bile duct abruptly terminated in the region of the superior aspect of the pancreatic head where there was a masslike area of amorphous soft tissue.   Endoscopic ultrasound 06/29/2013 with significant intra-and extrahepatic biliary ductal  dilatation, pancreatic head mass that seemed to invade the portal confluence and superior mesenteric vein. A few scattered malignant appearing peripancreatic lymph nodes. EUS staging T3 N1 Mx.   Biopsy of the pancreas head mass showed malignant cells consistent with adenocarcinoma.   Staging PET scan with a hypermetabolic pancreas head mass,. Precordial nodule, and enlarged hypermetabolic left inguinal node 2. 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 3. Hypertension. 4. Diabetes. 5. Heart failure. 6. Chronic kidney disease. 7. Anemia. 8. Hypothyroid. 9. Sleep apnea. 10. Asthma.   Disposition: [name] is having significant left upper arm pain. We are referring her for a plain x-ray of the humerus. She was given a prescription for tramadol 50 mg every 12 hours as needed.  She does not meet laboratory criteria for a Procrit injection today (hemoglobin 11). She will return for labs and a possible Procrit injection in 3 weeks.  She continues follow-up with her physician at [facility] for management of the pancreas cancer.  She will return for a follow-up visit here in approximately 2 months.   Patient seen with Dr. Benay Spice.   Ned Card ANP/GNP-BC   03/21/2014  11:20 AM  This was a shared visit with Ned Card. [name] was interviewed and examined. She will be referred for imaging of the left arm.  Julieanne Manson, MD

## 2014-03-21 NOTE — Telephone Encounter (Signed)
gave and printed appt sched and avs for patient for April and May.Marland Kitchen

## 2014-03-22 ENCOUNTER — Telehealth: Payer: Self-pay | Admitting: *Deleted

## 2014-03-22 ENCOUNTER — Telehealth: Payer: Self-pay | Admitting: Oncology

## 2014-03-22 ENCOUNTER — Other Ambulatory Visit: Payer: Self-pay | Admitting: Nurse Practitioner

## 2014-03-22 DIAGNOSIS — M79622 Pain in left upper arm: Secondary | ICD-10-CM

## 2014-03-22 DIAGNOSIS — C259 Malignant neoplasm of pancreas, unspecified: Secondary | ICD-10-CM

## 2014-03-22 NOTE — Telephone Encounter (Signed)
per pt request she wants on 5/3.Marland KitchenMarland Kitchenpt have print out from yesterday

## 2014-03-22 NOTE — Telephone Encounter (Signed)
-----   Message from Owens Shark, NP sent at 03/22/2014  8:58 AM EDT ----- Please let her know xray was negative. Dr Benay Spice recommends proceeding with a CT scan.

## 2014-03-22 NOTE — Telephone Encounter (Signed)
Left message for return call for xray results and recommendation to have CT Scan completed.

## 2014-03-23 ENCOUNTER — Telehealth: Payer: Self-pay

## 2014-03-23 NOTE — Telephone Encounter (Signed)
Pt called requesting Xray results. States she had a VM yesterday but wasn't able to listen to it. Informed pt Xray was negative and Dr. Benay Spice recommends proceeding with Ct scan. informed pt radiology should be calling to schedule that appt. Pt verbalized understanding and states she will be back in town on the 21st.

## 2014-03-27 ENCOUNTER — Telehealth: Payer: Self-pay | Admitting: *Deleted

## 2014-03-27 NOTE — Telephone Encounter (Signed)
Received phone call from patient asking if radiology would contact her regarding CT scan or if she needed to contact radiology.  Informed patient that Radiology should be contacting her with appointment.  Patient verbalized understanding.

## 2014-03-31 ENCOUNTER — Ambulatory Visit (HOSPITAL_COMMUNITY)
Admission: RE | Admit: 2014-03-31 | Discharge: 2014-03-31 | Disposition: A | Discharge status: Home / Self Care | Payer: Medicare PPO | Source: Ambulatory Visit | Attending: Nurse Practitioner | Admitting: Nurse Practitioner

## 2014-03-31 ENCOUNTER — Encounter (HOSPITAL_COMMUNITY): Payer: Self-pay

## 2014-03-31 DIAGNOSIS — M79622 Pain in left upper arm: Secondary | ICD-10-CM

## 2014-03-31 DIAGNOSIS — M1990 Unspecified osteoarthritis, unspecified site: Secondary | ICD-10-CM | POA: Insufficient documentation

## 2014-03-31 DIAGNOSIS — C259 Malignant neoplasm of pancreas, unspecified: Secondary | ICD-10-CM

## 2014-04-03 ENCOUNTER — Telehealth: Payer: Self-pay

## 2014-04-03 DIAGNOSIS — M1990 Unspecified osteoarthritis, unspecified site: Secondary | ICD-10-CM

## 2014-04-03 NOTE — Telephone Encounter (Signed)
Pt calling for CT results from 3/25. Informed pt results were back and we would call her with results once reviewed by Dr. Benay Spice. Printed and placed on Dr. Gearldine Shown desk for review.

## 2014-04-03 NOTE — Telephone Encounter (Signed)
Called pt back regarding CT results. Per Dr. Benay Spice results show arthritis  and pt needs to see Orthopaedist. Informed pt scheduling dept will contact her with this appt time once they get it set up. Referral and POF sent.

## 2014-04-18 ENCOUNTER — Ambulatory Visit (HOSPITAL_BASED_OUTPATIENT_CLINIC_OR_DEPARTMENT_OTHER): Payer: Medicare PPO

## 2014-04-18 ENCOUNTER — Other Ambulatory Visit (HOSPITAL_BASED_OUTPATIENT_CLINIC_OR_DEPARTMENT_OTHER): Payer: Medicare PPO

## 2014-04-18 DIAGNOSIS — C250 Malignant neoplasm of head of pancreas: Secondary | ICD-10-CM | POA: Diagnosis not present

## 2014-04-18 DIAGNOSIS — N189 Chronic kidney disease, unspecified: Secondary | ICD-10-CM | POA: Diagnosis not present

## 2014-04-18 DIAGNOSIS — N183 Chronic kidney disease, stage 3 unspecified: Secondary | ICD-10-CM

## 2014-04-18 DIAGNOSIS — D631 Anemia in chronic kidney disease: Secondary | ICD-10-CM | POA: Diagnosis not present

## 2014-04-18 DIAGNOSIS — D649 Anemia, unspecified: Secondary | ICD-10-CM

## 2014-04-18 DIAGNOSIS — C259 Malignant neoplasm of pancreas, unspecified: Secondary | ICD-10-CM

## 2014-04-18 LAB — CBC WITH DIFFERENTIAL/PLATELET
BASO%: 0.3 % (ref 0.0–2.0)
Basophils Absolute: 0 10*3/uL (ref 0.0–0.1)
EOS%: 5.8 % (ref 0.0–7.0)
Eosinophils Absolute: 0.4 10*3/uL (ref 0.0–0.5)
HCT: 29.7 % — ABNORMAL LOW (ref 34.8–46.6)
HGB: 9.3 g/dL — ABNORMAL LOW (ref 11.6–15.9)
LYMPH%: 29.9 % (ref 14.0–49.7)
MCH: 26.8 pg (ref 25.1–34.0)
MCHC: 31.3 g/dL — AB (ref 31.5–36.0)
MCV: 85.6 fL (ref 79.5–101.0)
MONO#: 0.5 10*3/uL (ref 0.1–0.9)
MONO%: 7.8 % (ref 0.0–14.0)
NEUT#: 3.4 10*3/uL (ref 1.5–6.5)
NEUT%: 56.2 % (ref 38.4–76.8)
NRBC: 0 % (ref 0–0)
Platelets: 220 10*3/uL (ref 145–400)
RBC: 3.47 10*6/uL — AB (ref 3.70–5.45)
RDW: 15.9 % — ABNORMAL HIGH (ref 11.2–14.5)
WBC: 6.1 10*3/uL (ref 3.9–10.3)
lymph#: 1.8 10*3/uL (ref 0.9–3.3)

## 2014-04-18 MED ORDER — EPOETIN ALFA 40000 UNIT/ML IJ SOLN
40000.0000 [IU] | INTRAMUSCULAR | Status: DC
  Administered 2014-04-18: 40000 [IU] via SUBCUTANEOUS
  Filled 2014-04-18: qty 1

## 2014-04-18 NOTE — Patient Instructions (Signed)

## 2014-04-24 ENCOUNTER — Ambulatory Visit (INDEPENDENT_AMBULATORY_CARE_PROVIDER_SITE_OTHER): Payer: Medicare PPO | Admitting: Endocrinology

## 2014-04-24 ENCOUNTER — Encounter: Payer: Self-pay | Admitting: Endocrinology

## 2014-04-24 VITALS — BP 130/84 | HR 50 | Temp 98.4°F | Resp 16 | Ht 68.0 in | Wt 246.6 lb

## 2014-04-24 DIAGNOSIS — E1165 Type 2 diabetes mellitus with hyperglycemia: Secondary | ICD-10-CM | POA: Diagnosis not present

## 2014-04-24 DIAGNOSIS — N183 Chronic kidney disease, stage 3 unspecified: Secondary | ICD-10-CM

## 2014-04-24 DIAGNOSIS — E039 Hypothyroidism, unspecified: Secondary | ICD-10-CM | POA: Diagnosis not present

## 2014-04-24 DIAGNOSIS — IMO0002 Reserved for concepts with insufficient information to code with codable children: Secondary | ICD-10-CM

## 2014-04-24 NOTE — Patient Instructions (Signed)
Please check blood sugars at least half the time about 2 hours after any meal and 3 times per week on waking up.   Please bring blood sugar monitor to each visit. Recommended blood sugar levels about 2 hours after meal is 140-200 and on waking up 90-130  Reduce Lantus to 32 and if sugar is > 200 in mid-am may use 5-6 Novolog for that meal  With cotisone if sugar is >200 may go up to 38 Lantus and take Novolog when >200

## 2014-04-24 NOTE — Progress Notes (Signed)
Patient ID: [name], female   DOB: [birth year], 81 y.o.   MRN: [id number]   Reason for Appointment: Diabetes follow-up    History of Present Illness   Diagnosis: Type 2 DIABETES MELITUS, date of diagnosis:  1997  Past history: She was started on insulin within a year of diagnosis; had diarrhea from metformin and swelling from Actos Generally her blood sugars are fairly well-controlled with basal bolus insulin regimen and A1c mostly below 7 although this is usually not representative of her actual blood sugars because of her renal dysfunction and anemia  RECENT history:   Insulin regimen: Lantus 35  units a.m. and NovoLog 0        She has had relatively lower blood sugars over the last few months related to her weight loss and decreased appetite from her pancreatic cancer Her Lantus insulin dose was reduced further on her last visit and she was also told to reduce her NovoLog Because of her decreased appetite especially at lunch and dinner she is not taking NovoLog now She is mostly eating a breakfast meal and if she is not able to eat she will drink boost or Ensure  Current blood sugar readings show fairly good readings in the morning especially recently without overnight hypoglycemia; blood sugars maybe occasionally higher midday, one time over 200. Blood sugars in the evenings are variable but has sporadic low readings between 6-9 PM including last evening despite not taking any NovoLog Blood sugar average for the last 2 weeks is only 124 A1c has not been checked since she does not need aggressive control  Hypoglycemia: Blood sugars have been low twice in the last month as above  Monitors blood glucose:  1-2 times a day.    Glucometer:  recently Accu-Chek with the following download:  PRE-MEAL Breakfast  midday   6-10 PM  Bedtime Overall  Glucose range:  96-160   213, 167   55-179     Mean/median:  120    127    124      Meals:  1-2 meals per day.  breakfast:  Getting yogurt or boost Physical activity:  unable to do much            Dietician visit: Most recent: 3/53          Complications: are: ? Nephropathy, last microalbumin/ creatinine ratio was 87 in 2/14     Wt Readings from Last 3 Encounters:  04/24/14 246 lb 9.6 oz (111.857 kg)  03/21/14 253 lb 4.8 oz (114.896 kg)  01/23/14 265 lb 12.8 oz (120.566 kg)   Lab Results  Component Value Date   HGBA1C 6.0 01/18/2014   HGBA1C 4.6 08/29/2013   HGBA1C 5.4 05/31/2013   Lab Results  Component Value Date   MICROALBUR 209.5* 05/31/2013   LDLCALC 130* 05/31/2013   CREATININE 1.69* 01/18/2014         Medication List       This list is accurate as of: 04/24/14  9:19 PM.  Always use your most recent med list.               ACCU-CHEK AVIVA PLUS W/DEVICE Kit  Use to check blood sugar 3 times per day dx code 250.02     ACCU-CHEK FASTCLIX LANCETS Misc  Use to obtain blood specimen 3 times per day     ADVAIR DISKUS 250-50 MCG/DOSE Aepb  Generic drug:  Fluticasone-Salmeterol  Inhale 1 puff into the lungs every 12 (twelve) hours.  albuterol 108 (90 BASE) MCG/ACT inhaler  Commonly known as:  PROVENTIL HFA;VENTOLIN HFA  Inhale 2 puffs into the lungs every 6 (six) hours as needed for wheezing or shortness of breath.     allopurinol 100 MG tablet  Commonly known as:  ZYLOPRIM  Take 1 tablet (100 mg total) by mouth daily.     aspirin EC 81 MG tablet  Take 81 mg by mouth daily.     diltiazem 300 MG 24 hr capsule  Commonly known as:  CARDIZEM CD  Take 300 mg by mouth at bedtime.     docusate sodium 100 MG capsule  Commonly known as:  COLACE  Take 100 mg by mouth 2 (two) times daily as needed for mild constipation.     fenofibrate micronized 43 MG capsule  Commonly known as:  ANTARA  Take 1 capsule (43 mg total) by mouth daily before breakfast.     ferrous sulfate 325 (65 FE) MG tablet  Take 325 mg by mouth daily.     furosemide 40 MG tablet  Commonly known as:  LASIX       glucose blood test strip  Commonly known as:  ACCU-CHEK AVIVA PLUS  Use as instructed to check blood sugar 3 times per day dx code E11.65     insulin aspart 100 UNIT/ML injection  Commonly known as:  novoLOG  Inject 4-18 Units into the skin 3 (three) times daily with meals. 4 units before breakfast, 6 units before lunch, 18 units before supper     insulin glargine 100 UNIT/ML injection  Commonly known as:  LANTUS  Inject 0.38 mLs (38 Units total) into the skin daily. 38 units Qam     Insulin Syringe-Needle U-100 31G X 5/16" 1 ML Misc  Commonly known as:  B-D INS SYR ULTRAFINE 1CC/31G  Inject 1 each into the skin 3 (three) times daily.     levothyroxine 25 MCG tablet  Commonly known as:  SYNTHROID, LEVOTHROID  Take 1 tablet (25 mcg total) by mouth at bedtime.     losartan 100 MG tablet  Commonly known as:  COZAAR  Take 100 mg by mouth every morning.     montelukast 10 MG tablet  Commonly known as:  SINGULAIR  Take 10 mg by mouth at bedtime.     omeprazole 20 MG capsule  Commonly known as:  PRILOSEC  Take 20 mg by mouth every morning.     polyethylene glycol packet  Commonly known as:  MIRALAX / GLYCOLAX  Take 17 g by mouth daily as needed for mild constipation.     pravastatin 20 MG tablet  Commonly known as:  PRAVACHOL  Take 20 mg by mouth daily.     PROCRIT IJ  Inject 40,000 Units as directed every 21 ( twenty-one) days.     traMADol 50 MG tablet  Commonly known as:  ULTRAM  Take 1 tablet (50 mg total) by mouth every 12 (twelve) hours as needed for moderate pain.        Allergies:  Allergies  Allergen Reactions  . Percocet [Oxycodone-Acetaminophen] Nausea And Vomiting  . Codeine     Felt like "my head was going to explode"  . Fish Allergy Itching  . Iodine Itching  . Zantac [Ranitidine Hcl] Nausea And Vomiting    Past Medical History  Diagnosis Date  . Hypertension   . Heart failure     sees dr Iona Coach at [facility] every 6 months  . Asthma   .  Diabetes mellitus, type 2   . Chronic kidney disease     sees dr Lorrene Reid nephrology every 4 months  . Anemia     on iron  . Sleep apnea     cpap setting of 2  . Cancer     pancreatic     Past Surgical History  Procedure Laterality Date  . Cholecystectomy  1973  . Total abdominal hysterectomy  1982  . Tubal ligation  1973  . Breast reduction surgery  2002  . Eus N/A 06/29/2013    Procedure: ESOPHAGEAL ENDOSCOPIC ULTRASOUND (EUS) RADIAL;  Surgeon: Arta Silence, MD;  Location: [facility];  Service: Endoscopy;  Laterality: N/A;  . Ercp N/A 06/29/2013    Procedure: ENDOSCOPIC RETROGRADE CHOLANGIOPANCREATOGRAPHY (ERCP);  Surgeon: Arta Silence, MD;  Location: [facility];  Service: Endoscopy;  Laterality: N/A;    Family History  Problem Relation Age of Onset  . Asthma Sister   . Asthma Mother   . Heart disease Father   . Cancer Sister     breast cancer  . Cancer Brother     kidney    Social History:  reports that she quit smoking about 49 years ago. Her smoking use included Cigarettes. She has a 2.7 pack-year smoking history. She has never used smokeless tobacco. She reports that she does not drink alcohol or use illicit drugs.  Review of Systems:  She has  pancreatic cancer and is opting for no treatment, no abdominal pain recently but has continued weight loss   HYPERTENSION:  long-standing and well controlled with current regimen   HYPOTHYROID: TSH has been normal with taking small dose  of 25 mcg levothyroxine.   Lab Results  Component Value Date   TSH 1.75 05/31/2013    Chronic kidney disease: Followed by nephrologist periodically  Creatinine is recently relatively high at 2.0 done at [facility]  Lab Results  Component Value Date   CREATININE 1.69* 01/18/2014     Has been getting Aranesp for her anemia which is followed by a hematologist  Lab Results  Component Value Date   WBC 6.1 04/18/2014   HGB 9.3* 04/18/2014   HCT 29.7* 04/18/2014   MCV 85.6  04/18/2014   PLT 220 04/18/2014     HYPERLIPIDEMIA: The lipid abnormality consists mostly of high triglycerides and also high LDL She is on low-dose fenofibrate and also pravastatin 20 mg , last lipid results were relatively high   Lab Results  Component Value Date   CHOL 203* 05/31/2013   HDL 30.70* 05/31/2013   LDLCALC 130* 05/31/2013   TRIG 212.0* 05/31/2013   CHOLHDL 7 05/31/2013        Examination:   BP 130/84 mmHg  Pulse 50  Temp(Src) 98.4 F (36.9 C)  Resp 16  Ht 5' 8"  (1.727 m)  Wt 246 lb 9.6 oz (111.857 kg)  BMI 37.50 kg/m2  SpO2 97%  Body mass index is 37.5 kg/(m^2).     ASSESSMENT/ PLAN:   Diabetes type 2   The patient's glucose readings are relatively well controlled even with taking only Lantus once a day and no other insulin Because of her decreased appetite and also some weight loss she is requiring progressively less insulin compared to a year ago  Most of her blood sugars are fairly good and overall averaging about 124 although checking mostly before meals She is not able to eat much especially at lunch and dinner Because of her blood sugar being somewhat lower recently will reduce her Lantus  further Again discussed that she does not need to have any aggressive control of her diabetes No need for A1c as this will not modify her treatment  Discussed need for adjusting her doses of insulin and possibly needing NovoLog if her blood sugar goes significantly higher with getting her steroid injection in her left shoulder in the next few days  HYPERTENSION: Well controlled  Renal function is gradually worsening with creatinine 2.0, will defer management to a nephrologist  Patient Instructions  Please check blood sugars at least half the time about 2 hours after any meal and 3 times per week on waking up.   Please bring blood sugar monitor to each visit. Recommended blood sugar levels about 2 hours after meal is 140-200 and on waking up  90-130  Reduce Lantus to 32 and if sugar is > 200 in mid-am may use 5-6 Novolog for that meal  With cotisone if sugar is >200 may go up to 38 Lantus and take Novolog when >200        Dandra Velardi 04/24/2014, 9:19 PM

## 2014-05-09 ENCOUNTER — Other Ambulatory Visit: Payer: Medicare PPO

## 2014-05-09 ENCOUNTER — Ambulatory Visit: Payer: Medicare PPO

## 2014-05-11 ENCOUNTER — Ambulatory Visit (INDEPENDENT_AMBULATORY_CARE_PROVIDER_SITE_OTHER): Payer: Medicare PPO

## 2014-05-11 DIAGNOSIS — Q828 Other specified congenital malformations of skin: Secondary | ICD-10-CM | POA: Diagnosis not present

## 2014-05-11 DIAGNOSIS — M79676 Pain in unspecified toe(s): Secondary | ICD-10-CM | POA: Diagnosis not present

## 2014-05-11 DIAGNOSIS — B351 Tinea unguium: Secondary | ICD-10-CM

## 2014-05-12 NOTE — Progress Notes (Signed)
Chief Complaint  Patient presents with  . Callouses    "Trimming toenails and callouses." 10 toenails and callouses      Objective: Pulses are palpable.  Toenails x 10 are thick, yellow dystrophic onychomycosis and painful palpation. Painful reactive hyperkeratosis/porokeratosis plantar aspect fifth metatarsal and first metatarsal heads bilateral. No ulcerations no open lesions. No signs of infection.  Assessment: Onychomycosis with pain in limb.porokeratotic lesions plantar aspect of the bilateral foot.  Plan: debridement of nails performed without complication.  debrided all porokeratotic lesions.

## 2014-05-30 ENCOUNTER — Ambulatory Visit (HOSPITAL_BASED_OUTPATIENT_CLINIC_OR_DEPARTMENT_OTHER): Payer: Medicare PPO

## 2014-05-30 ENCOUNTER — Telehealth: Payer: Self-pay | Admitting: Nurse Practitioner

## 2014-05-30 ENCOUNTER — Other Ambulatory Visit (HOSPITAL_BASED_OUTPATIENT_CLINIC_OR_DEPARTMENT_OTHER): Payer: Medicare PPO

## 2014-05-30 ENCOUNTER — Ambulatory Visit (HOSPITAL_BASED_OUTPATIENT_CLINIC_OR_DEPARTMENT_OTHER): Payer: Medicare PPO | Admitting: Oncology

## 2014-05-30 VITALS — BP 154/46 | HR 60 | Temp 98.1°F | Resp 18 | Ht 68.0 in | Wt 236.2 lb

## 2014-05-30 DIAGNOSIS — D631 Anemia in chronic kidney disease: Secondary | ICD-10-CM

## 2014-05-30 DIAGNOSIS — N189 Chronic kidney disease, unspecified: Secondary | ICD-10-CM

## 2014-05-30 DIAGNOSIS — C250 Malignant neoplasm of head of pancreas: Secondary | ICD-10-CM | POA: Diagnosis not present

## 2014-05-30 DIAGNOSIS — N183 Chronic kidney disease, stage 3 unspecified: Secondary | ICD-10-CM

## 2014-05-30 DIAGNOSIS — M25512 Pain in left shoulder: Secondary | ICD-10-CM

## 2014-05-30 DIAGNOSIS — D649 Anemia, unspecified: Secondary | ICD-10-CM

## 2014-05-30 DIAGNOSIS — R630 Anorexia: Secondary | ICD-10-CM | POA: Diagnosis not present

## 2014-05-30 DIAGNOSIS — C259 Malignant neoplasm of pancreas, unspecified: Secondary | ICD-10-CM

## 2014-05-30 LAB — CBC WITH DIFFERENTIAL/PLATELET
BASO%: 0.3 % (ref 0.0–2.0)
Basophils Absolute: 0 10*3/uL (ref 0.0–0.1)
EOS ABS: 0.3 10*3/uL (ref 0.0–0.5)
EOS%: 4.9 % (ref 0.0–7.0)
HCT: 27.4 % — ABNORMAL LOW (ref 34.8–46.6)
HEMOGLOBIN: 8.7 g/dL — AB (ref 11.6–15.9)
LYMPH%: 27.4 % (ref 14.0–49.7)
MCH: 27.4 pg (ref 25.1–34.0)
MCHC: 31.8 g/dL (ref 31.5–36.0)
MCV: 86.4 fL (ref 79.5–101.0)
MONO#: 0.6 10*3/uL (ref 0.1–0.9)
MONO%: 9.5 % (ref 0.0–14.0)
NEUT#: 3.6 10*3/uL (ref 1.5–6.5)
NEUT%: 57.9 % (ref 38.4–76.8)
Platelets: 193 10*3/uL (ref 145–400)
RBC: 3.17 10*6/uL — ABNORMAL LOW (ref 3.70–5.45)
RDW: 16.3 % — AB (ref 11.2–14.5)
WBC: 6.1 10*3/uL (ref 3.9–10.3)
lymph#: 1.7 10*3/uL (ref 0.9–3.3)
nRBC: 0 % (ref 0–0)

## 2014-05-30 MED ORDER — EPOETIN ALFA 40000 UNIT/ML IJ SOLN
40000.0000 [IU] | INTRAMUSCULAR | Status: DC
  Administered 2014-05-30: 40000 [IU] via SUBCUTANEOUS
  Filled 2014-05-30: qty 1

## 2014-05-30 NOTE — Patient Instructions (Signed)

## 2014-05-30 NOTE — Telephone Encounter (Signed)
per pof ot [facility] pt appt-gave pt copy of sch

## 2014-05-30 NOTE — Progress Notes (Signed)
  [name] OFFICE PROGRESS NOTE   Diagnosis: Pancreas cancer  INTERVAL HISTORY:   [name] returns as scheduled. She continues to have pain at the left shoulder despite a sterile injection by orthopedics. She is scheduled for a follow-up orthopedic appointment. She  missed the last erythropoietin injection secondary to her daughter's wedding. The mass at the left groin and has not changed. No associated pain. She reports a poor appetite.  Objective:  Vital signs in last 24 hours:  Blood pressure 154/46, pulse 60, temperature 98.1 F (36.7 C), temperature source Oral, resp. rate 18, height 5\' 8"  (1.727 m), weight 236 lb 3.2 oz (107.14 kg), SpO2 100 %.  Lymphatics: Firm nodal mass in the left medial inguinal region Resp: Lungs clear bilaterally Cardio: Regular rate and rhythm GI: No hepatomegaly, nontender Vascular: Chronic stasis change at the lower leg bilaterally Musculoskeletal: Crepitance and pain with motion at the left shoulder     Lab Results:  Lab Results  Component Value Date   WBC 6.1 05/30/2014   HGB 8.7* 05/30/2014   HCT 27.4* 05/30/2014   MCV 86.4 05/30/2014   PLT 193 05/30/2014   NEUTROABS 3.6 05/30/2014    Medications: I have reviewed the patient's current medications.  Assessment/Plan: 1. Pancreas cancer.  Abdominal ultrasound 06/21/2013 with intrahepatic and extra hepatic biliary ductal dilatation. Pancreatic duct borderline dilated.   MRCP 06/26/2013 with severe intra and extrahepatic biliary ductal dilatation. The common bile duct abruptly terminated in the region of the superior aspect of the pancreatic head where there was a masslike area of amorphous soft tissue.   Endoscopic ultrasound 06/29/2013 with significant intra-and extrahepatic biliary ductal dilatation, pancreatic head mass that seemed to invade the portal confluence and superior mesenteric vein. A few scattered malignant appearing peripancreatic lymph nodes. EUS  staging T3 N1 Mx.   Biopsy of the pancreas head mass showed malignant cells consistent with adenocarcinoma.   Staging PET scan with a hypermetabolic pancreas head mass,. Precordial nodule, and enlarged hypermetabolic left inguinal node 2. 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 3. Hypertension. 4. Diabetes. 5. Heart failure. 6. Chronic kidney disease. 7. Anemia. 8. Hypothyroid. 9. Sleep apnea. 10. Asthma. 11. Left shoulder pain-likely secondary to osteoarthritis, CT 03/31/2014 confirmed severe osteoarthritis of the left glenohumeral joint   Disposition:  [name] will follow up with orthopedics for management of the left shoulder pain. I doubt this is cancer-related pain.  She appears to be slowly declining from the metastatic pancreas cancer. She has anorexia.  She will resume every three-week erythropoietin therapy for treatment of anemia related to chronic renal failure.  Betsy Coder, MD  05/30/2014  4:42 PM

## 2014-05-31 ENCOUNTER — Telehealth: Payer: Self-pay | Admitting: Nutrition

## 2014-05-31 NOTE — Telephone Encounter (Signed)
Patient referred by MD for nutrition follow up secondary to progressive weight loss due to poor appetite and decreased intake. Contacted patient by phone but she was unavailable.   Left my name and phone number for patient to return my call.

## 2014-06-09 ENCOUNTER — Other Ambulatory Visit: Payer: Self-pay | Admitting: *Deleted

## 2014-06-09 MED ORDER — LEVOTHYROXINE SODIUM 25 MCG PO TABS: 25.0000 ug | ORAL_TABLET | Freq: Every day | ORAL | Status: AC

## 2014-06-19 ENCOUNTER — Other Ambulatory Visit: Payer: Self-pay | Admitting: *Deleted

## 2014-06-19 DIAGNOSIS — IMO0002 Reserved for concepts with insufficient information to code with codable children: Secondary | ICD-10-CM

## 2014-06-19 DIAGNOSIS — E1165 Type 2 diabetes mellitus with hyperglycemia: Secondary | ICD-10-CM

## 2014-06-19 MED ORDER — ALLOPURINOL 100 MG PO TABS: 100.0000 mg | ORAL_TABLET | Freq: Every day | ORAL | Status: AC

## 2014-06-19 MED ORDER — INSULIN GLARGINE 100 UNIT/ML ~~LOC~~ SOLN: SUBCUTANEOUS | Status: DC

## 2014-06-20 ENCOUNTER — Encounter: Payer: Self-pay | Admitting: Nurse Practitioner

## 2014-06-20 ENCOUNTER — Ambulatory Visit (HOSPITAL_BASED_OUTPATIENT_CLINIC_OR_DEPARTMENT_OTHER): Payer: Medicare PPO | Admitting: Nurse Practitioner

## 2014-06-20 ENCOUNTER — Other Ambulatory Visit: Payer: Self-pay | Admitting: Nurse Practitioner

## 2014-06-20 ENCOUNTER — Ambulatory Visit (HOSPITAL_BASED_OUTPATIENT_CLINIC_OR_DEPARTMENT_OTHER): Payer: Medicare PPO

## 2014-06-20 ENCOUNTER — Other Ambulatory Visit (HOSPITAL_BASED_OUTPATIENT_CLINIC_OR_DEPARTMENT_OTHER): Payer: Medicare PPO

## 2014-06-20 VITALS — BP 123/41 | HR 78 | Temp 101.1°F | Resp 18

## 2014-06-20 VITALS — BP 123/41 | HR 78 | Temp 101.1°F

## 2014-06-20 DIAGNOSIS — N390 Urinary tract infection, site not specified: Secondary | ICD-10-CM

## 2014-06-20 DIAGNOSIS — C250 Malignant neoplasm of head of pancreas: Secondary | ICD-10-CM

## 2014-06-20 DIAGNOSIS — R509 Fever, unspecified: Secondary | ICD-10-CM | POA: Diagnosis not present

## 2014-06-20 DIAGNOSIS — D631 Anemia in chronic kidney disease: Secondary | ICD-10-CM

## 2014-06-20 DIAGNOSIS — C259 Malignant neoplasm of pancreas, unspecified: Secondary | ICD-10-CM

## 2014-06-20 DIAGNOSIS — N183 Chronic kidney disease, stage 3 (moderate): Principal | ICD-10-CM

## 2014-06-20 DIAGNOSIS — D649 Anemia, unspecified: Secondary | ICD-10-CM

## 2014-06-20 DIAGNOSIS — N189 Chronic kidney disease, unspecified: Secondary | ICD-10-CM

## 2014-06-20 DIAGNOSIS — D509 Iron deficiency anemia, unspecified: Secondary | ICD-10-CM

## 2014-06-20 DIAGNOSIS — R319 Hematuria, unspecified: Secondary | ICD-10-CM | POA: Diagnosis not present

## 2014-06-20 LAB — CBC WITH DIFFERENTIAL/PLATELET
BASO%: 0.1 % (ref 0.0–2.0)
Basophils Absolute: 0 10*3/uL (ref 0.0–0.1)
EOS ABS: 0.1 10*3/uL (ref 0.0–0.5)
EOS%: 1.3 % (ref 0.0–7.0)
HCT: 27.3 % — ABNORMAL LOW (ref 34.8–46.6)
HGB: 8.7 g/dL — ABNORMAL LOW (ref 11.6–15.9)
LYMPH#: 1.3 10*3/uL (ref 0.9–3.3)
LYMPH%: 12.7 % — AB (ref 14.0–49.7)
MCH: 27.4 pg (ref 25.1–34.0)
MCHC: 31.9 g/dL (ref 31.5–36.0)
MCV: 86.1 fL (ref 79.5–101.0)
MONO#: 0.7 10*3/uL (ref 0.1–0.9)
MONO%: 7.1 % (ref 0.0–14.0)
NEUT#: 8.1 10*3/uL — ABNORMAL HIGH (ref 1.5–6.5)
NEUT%: 78.8 % — ABNORMAL HIGH (ref 38.4–76.8)
NRBC: 0 % (ref 0–0)
PLATELETS: 202 10*3/uL (ref 145–400)
RBC: 3.17 10*6/uL — AB (ref 3.70–5.45)
RDW: 16.2 % — ABNORMAL HIGH (ref 11.2–14.5)
WBC: 10.3 10*3/uL (ref 3.9–10.3)

## 2014-06-20 LAB — URINALYSIS, MICROSCOPIC - CHCC
Bilirubin (Urine): NEGATIVE
GLUCOSE UR CHCC: NEGATIVE mg/dL
Ketones: NEGATIVE mg/dL
NITRITE: NEGATIVE
PH: 6 (ref 4.6–8.0)
PROTEIN: 100 mg/dL
SPECIFIC GRAVITY, URINE: 1.01 (ref 1.003–1.035)
Urobilinogen, UR: 0.2 mg/dL (ref 0.2–1)

## 2014-06-20 MED ORDER — EPOETIN ALFA 40000 UNIT/ML IJ SOLN
40000.0000 [IU] | INTRAMUSCULAR | Status: DC
  Administered 2014-06-20: 40000 [IU] via SUBCUTANEOUS
  Filled 2014-06-20: qty 1

## 2014-06-20 MED ORDER — CIPROFLOXACIN HCL 500 MG PO TABS: 500.0000 mg | ORAL_TABLET | Freq: Two times a day (BID) | ORAL | Status: DC

## 2014-06-20 NOTE — Assessment & Plan Note (Signed)
Patient states that she has been feeling increasingly fatigued for the past 24 hours; but denies any fevers or chills at home.  The patient presented to the [facility] today to receive her Procrit injection.  It was noted that her temperature was 101.1.  Patient denies any nausea, vomiting, diarrhea, or URI symptoms.  She states that she occasionally feels some pelvic floor pressure when she tries to urinate; but denies any dysuria, frequency, or hematuria.  On exam.-Patient appears nontoxic.  Urine obtained today revealed a small amount of hematuria, nitrite negative, trace leukocytes, WBC 3-6, and a few bacteria.  Urine culture results pending.  Will prescribe Cipro antibiotics for questionable mild UTI.  Patient was advised to call/return to go directly to the emergency department for any worsening symptoms whatsoever.

## 2014-06-20 NOTE — Assessment & Plan Note (Signed)
Patient continues with observation only/Comfort care for her pancreatic cancer diagnosis.

## 2014-06-20 NOTE — Progress Notes (Signed)
SYMPTOM MANAGEMENT CLINIC   HPI: [name] 81 y.o. female diagnosed with pancreatic cancer and chronic anemia.  Undergoing observation only for her pancreatic cancer.  Undergoing Procrit injections for her chronic anemia.  Patient states that she has been feeling increasingly fatigued for the past 24 hours; but denies any fevers or chills at home.  The patient presented to the [facility] today to receive her Procrit injection.  It was noted that her temperature was 101.1.  Patient denies any nausea, vomiting, diarrhea, or URI symptoms.  She states that she occasionally feels some pelvic floor pressure when she tries to urinate; but denies any dysuria, frequency, or hematuria.  HPI  ROS  Past Medical History  Diagnosis Date  . Hypertension   . Heart failure     sees dr Iona Coach at [facility] every 6 months  . Asthma   . Diabetes mellitus, type 2   . Chronic kidney disease     sees dr Lorrene Reid nephrology every 4 months  . Anemia     on iron  . Sleep apnea     cpap setting of 2  . Cancer     pancreatic     Past Surgical History  Procedure Laterality Date  . Cholecystectomy  1973  . Total abdominal hysterectomy  1982  . Tubal ligation  1973  . Breast reduction surgery  2002  . Eus N/A 06/29/2013    Procedure: ESOPHAGEAL ENDOSCOPIC ULTRASOUND (EUS) RADIAL;  Surgeon: Arta Silence, MD;  Location: [facility];  Service: Endoscopy;  Laterality: N/A;  . Ercp N/A 06/29/2013    Procedure: ENDOSCOPIC RETROGRADE CHOLANGIOPANCREATOGRAPHY (ERCP);  Surgeon: Arta Silence, MD;  Location: [facility];  Service: Endoscopy;  Laterality: N/A;    has Type II or unspecified type diabetes mellitus without mention of complication, uncontrolled; DYSLIPIDEMIA; Morbid obesity; Anemia; HYPERTENSION; AORTIC VALVE DISORDERS; CAROTID STENOSIS; COPD (chronic obstructive pulmonary disease); RENAL INSUFFICIENCY; SLEEP APNEA; Chronic kidney disease, stage III (moderate); Anemia of chronic kidney  failure; GERD (gastroesophageal reflux disease); Unspecified hypothyroidism; Malignant neoplasm of pancreas; and UTI (urinary tract infection) on her problem list.    is allergic to percocet; codeine; fish allergy; iodine; and zantac.    Medication List       This list is accurate as of: 06/20/14  4:41 PM.  Always use your most recent med list.               ACCU-CHEK AVIVA PLUS W/DEVICE Kit  Use to check blood sugar 3 times per day dx code 250.02     ACCU-CHEK FASTCLIX LANCETS Misc  Use to obtain blood specimen 3 times per day     ADVAIR DISKUS 250-50 MCG/DOSE Aepb  Generic drug:  Fluticasone-Salmeterol  Inhale 1 puff into the lungs every 12 (twelve) hours.     albuterol 108 (90 BASE) MCG/ACT inhaler  Commonly known as:  PROVENTIL HFA;VENTOLIN HFA  Inhale 2 puffs into the lungs every 6 (six) hours as needed for wheezing or shortness of breath.     allopurinol 100 MG tablet  Commonly known as:  ZYLOPRIM  Take 1 tablet (100 mg total) by mouth daily.     aspirin EC 81 MG tablet  Take 81 mg by mouth daily.     cholecalciferol 1000 UNITS tablet  Commonly known as:  VITAMIN D  Take 1,000 Units by mouth daily.     ciprofloxacin 500 MG tablet  Commonly known as:  CIPRO  Take 1 tablet (500 mg total) by  mouth 2 (two) times daily.     diltiazem 300 MG 24 hr capsule  Commonly known as:  CARDIZEM CD  Take 300 mg by mouth at bedtime.     docusate sodium 100 MG capsule  Commonly known as:  COLACE  Take 100 mg by mouth 2 (two) times daily as needed for mild constipation.     fenofibrate micronized 43 MG capsule  Commonly known as:  ANTARA  Take 1 capsule (43 mg total) by mouth daily before breakfast.     ferrous sulfate 325 (65 FE) MG tablet  Take 325 mg by mouth daily.     furosemide 40 MG tablet  Commonly known as:  LASIX     glucose blood test strip  Commonly known as:  ACCU-CHEK AVIVA PLUS  Use as instructed to check blood sugar 3 times per day dx code E11.65      insulin aspart 100 UNIT/ML injection  Commonly known as:  novoLOG  Inject 4-18 Units into the skin 3 (three) times daily with meals. 4 units before breakfast, 6 units before lunch, 18 units before supper     insulin glargine 100 UNIT/ML injection  Commonly known as:  LANTUS  Inject 38 units Qam     Insulin Syringe-Needle U-100 31G X 5/16" 1 ML Misc  Commonly known as:  B-D INS SYR ULTRAFINE 1CC/31G  Inject 1 each into the skin 3 (three) times daily.     levothyroxine 25 MCG tablet  Commonly known as:  SYNTHROID, LEVOTHROID  Take 1 tablet (25 mcg total) by mouth at bedtime.     losartan 100 MG tablet  Commonly known as:  COZAAR  Take 100 mg by mouth every morning.     montelukast 10 MG tablet  Commonly known as:  SINGULAIR  Take 10 mg by mouth at bedtime.     omeprazole 20 MG capsule  Commonly known as:  PRILOSEC  Take 20 mg by mouth every morning.     polyethylene glycol packet  Commonly known as:  MIRALAX / GLYCOLAX  Take 17 g by mouth daily as needed for mild constipation.     pravastatin 20 MG tablet  Commonly known as:  PRAVACHOL  Take 20 mg by mouth daily.     PROCRIT IJ  Inject 40,000 Units as directed every 21 ( twenty-one) days.         PHYSICAL EXAMINATION  Oncology Vitals 06/20/2014 06/20/2014 05/30/2014 04/24/2014 04/18/2014 03/21/2014 02/28/2014  Height - - 173 cm 173 cm - 173 cm -  Weight - - 107.14 kg 111.857 kg - 114.896 kg -  Weight (lbs) - - 236 lbs 3 oz 246 lbs 10 oz - 253 lbs 5 oz -  BMI (kg/m2) - - 35.91 kg/m2 37.5 kg/m2 - 38.51 kg/m2 -  Temp 101.1 101.1 98.1 98.4 98.4 97.9 98  Pulse 78 78 60 50 50 43 50  Resp 18 - 18 16 - 20 -  Resp (Historical as of 08/07/11) - - - - - - -  SpO2 100 - 100 97 - - -  BSA (m2) - - 2.27 m2 2.32 m2 - 2.35 m2 -   BP Readings from Last 3 Encounters:  06/20/14 123/41  06/20/14 123/41  05/30/14 154/46    Physical Exam  Constitutional: She is oriented to person, place, and time and well-developed, well-nourished, and  in no distress.  HENT:  Head: Normocephalic and atraumatic.  Eyes: Conjunctivae and EOM are normal. Pupils are equal, round, and reactive  to light. Right eye exhibits no discharge. Left eye exhibits no discharge. No scleral icterus.  Neck: Normal range of motion.  Cardiovascular: Normal rate.   Pulmonary/Chest: Effort normal. No respiratory distress.  Abdominal: Soft. She exhibits no distension and no mass. There is no tenderness. There is no rebound and no guarding.  Musculoskeletal: Normal range of motion.  Neurological: She is alert and oriented to person, place, and time.  Skin: Skin is warm and dry.  Psychiatric: Affect normal.  Nursing note and vitals reviewed.   LABORATORY DATA:. Appointment on 06/20/2014  Component Date Value Ref Range Status  . WBC 06/20/2014 10.3  3.9 - 10.3 10e3/uL Final  . NEUT# 06/20/2014 8.1* 1.5 - 6.5 10e3/uL Final  . HGB 06/20/2014 8.7* 11.6 - 15.9 g/dL Final  . HCT 06/20/2014 27.3* 34.8 - 46.6 % Final  . Platelets 06/20/2014 202  145 - 400 10e3/uL Final  . MCV 06/20/2014 86.1  79.5 - 101.0 fL Final  . MCH 06/20/2014 27.4  25.1 - 34.0 pg Final  . MCHC 06/20/2014 31.9  31.5 - 36.0 g/dL Final  . RBC 06/20/2014 3.17* 3.70 - 5.45 10e6/uL Final  . RDW 06/20/2014 16.2* 11.2 - 14.5 % Final  . lymph# 06/20/2014 1.3  0.9 - 3.3 10e3/uL Final  . MONO# 06/20/2014 0.7  0.1 - 0.9 10e3/uL Final  . Eosinophils Absolute 06/20/2014 0.1  0.0 - 0.5 10e3/uL Final  . Basophils Absolute 06/20/2014 0.0  0.0 - 0.1 10e3/uL Final  . NEUT% 06/20/2014 78.8* 38.4 - 76.8 % Final  . LYMPH% 06/20/2014 12.7* 14.0 - 49.7 % Final  . MONO% 06/20/2014 7.1  0.0 - 14.0 % Final  . EOS% 06/20/2014 1.3  0.0 - 7.0 % Final  . BASO% 06/20/2014 0.1  0.0 - 2.0 % Final  . nRBC 06/20/2014 0  0 - 0 % Final  . Glucose 06/20/2014 Negative  Negative mg/dL Final  . Bilirubin (Urine) 06/20/2014 Negative  Negative Final  . Ketones 06/20/2014 Negative  Negative mg/dL Final  . Specific Gravity, Urine  06/20/2014 1.010  1.003 - 1.035 Final  . Blood 06/20/2014 Small  Negative Final  . pH 06/20/2014 6.0  4.6 - 8.0 Final  . Protein 06/20/2014 100  Negative- <30 mg/dL Final  . Urobilinogen, UR 06/20/2014 0.2  0.2 - 1 mg/dL Final  . Nitrite 06/20/2014 Negative  Negative Final  . Leukocyte Esterase 06/20/2014 Trace  Negative Final  . RBC / HPF 06/20/2014 3-6  0 - 2 Final  . WBC, UA 06/20/2014 3-6  0 - 2 Final  . Bacteria, UA 06/20/2014 Few  Negative- Trace Final  . Casts 06/20/2014 Few Hyaline  Negative Final  . Epithelial Cells 06/20/2014 Occasional  Negative- Few Final     RADIOGRAPHIC STUDIES: No results found.  ASSESSMENT/PLAN:    Anemia Patient received chronic Procrit injections on an every three-week basis for treatment of her anemia.  Hemoglobin today, though; but stable at 8.7.  Patient did receive her Procrit injection today.  Patient has plans to return on 07/11/2014 for her next labs and Procrit injection.  Malignant neoplasm of pancreas Patient continues with observation only/Comfort care for her pancreatic cancer diagnosis.  UTI (urinary tract infection) Patient states that she has been feeling increasingly fatigued for the past 24 hours; but denies any fevers or chills at home.  The patient presented to the [facility] today to receive her Procrit injection.  It was noted that her temperature was 101.1.  Patient denies any nausea, vomiting,  diarrhea, or URI symptoms.  She states that she occasionally feels some pelvic floor pressure when she tries to urinate; but denies any dysuria, frequency, or hematuria.  On exam.-Patient appears nontoxic.  Urine obtained today revealed a small amount of hematuria, nitrite negative, trace leukocytes, WBC 3-6, and a few bacteria.  Urine culture results pending.  Will prescribe Cipro antibiotics for questionable mild UTI.  Patient was advised to call/return to go directly to the emergency department for any worsening symptoms  whatsoever.  Patient stated understanding of all instructions; and was in agreement with this plan of care. The patient knows to call the clinic with any problems, questions or concerns.   Review/collaboration with Dr. Benay Spice regarding all aspects of patient's visit today.   Total time spent with patient was 25 minutes;  with greater than 75 percent of that time spent in face to face counseling regarding patient's symptoms,  and coordination of care and follow up.  Disclaimer: This note was dictated with voice recognition software. Similar sounding words can inadvertently be transcribed and may not be corrected upon review.   Drue Second, NP 06/20/2014

## 2014-06-20 NOTE — Assessment & Plan Note (Signed)
Patient received chronic Procrit injections on an every three-week basis for treatment of her anemia.  Hemoglobin today, though; but stable at 8.7.  Patient did receive her Procrit injection today.  Patient has plans to return on 07/11/2014 for her next labs and Procrit injection.

## 2014-06-21 ENCOUNTER — Telehealth: Payer: Self-pay | Admitting: *Deleted

## 2014-06-21 NOTE — Telephone Encounter (Signed)
TC to [name]- she is feeling better since yesterday's visit. She has only taken 2 abx and her symptoms have improved as far as she can tell. [name] does not have a thermometer to check her temp but does not feel feverish. [name] understands to call clinic if symptoms stop improving or if she gets worse. She has been advised to get a thermometer for home use.

## 2014-06-22 LAB — URINE CULTURE

## 2014-06-26 ENCOUNTER — Other Ambulatory Visit: Payer: Self-pay | Admitting: *Deleted

## 2014-06-26 MED ORDER — FENOFIBRATE MICRONIZED 43 MG PO CAPS: 43.0000 mg | ORAL_CAPSULE | Freq: Every day | ORAL | Status: DC

## 2014-07-07 ENCOUNTER — Other Ambulatory Visit: Payer: Self-pay | Admitting: *Deleted

## 2014-07-07 DIAGNOSIS — N183 Chronic kidney disease, stage 3 (moderate): Secondary | ICD-10-CM

## 2014-07-07 DIAGNOSIS — D631 Anemia in chronic kidney disease: Secondary | ICD-10-CM

## 2014-07-07 DIAGNOSIS — C259 Malignant neoplasm of pancreas, unspecified: Secondary | ICD-10-CM

## 2014-07-11 ENCOUNTER — Other Ambulatory Visit (HOSPITAL_BASED_OUTPATIENT_CLINIC_OR_DEPARTMENT_OTHER): Payer: Medicare PPO

## 2014-07-11 ENCOUNTER — Ambulatory Visit (HOSPITAL_BASED_OUTPATIENT_CLINIC_OR_DEPARTMENT_OTHER): Payer: Medicare PPO

## 2014-07-11 VITALS — BP 140/68 | HR 70 | Temp 98.3°F

## 2014-07-11 DIAGNOSIS — N183 Chronic kidney disease, stage 3 unspecified: Secondary | ICD-10-CM

## 2014-07-11 DIAGNOSIS — N189 Chronic kidney disease, unspecified: Secondary | ICD-10-CM

## 2014-07-11 DIAGNOSIS — C259 Malignant neoplasm of pancreas, unspecified: Secondary | ICD-10-CM

## 2014-07-11 DIAGNOSIS — D631 Anemia in chronic kidney disease: Secondary | ICD-10-CM

## 2014-07-11 LAB — CBC WITH DIFFERENTIAL/PLATELET
BASO%: 0.7 % (ref 0.0–2.0)
BASOS ABS: 0 10*3/uL (ref 0.0–0.1)
EOS%: 2.9 % (ref 0.0–7.0)
Eosinophils Absolute: 0.2 10*3/uL (ref 0.0–0.5)
HEMATOCRIT: 31.2 % — AB (ref 34.8–46.6)
HEMOGLOBIN: 10 g/dL — AB (ref 11.6–15.9)
LYMPH#: 1.1 10*3/uL (ref 0.9–3.3)
LYMPH%: 17.9 % (ref 14.0–49.7)
MCH: 27.4 pg (ref 25.1–34.0)
MCHC: 31.9 g/dL (ref 31.5–36.0)
MCV: 85.9 fL (ref 79.5–101.0)
MONO#: 0.4 10*3/uL (ref 0.1–0.9)
MONO%: 6.4 % (ref 0.0–14.0)
NEUT#: 4.3 10*3/uL (ref 1.5–6.5)
NEUT%: 72.1 % (ref 38.4–76.8)
Platelets: 272 10*3/uL (ref 145–400)
RBC: 3.63 10*6/uL — ABNORMAL LOW (ref 3.70–5.45)
RDW: 16.4 % — ABNORMAL HIGH (ref 11.2–14.5)
WBC: 6 10*3/uL (ref 3.9–10.3)

## 2014-07-11 MED ORDER — EPOETIN ALFA 40000 UNIT/ML IJ SOLN
40000.0000 [IU] | INTRAMUSCULAR | Status: DC
  Administered 2014-07-11: 40000 [IU] via SUBCUTANEOUS
  Filled 2014-07-11: qty 1

## 2014-07-12 ENCOUNTER — Telehealth: Payer: Self-pay | Admitting: *Deleted

## 2014-07-12 ENCOUNTER — Telehealth: Payer: Self-pay | Admitting: Endocrinology

## 2014-07-12 NOTE — Telephone Encounter (Signed)
Daughter calling requesting a call back for help with blood sugars dropping low and modifying her dosing as the cancer progresses

## 2014-07-12 NOTE — Telephone Encounter (Signed)
Patients daughter called, she's concerned about her moms blood sugars dropping low, last night it went down to 41 and they had to call ems who was able to get it back up.  This morning it was 54, she said they have been running in the low 70's usually. She is taking 38 units of lantus at night and the daughter said her moms oncologist instructed them that her insulin would need to be adjusted as her cancer progresses. Please advise.

## 2014-07-12 NOTE — Telephone Encounter (Signed)
Noted, daughter is aware 

## 2014-07-12 NOTE — Telephone Encounter (Signed)
She was supposed to be taking only 32 units of Lantus instead of 38.  For now she can reduce the dose to 26 units and keep an eye on the sugar until next visit

## 2014-07-20 ENCOUNTER — Other Ambulatory Visit: Payer: Medicare PPO

## 2014-07-24 ENCOUNTER — Ambulatory Visit: Payer: Medicare PPO | Admitting: Endocrinology

## 2014-07-27 ENCOUNTER — Encounter: Payer: Self-pay | Admitting: Podiatry

## 2014-07-27 ENCOUNTER — Ambulatory Visit (INDEPENDENT_AMBULATORY_CARE_PROVIDER_SITE_OTHER): Payer: Medicare PPO | Admitting: Podiatry

## 2014-07-27 DIAGNOSIS — B351 Tinea unguium: Secondary | ICD-10-CM | POA: Diagnosis not present

## 2014-07-27 DIAGNOSIS — M79676 Pain in unspecified toe(s): Secondary | ICD-10-CM | POA: Diagnosis not present

## 2014-07-27 DIAGNOSIS — E1140 Type 2 diabetes mellitus with diabetic neuropathy, unspecified: Secondary | ICD-10-CM

## 2014-07-27 DIAGNOSIS — Q828 Other specified congenital malformations of skin: Secondary | ICD-10-CM

## 2014-07-27 NOTE — Progress Notes (Signed)
Patient ID: [name], female   DOB: [birth year], 81 y.o.   MRN: [id number] Complaint:  Visit Type: Patient returns to my office for continued preventative foot care services. Complaint: Patient states" my nails have grown long and thick and become painful to walk and wear shoes" Patient has been diagnosed with DM with no foot complications. The patient presents for preventative foot care services. No changes to ROS.  Calluses noted on bottom of both feet.    Podiatric Exam: Vascular: dorsalis pedis and posterior tibial pulses are palpable bilateral. Capillary return is immediate. Temperature gradient is WNL. Skin turgor WNL  Sensorium: Normal Semmes Weinstein monofilament test. Normal tactile sensation bilaterally. Nail Exam: Pt has thick disfigured discolored nails with subungual debris noted bilateral entire nail hallux through fifth toenails Ulcer Exam: There is no evidence of ulcer or pre-ulcerative changes or infection. Orthopedic Exam: Muscle tone and strength are WNL. No limitations in general ROM. No crepitus or effusions noted. Foot type and digits show no abnormalities. Bony prominences are unremarkable. Skin:  Porokeratosis noted on left heel and lateral to fifth metabase right foot.. No infection or ulcers  Diagnosis:  Onychomycosis, , Pain in right toe, pain in left toes, Porokeratosis B/L  Treatment & Plan Procedures and Treatment: Consent by patient was obtained for treatment procedures. The patient understood the discussion of treatment and procedures well. All questions were answered thoroughly reviewed. Debridement of mycotic and hypertrophic toenails, 1 through 5 bilateral and clearing of subungual debris. No ulceration, no infection noted. Debride porokeratosis B/L Return Visit-Office Procedure: Patient instructed to return to the office for a follow up visit 3 months for continued evaluation and treatment.

## 2014-07-31 ENCOUNTER — Other Ambulatory Visit: Payer: Self-pay | Admitting: *Deleted

## 2014-07-31 DIAGNOSIS — N183 Chronic kidney disease, stage 3 (moderate): Secondary | ICD-10-CM

## 2014-07-31 DIAGNOSIS — C259 Malignant neoplasm of pancreas, unspecified: Secondary | ICD-10-CM

## 2014-07-31 DIAGNOSIS — D631 Anemia in chronic kidney disease: Secondary | ICD-10-CM

## 2014-08-01 ENCOUNTER — Ambulatory Visit: Payer: Medicare PPO

## 2014-08-01 ENCOUNTER — Other Ambulatory Visit: Payer: Medicare PPO

## 2014-08-01 ENCOUNTER — Telehealth: Payer: Self-pay | Admitting: *Deleted

## 2014-08-01 NOTE — Telephone Encounter (Signed)
Called patient and spoke to [name](her husband).  He states that [name] is in [place] at her daughter's home.  Her condition is that her daughter must take care of her.  He gave me her daughters number--[phone].  I will call them about this.  Patient answered and said that she called and cancelled appt on 07/29/14.  She will come for next scheduled appointments in 3 weeks.

## 2014-08-11 ENCOUNTER — Other Ambulatory Visit (INDEPENDENT_AMBULATORY_CARE_PROVIDER_SITE_OTHER): Payer: Medicare Other

## 2014-08-11 DIAGNOSIS — E1165 Type 2 diabetes mellitus with hyperglycemia: Secondary | ICD-10-CM | POA: Diagnosis not present

## 2014-08-11 DIAGNOSIS — IMO0002 Reserved for concepts with insufficient information to code with codable children: Secondary | ICD-10-CM

## 2014-08-11 DIAGNOSIS — E039 Hypothyroidism, unspecified: Secondary | ICD-10-CM | POA: Diagnosis not present

## 2014-08-11 LAB — BASIC METABOLIC PANEL
BUN: 34 mg/dL — ABNORMAL HIGH (ref 6–23)
CO2: 25 mEq/L (ref 19–32)
Calcium: 8.7 mg/dL (ref 8.4–10.5)
Chloride: 110 mEq/L (ref 96–112)
Creatinine, Ser: 1.66 mg/dL — ABNORMAL HIGH (ref 0.40–1.20)
GFR: 38.23 mL/min — ABNORMAL LOW (ref >60.00)
Glucose, Bld: 88 mg/dL (ref 70–99)
Potassium: 3.6 mEq/L (ref 3.5–5.1)
SODIUM: 142 meq/L (ref 135–145)

## 2014-08-11 LAB — HEMOGLOBIN A1C: HEMOGLOBIN A1C: 5.2 % (ref 4.6–6.5)

## 2014-08-11 LAB — TSH: TSH: 1.95 u[IU]/mL (ref 0.35–4.50)

## 2014-08-16 ENCOUNTER — Ambulatory Visit (INDEPENDENT_AMBULATORY_CARE_PROVIDER_SITE_OTHER): Payer: Medicare PPO | Admitting: Endocrinology

## 2014-08-16 ENCOUNTER — Encounter: Payer: Self-pay | Admitting: Endocrinology

## 2014-08-16 VITALS — BP 128/66 | HR 79 | Temp 98.0°F | Resp 16 | Ht 68.0 in | Wt 214.4 lb

## 2014-08-16 DIAGNOSIS — E119 Type 2 diabetes mellitus without complications: Secondary | ICD-10-CM

## 2014-08-16 NOTE — Patient Instructions (Signed)
Stop Lantus  Check sugar randomly once a day, call if staying over 200

## 2014-08-16 NOTE — Progress Notes (Signed)
Patient ID: [name], female   DOB: 05-20-1932, 81 y.o.   MRN: [id number]   Reason for Appointment: Diabetes follow-up    History of Present Illness   Diagnosis: Type 2 DIABETES MELITUS, date of diagnosis:  1997  Past history: She was started on insulin within a year of diagnosis; had diarrhea from metformin and swelling from Actos Generally her blood sugars are fairly well-controlled with basal bolus insulin regimen and A1c mostly below 7 although this is usually not representative of her actual blood sugars because of her renal dysfunction and anemia  RECENT history:   Insulin regimen: Lantus 12 in am       She has had relatively lower blood sugars over the last few months related to her weight loss and decreased appetite from her pancreatic cancer Her Lantus insulin dose has been reduced progressively and she on her own has cut down the dose to only 12 units now compared to 35 units on her last visit in April.  She thinks she reduced her insulin recently about last Friday   Her A1c is lower at 5.2 although her blood sugars are averaging 109 recently  She is sometimes eating a small mea  But otherwise she will drink boost or Ensure  Current blood sugar readings show fairly good readings throughout the day even after meals Her highest readings recently has been about 160-170 sporadically in the morning or later in the day. Even with her skipping insulin for a day or 2 her sugars do not seem to be going much higher  Hypoglycemia: Blood sugars have been low twice in the last month as above  Monitors blood glucose:  1-2 times a day.    Glucometer: Accu-Chek  Recent blood sugar range 68-170 and average for the last month 113       Meals:  1-2 meals per day.  breakfast: Getting yogurt or boost Physical activity:  unable to do much            Dietician visit: Most recent: 1/94          Complications: are: ? Nephropathy, last microalbumin/ creatinine ratio was 87 in 2/14     Wt Readings from Last 3 Encounters:  08/16/14 214 lb 6.4 oz (97.251 kg)  05/30/14 236 lb 3.2 oz (107.14 kg)  04/24/14 246 lb 9.6 oz (111.857 kg)   Lab Results  Component Value Date   HGBA1C 5.2 08/11/2014   HGBA1C 6.0 01/18/2014   HGBA1C 4.6 08/29/2013   Lab Results  Component Value Date   MICROALBUR 209.5* 05/31/2013   LDLCALC 130* 05/31/2013   CREATININE 1.66* 08/11/2014         Medication List       This list is accurate as of: 08/16/14  9:00 PM.  Always use your most recent med list.               ACCU-CHEK AVIVA PLUS W/DEVICE Kit  Use to check blood sugar 3 times per day dx code 250.02     ACCU-CHEK FASTCLIX LANCETS Misc  Use to obtain blood specimen 3 times per day     ADVAIR DISKUS 250-50 MCG/DOSE Aepb  Generic drug:  Fluticasone-Salmeterol  Inhale 1 puff into the lungs every 12 (twelve) hours.     albuterol 108 (90 BASE) MCG/ACT inhaler  Commonly known as:  PROVENTIL HFA;VENTOLIN HFA  Inhale 2 puffs into the lungs every 6 (six) hours as needed for wheezing or shortness of breath.  allopurinol 100 MG tablet  Commonly known as:  ZYLOPRIM  Take 1 tablet (100 mg total) by mouth daily.     aspirin EC 81 MG tablet  Take 81 mg by mouth daily.     cholecalciferol 1000 UNITS tablet  Commonly known as:  VITAMIN D  Take 1,000 Units by mouth daily.     ciprofloxacin 500 MG tablet  Commonly known as:  CIPRO  Take 1 tablet (500 mg total) by mouth 2 (two) times daily.     diltiazem 300 MG 24 hr capsule  Commonly known as:  CARDIZEM CD  Take 300 mg by mouth at bedtime.     docusate sodium 100 MG capsule  Commonly known as:  COLACE  Take 100 mg by mouth 2 (two) times daily as needed for mild constipation.     fenofibrate micronized 43 MG capsule  Commonly known as:  ANTARA  Take 1 capsule (43 mg total) by mouth daily before breakfast.     ferrous sulfate 325 (65 FE) MG tablet  Take 325 mg by mouth daily.     furosemide 40 MG tablet  Commonly  known as:  LASIX     glucose blood test strip  Commonly known as:  ACCU-CHEK AVIVA PLUS  Use as instructed to check blood sugar 3 times per day dx code E11.65     insulin aspart 100 UNIT/ML injection  Commonly known as:  novoLOG  Inject 4-18 Units into the skin 3 (three) times daily with meals. 4 units before breakfast, 6 units before lunch, 18 units before supper     insulin glargine 100 UNIT/ML injection  Commonly known as:  LANTUS  Inject 38 units Qam     Insulin Syringe-Needle U-100 31G X 5/16" 1 ML Misc  Commonly known as:  B-D INS SYR ULTRAFINE 1CC/31G  Inject 1 each into the skin 3 (three) times daily.     levothyroxine 25 MCG tablet  Commonly known as:  SYNTHROID, LEVOTHROID  Take 1 tablet (25 mcg total) by mouth at bedtime.     losartan 100 MG tablet  Commonly known as:  COZAAR  Take 100 mg by mouth every morning.     montelukast 10 MG tablet  Commonly known as:  SINGULAIR  Take 10 mg by mouth at bedtime.     morphine 15 MG 12 hr tablet  Commonly known as:  MS CONTIN  Take 15 mg by mouth every 12 (twelve) hours.     omeprazole 20 MG capsule  Commonly known as:  PRILOSEC  Take 20 mg by mouth every morning.     polyethylene glycol packet  Commonly known as:  MIRALAX / GLYCOLAX  Take 17 g by mouth daily as needed for mild constipation.     pravastatin 20 MG tablet  Commonly known as:  PRAVACHOL  Take 20 mg by mouth daily.     PROCRIT IJ  Inject 40,000 Units as directed every 21 ( twenty-one) days.     promethazine 25 MG tablet  Commonly known as:  PHENERGAN  Take 25 mg by mouth every 6 (six) hours as needed for nausea or vomiting.     traMADol 50 MG tablet  Commonly known as:  ULTRAM  Take by mouth as needed.        Allergies:  Allergies  Allergen Reactions  . Percocet [Oxycodone-Acetaminophen] Nausea And Vomiting  . Codeine     Felt like "my head was going to explode"  . Fish Allergy Itching  .  Iodine Itching  . Zantac [Ranitidine Hcl] Nausea  And Vomiting    Past Medical History  Diagnosis Date  . Hypertension   . Heart failure     sees dr Iona Coach at [facility] every 6 months  . Asthma   . Diabetes mellitus, type 2   . Chronic kidney disease     sees dr Lorrene Reid nephrology every 4 months  . Anemia     on iron  . Sleep apnea     cpap setting of 2  . Cancer     pancreatic     Past Surgical History  Procedure Laterality Date  . Cholecystectomy  1973  . Total abdominal hysterectomy  1982  . Tubal ligation  1973  . Breast reduction surgery  2002  . Eus N/A 06/29/2013    Procedure: ESOPHAGEAL ENDOSCOPIC ULTRASOUND (EUS) RADIAL;  Surgeon: Arta Silence, MD;  Location: [facility];  Service: Endoscopy;  Laterality: N/A;  . Ercp N/A 06/29/2013    Procedure: ENDOSCOPIC RETROGRADE CHOLANGIOPANCREATOGRAPHY (ERCP);  Surgeon: Arta Silence, MD;  Location: [facility];  Service: Endoscopy;  Laterality: N/A;    Family History  Problem Relation Age of Onset  . Asthma Sister   . Asthma Mother   . Heart disease Father   . Cancer Sister     breast cancer  . Cancer Brother     kidney    Social History:  reports that she quit smoking about 50 years ago. Her smoking use included Cigarettes. She has a 2.7 pack-year smoking history. She has never used smokeless tobacco. She reports that she does not drink alcohol or use illicit drugs.  Review of Systems:  She has  pancreatic cancer and is opting for no treatment, no abdominal pain recently but has continued weight loss   HYPERTENSION:  long-standing and well controlled with current regimen   HYPOTHYROID: TSH has been normal with taking small dose  of 25 mcg levothyroxine.   Lab Results  Component Value Date   TSH 1.95 08/11/2014    Chronic kidney disease: Followed by nephrologist periodically  Creatinine is recently relatively high at 2.0 [facility]  Lab Results  Component Value Date   CREATININE 1.66* 08/11/2014     Has been getting Aranesp for her anemia  which is followed by a hematologist  Lab Results  Component Value Date   WBC 6.0 07/11/2014   HGB 10.0* 07/11/2014   HCT 31.2* 07/11/2014   MCV 85.9 07/11/2014   PLT 272 07/11/2014     HYPERLIPIDEMIA: The lipid abnormality consists mostly of high triglycerides and also high LDL She is on low-dose fenofibrate and also pravastatin 20 mg , last lipid results were relatively high   Lab Results  Component Value Date   CHOL 203* 05/31/2013   HDL 30.70* 05/31/2013   LDLCALC 130* 05/31/2013   TRIG 212.0* 05/31/2013   CHOLHDL 7 05/31/2013        Examination:   BP 128/66 mmHg  Pulse 79  Temp(Src) 98 F (36.7 C)  Resp 16  Ht 5' 8"  (1.727 m)  Wt 214 lb 6.4 oz (97.251 kg)  BMI 32.61 kg/m2  SpO2 99%  Body mass index is 32.61 kg/(m^2).     ASSESSMENT/ PLAN:   Diabetes type 2   The patient's glucose readings are relatively well controlled even with taking only 12 units Lantus once a day Because of her decreased appetite and significant weight loss she is requiring progressively less insulin She does not usually even  have postprandial hyperglycemia Her blood sugars are only minimally increased at times when she has not taken her insulin Her A1c of 5.2 indicates overall relatively low blood sugars   Given her diagnosis and likely progression of her pancreatic tumor she does not need to be on insulin now. She can monitor her blood sugars periodically and call if persistently high, consider trying glipizide ER if blood sugars are consistently over 200  HYPERTENSION: Well controlled  Renal function is stable   Patient Instructions  Stop Lantus  Check sugar randomly once a day, call if staying over [place] 08/16/2014, 9:00 PM

## 2014-08-22 ENCOUNTER — Other Ambulatory Visit (HOSPITAL_BASED_OUTPATIENT_CLINIC_OR_DEPARTMENT_OTHER): Payer: Medicare PPO

## 2014-08-22 ENCOUNTER — Ambulatory Visit: Payer: Medicare PPO

## 2014-08-22 ENCOUNTER — Ambulatory Visit (HOSPITAL_BASED_OUTPATIENT_CLINIC_OR_DEPARTMENT_OTHER): Admitting: Nurse Practitioner

## 2014-08-22 ENCOUNTER — Ambulatory Visit (HOSPITAL_BASED_OUTPATIENT_CLINIC_OR_DEPARTMENT_OTHER): Payer: Medicare Other

## 2014-08-22 ENCOUNTER — Telehealth: Payer: Self-pay | Admitting: Nurse Practitioner

## 2014-08-22 VITALS — BP 137/48 | HR 74 | Temp 98.6°F | Resp 18 | Ht 68.0 in | Wt 209.9 lb

## 2014-08-22 DIAGNOSIS — N183 Chronic kidney disease, stage 3 unspecified: Secondary | ICD-10-CM

## 2014-08-22 DIAGNOSIS — C250 Malignant neoplasm of head of pancreas: Secondary | ICD-10-CM

## 2014-08-22 DIAGNOSIS — N189 Chronic kidney disease, unspecified: Secondary | ICD-10-CM

## 2014-08-22 DIAGNOSIS — I509 Heart failure, unspecified: Secondary | ICD-10-CM | POA: Diagnosis not present

## 2014-08-22 DIAGNOSIS — M25512 Pain in left shoulder: Secondary | ICD-10-CM

## 2014-08-22 DIAGNOSIS — E039 Hypothyroidism, unspecified: Secondary | ICD-10-CM

## 2014-08-22 DIAGNOSIS — C259 Malignant neoplasm of pancreas, unspecified: Secondary | ICD-10-CM

## 2014-08-22 DIAGNOSIS — I10 Essential (primary) hypertension: Secondary | ICD-10-CM

## 2014-08-22 DIAGNOSIS — E119 Type 2 diabetes mellitus without complications: Secondary | ICD-10-CM

## 2014-08-22 DIAGNOSIS — D631 Anemia in chronic kidney disease: Secondary | ICD-10-CM

## 2014-08-22 DIAGNOSIS — D649 Anemia, unspecified: Secondary | ICD-10-CM

## 2014-08-22 LAB — CBC WITH DIFFERENTIAL/PLATELET
BASO%: 0.3 % (ref 0.0–2.0)
Basophils Absolute: 0 10*3/uL (ref 0.0–0.1)
EOS ABS: 0.1 10*3/uL (ref 0.0–0.5)
EOS%: 0.6 % (ref 0.0–7.0)
HEMATOCRIT: 24.1 % — AB (ref 34.8–46.6)
HGB: 7.9 g/dL — ABNORMAL LOW (ref 11.6–15.9)
LYMPH%: 13.6 % — AB (ref 14.0–49.7)
MCH: 26.5 pg (ref 25.1–34.0)
MCHC: 32.8 g/dL (ref 31.5–36.0)
MCV: 80.9 fL (ref 79.5–101.0)
MONO#: 0.6 10*3/uL (ref 0.1–0.9)
MONO%: 5.9 % (ref 0.0–14.0)
NEUT#: 8.7 10*3/uL — ABNORMAL HIGH (ref 1.5–6.5)
NEUT%: 79.6 % — ABNORMAL HIGH (ref 38.4–76.8)
PLATELETS: 220 10*3/uL (ref 145–400)
RBC: 2.98 10*6/uL — ABNORMAL LOW (ref 3.70–5.45)
RDW: 16.3 % — ABNORMAL HIGH (ref 11.2–14.5)
WBC: 10.9 10*3/uL — ABNORMAL HIGH (ref 3.9–10.3)
lymph#: 1.5 10*3/uL (ref 0.9–3.3)
nRBC: 0 % (ref 0–0)

## 2014-08-22 MED ORDER — EPOETIN ALFA 40000 UNIT/ML IJ SOLN
40000.0000 [IU] | INTRAMUSCULAR | Status: DC
  Administered 2014-08-22: 40000 [IU] via SUBCUTANEOUS
  Filled 2014-08-22: qty 1

## 2014-08-22 NOTE — Telephone Encounter (Signed)
perpof to sch pt appt-gave pt copy of avs-sch w/[place]rill @ 9:00 due to transportation per pt req

## 2014-08-22 NOTE — Progress Notes (Addendum)
[facility] OFFICE PROGRESS NOTE   Diagnosis:  Pancreas cancer  INTERVAL HISTORY:   [name] returns as scheduled. She last received erythropoietin on 07/11/2014. She was unable to keep the appointment on 08/01/2014. She feels weak. She is short of breath at times. She denies bleeding. No black stools. She reports she has been enrolled in the hospice program in [place], [place]. She is living with her daughter. She denies pain. Appetite continues to be poor. She is losing weight. She has intermittent nausea.  Objective:  Vital signs in last 24 hours:  Blood pressure 137/48, pulse 74, temperature 98.6 F (37 C), temperature source Oral, resp. rate 18, height 5\' 8"  (1.727 m), weight 209 lb 14.4 oz (95.21 kg), SpO2 100 %.    HEENT: White coating over tongue.  Lymphatics: No palpable cervical, supraclavicular or axillary lymph nodes. Firm nodal mass left medial inguinal region. Resp: A few faint expiratory wheezes. No respiratory distress. Cardio: Regular rate and rhythm. GI: Abdomen soft and nontender. No hepatomegaly. Vascular: Chronic stasis changes at the lower legs bilaterally.  Lab Results:  Lab Results  Component Value Date   WBC 10.9* 08/22/2014   HGB 7.9* 08/22/2014   HCT 24.1* 08/22/2014   MCV 80.9 08/22/2014   PLT 220 08/22/2014   NEUTROABS 8.7* 08/22/2014    Imaging:  No results found.  Medications: I have reviewed the patient's current medications.  Assessment/Plan: 1. Pancreas cancer.  Abdominal ultrasound 06/21/2013 with intrahepatic and extra hepatic biliary ductal dilatation. Pancreatic duct borderline dilated.   MRCP 06/26/2013 with severe intra and extrahepatic biliary ductal dilatation. The common bile duct abruptly terminated in the region of the superior aspect of the pancreatic head where there was a masslike area of amorphous soft tissue.   Endoscopic ultrasound 06/29/2013 with significant intra-and extrahepatic biliary  ductal dilatation, pancreatic head mass that seemed to invade the portal confluence and superior mesenteric vein. A few scattered malignant appearing peripancreatic lymph nodes. EUS staging T3 N1 Mx.   Biopsy of the pancreas head mass showed malignant cells consistent with adenocarcinoma.   Staging PET scan with a hypermetabolic pancreas head mass, precordial nodule, and enlarged hypermetabolic left inguinal node 2. 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 3. Hypertension. 4. Diabetes. 5. Heart failure. 6. Chronic kidney disease. 7. Anemia. 8. Hypothyroid. 9. Sleep apnea. 10. Asthma. 11. Left shoulder pain-likely secondary to osteoarthritis, CT 03/31/2014 confirmed severe osteoarthritis of the left glenohumeral joint   Disposition: [name] performance status is declining. She is enrolled in the hospice program in [place].  She is more anemic on labs today. This is likely due to missing the erythropoietin injection 3 weeks ago. She is not aware of any bleeding. Plan to resume erythropoietin every 3 weeks beginning today. We discussed a blood transfusion. She declines this at present. We provided her with a copy of today's labs.  She will return in 3 weeks for labs and an erythropoietin injection. We will see her in follow-up in 6 weeks.  Patient seen with Dr. Benay Spice.    Ned Card ANP/GNP-BC   08/22/2014  10:42 AM  This was a shared visit with Ned Card. [name] appears to be declining secondary to metastatic pancreas cancer. She has enrolled in the Hospice program in [place]. She will resume erythropoietin therapy and we will  be sure she is taking iron. She will return for erythropoietin in 3 weeks and an office visit in 6 weeks. We are available to see her sooner as  needed.  Julieanne Manson, M.D.

## 2014-09-04 ENCOUNTER — Ambulatory Visit: Payer: Medicare PPO

## 2014-09-04 ENCOUNTER — Ambulatory Visit: Payer: Medicare PPO | Admitting: Nurse Practitioner

## 2014-09-04 ENCOUNTER — Other Ambulatory Visit: Payer: Medicare PPO

## 2014-09-12 ENCOUNTER — Other Ambulatory Visit (HOSPITAL_BASED_OUTPATIENT_CLINIC_OR_DEPARTMENT_OTHER)

## 2014-09-12 ENCOUNTER — Ambulatory Visit (HOSPITAL_BASED_OUTPATIENT_CLINIC_OR_DEPARTMENT_OTHER): Payer: Medicare Other

## 2014-09-12 VITALS — BP 114/53 | HR 84 | Temp 98.4°F

## 2014-09-12 DIAGNOSIS — D631 Anemia in chronic kidney disease: Secondary | ICD-10-CM | POA: Diagnosis not present

## 2014-09-12 DIAGNOSIS — N183 Chronic kidney disease, stage 3 unspecified: Secondary | ICD-10-CM

## 2014-09-12 DIAGNOSIS — C250 Malignant neoplasm of head of pancreas: Secondary | ICD-10-CM

## 2014-09-12 DIAGNOSIS — C259 Malignant neoplasm of pancreas, unspecified: Secondary | ICD-10-CM

## 2014-09-12 DIAGNOSIS — D649 Anemia, unspecified: Secondary | ICD-10-CM

## 2014-09-12 DIAGNOSIS — N189 Chronic kidney disease, unspecified: Secondary | ICD-10-CM

## 2014-09-12 LAB — CBC WITH DIFFERENTIAL/PLATELET
BASO%: 0.6 % (ref 0.0–2.0)
BASOS ABS: 0.1 10*3/uL (ref 0.0–0.1)
EOS ABS: 0.3 10*3/uL (ref 0.0–0.5)
EOS%: 3.5 % (ref 0.0–7.0)
HCT: 26.6 % — ABNORMAL LOW (ref 34.8–46.6)
HGB: 8.7 g/dL — ABNORMAL LOW (ref 11.6–15.9)
LYMPH%: 16.7 % (ref 14.0–49.7)
MCH: 26.9 pg (ref 25.1–34.0)
MCHC: 32.7 g/dL (ref 31.5–36.0)
MCV: 82.1 fL (ref 79.5–101.0)
MONO#: 0.6 10*3/uL (ref 0.1–0.9)
MONO%: 7.1 % (ref 0.0–14.0)
NEUT#: 6.4 10*3/uL (ref 1.5–6.5)
NEUT%: 72.1 % (ref 38.4–76.8)
PLATELETS: 230 10*3/uL (ref 145–400)
RBC: 3.24 10*6/uL — ABNORMAL LOW (ref 3.70–5.45)
RDW: 18.9 % — ABNORMAL HIGH (ref 11.2–14.5)
WBC: 8.9 10*3/uL (ref 3.9–10.3)
lymph#: 1.5 10*3/uL (ref 0.9–3.3)
nRBC: 0 % (ref 0–0)

## 2014-09-12 MED ORDER — EPOETIN ALFA 40000 UNIT/ML IJ SOLN
40000.0000 [IU] | INTRAMUSCULAR | Status: DC
  Administered 2014-09-12: 40000 [IU] via SUBCUTANEOUS
  Filled 2014-09-12: qty 1

## 2014-10-02 ENCOUNTER — Telehealth: Payer: Self-pay | Admitting: *Deleted

## 2014-10-02 NOTE — Telephone Encounter (Signed)
Called [name] with Dr. Gearldine Shown advice and orders.  "She doesn't have a Primary Care Provider in [place].  I need to get her records and get one.  I do not want her to miss the injection and will get her there tomorrow.  Appointment times provided.

## 2014-10-02 NOTE — Telephone Encounter (Signed)
Procrit is for anemia due to renal failure Primary MD can prescribe/deliver in [place] I cannot order there

## 2014-10-02 NOTE — Telephone Encounter (Signed)
Patient's daughter Chrishana Spargur called in need of "help for mom to obtain Procrit injections in [place] or [place], [place].  She is to come tomorrow but is too weak.  I asked Dr. Tish Frederickson if he can give her procrit and he says she is not on this for anything related to her Oncology or Hospice care and she is  Under a program with Dr. Benay Spice.  What needs to be done for her to get procrit because she is too weak to come to [facility] tomorrow."   Reports has lived in [place] with [facility] for a month.  Tiffany and her sister are P.O.A.  Procrit is for kidney disease which she had before cancer was found.   Asked if her records have been sent to Dr. Stanton Kidney. Call transferred to [facility] For medical records.

## 2014-10-03 ENCOUNTER — Ambulatory Visit (HOSPITAL_BASED_OUTPATIENT_CLINIC_OR_DEPARTMENT_OTHER): Payer: Medicare PPO | Admitting: Oncology

## 2014-10-03 ENCOUNTER — Other Ambulatory Visit (HOSPITAL_BASED_OUTPATIENT_CLINIC_OR_DEPARTMENT_OTHER): Payer: Medicare PPO

## 2014-10-03 ENCOUNTER — Ambulatory Visit (HOSPITAL_BASED_OUTPATIENT_CLINIC_OR_DEPARTMENT_OTHER)

## 2014-10-03 VITALS — BP 122/57 | HR 91 | Temp 97.8°F | Resp 19 | Ht 68.0 in | Wt 192.2 lb

## 2014-10-03 DIAGNOSIS — C250 Malignant neoplasm of head of pancreas: Secondary | ICD-10-CM | POA: Diagnosis not present

## 2014-10-03 DIAGNOSIS — D631 Anemia in chronic kidney disease: Secondary | ICD-10-CM

## 2014-10-03 DIAGNOSIS — N183 Chronic kidney disease, stage 3 unspecified: Secondary | ICD-10-CM

## 2014-10-03 DIAGNOSIS — N189 Chronic kidney disease, unspecified: Secondary | ICD-10-CM

## 2014-10-03 DIAGNOSIS — D649 Anemia, unspecified: Secondary | ICD-10-CM

## 2014-10-03 DIAGNOSIS — C259 Malignant neoplasm of pancreas, unspecified: Secondary | ICD-10-CM

## 2014-10-03 LAB — CBC WITH DIFFERENTIAL/PLATELET
BASO%: 0.4 % (ref 0.0–2.0)
BASOS ABS: 0 10*3/uL (ref 0.0–0.1)
EOS%: 2.4 % (ref 0.0–7.0)
Eosinophils Absolute: 0.2 10*3/uL (ref 0.0–0.5)
HEMATOCRIT: 25.2 % — AB (ref 34.8–46.6)
HEMOGLOBIN: 8.3 g/dL — AB (ref 11.6–15.9)
LYMPH#: 1.6 10*3/uL (ref 0.9–3.3)
LYMPH%: 17.4 % (ref 14.0–49.7)
MCH: 27.4 pg (ref 25.1–34.0)
MCHC: 32.9 g/dL (ref 31.5–36.0)
MCV: 83.2 fL (ref 79.5–101.0)
MONO#: 0.6 10*3/uL (ref 0.1–0.9)
MONO%: 6.4 % (ref 0.0–14.0)
NEUT#: 6.8 10*3/uL — ABNORMAL HIGH (ref 1.5–6.5)
NEUT%: 73.4 % (ref 38.4–76.8)
Platelets: 185 10*3/uL (ref 145–400)
RBC: 3.03 10*6/uL — ABNORMAL LOW (ref 3.70–5.45)
RDW: 19.8 % — ABNORMAL HIGH (ref 11.2–14.5)
WBC: 9.2 10*3/uL (ref 3.9–10.3)
nRBC: 0 % (ref 0–0)

## 2014-10-03 MED ORDER — DARBEPOETIN ALFA 100 MCG/0.5ML IJ SOSY
100.0000 ug | PREFILLED_SYRINGE | Freq: Once | INTRAMUSCULAR | Status: AC
  Administered 2014-10-03: 100 ug via SUBCUTANEOUS
  Filled 2014-10-03: qty 0.5

## 2014-10-03 NOTE — Progress Notes (Signed)
  [facility] OFFICE PROGRESS NOTE   Diagnosis: Pancreas cancer, anemia secondary to chronic renal failure  INTERVAL HISTORY:   [name] returns as scheduled. She reports anorexia and "weakness ". She has enrolled in a Hospice program. No pain. The lymph node in the left groin has not changed. She is living with her daughter in [place]  Objective:  Vital signs in last 24 hours:  Blood pressure 122/57, pulse 91, temperature 97.8 F (36.6 C), temperature source Oral, resp. rate 19, height 5\' 8"  (1.727 m), weight 192 lb 3.2 oz (87.181 kg), SpO2 100 %.    Lymphatics: 3-4 cm node in the left inguinal region Resp: Lungs with inspiratory rales at the posterior bases bilaterally Cardio: Regular rate and rhythm GI: Abdomen is soft, no hepatomegaly Vascular: Trace ankle edema bilaterally  Skin: Dry desquamation over the trunk     Lab Results:  Lab Results  Component Value Date   WBC 9.2 10/03/2014   HGB 8.3* 10/03/2014   HCT 25.2* 10/03/2014   MCV 83.2 10/03/2014   PLT 185 10/03/2014   NEUTROABS 6.8* 10/03/2014     Medications: I have reviewed the patient's current medications.  Assessment/Plan: 1. Pancreas cancer.  Abdominal ultrasound 06/21/2013 with intrahepatic and extra hepatic biliary ductal dilatation. Pancreatic duct borderline dilated.   MRCP 06/26/2013 with severe intra and extrahepatic biliary ductal dilatation. The common bile duct abruptly terminated in the region of the superior aspect of the pancreatic head where there was a masslike area of amorphous soft tissue.   Endoscopic ultrasound 06/29/2013 with significant intra-and extrahepatic biliary ductal dilatation, pancreatic head mass that seemed to invade the portal confluence and superior mesenteric vein. A few scattered malignant appearing peripancreatic lymph nodes. EUS staging T3 N1 Mx.   Biopsy of the pancreas head mass showed malignant cells consistent with adenocarcinoma.   Staging PET  scan with a hypermetabolic pancreas head mass, precordial nodule, and enlarged hypermetabolic left inguinal node 2. 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 3. Hypertension. 4. Diabetes. 5. Heart failure. 6. Chronic kidney disease. 7. Anemia secondary to chronic renal failure and chronic disease 8. Hypothyroid. 9. Sleep apnea. 10. Asthma. 11. Left shoulder pain-likely secondary to osteoarthritis, CT 03/31/2014 confirmed severe osteoarthritis of the left glenohumeral joint   Disposition:  [name] has metastatic pancreas cancer. Her performance status is declining and she is losing weight. We decided to discontinue her furosemide and diltiazem. She has persistent anemia secondary to chronic renal failure and "chronic disease ". She feels worse if the hemoglobin falls below 8. She would like to continue erythropoietin therapy. We administered a dose of Aranesp today. I will contact Dr. Earnestine Mealing to see if he will arrange for erythropoietin therapy at [facility] or in [place].  She is not scheduled for follow-up appointment here. I am available to see her as needed.  Betsy Coder, MD  10/03/2014  9:19 AM

## 2014-10-03 NOTE — Patient Instructions (Signed)
Darbepoetin Alfa injection What is this medicine? DARBEPOETIN ALFA (dar be POE e tin AL fa) helps your body make more red blood cells. It is used to treat anemia caused by chronic kidney failure and chemotherapy. This medicine may be used for other purposes; ask your health care Lakiyah Arntson or pharmacist if you have questions. COMMON BRAND NAME(S): Aranesp What should I tell my health care Terisha Losasso before I take this medicine? They need to know if you have any of these conditions: -blood clotting disorders or history of blood clots -cancer patient not on chemotherapy -cystic fibrosis -heart disease, such as angina, heart failure, or a history of a heart attack -hemoglobin level of 12 g/dL or greater -high blood pressure -low levels of folate, iron, or vitamin B12 -seizures -an unusual or allergic reaction to darbepoetin, erythropoietin, albumin, hamster proteins, latex, other medicines, foods, dyes, or preservatives -pregnant or trying to get pregnant -breast-feeding How should I use this medicine? This medicine is for injection into a vein or under the skin. It is usually given by a health care professional in a hospital or clinic setting. If you get this medicine at home, you will be taught how to prepare and give this medicine. Do not shake the solution before you withdraw a dose. Use exactly as directed. Take your medicine at regular intervals. Do not take your medicine more often than directed. It is important that you put your used needles and syringes in a special sharps container. Do not put them in a trash can. If you do not have a sharps container, call your pharmacist or healthcare Mehtab Dolberry to get one. Talk to your pediatrician regarding the use of this medicine in children. While this medicine may be used in children as young as 1 year for selected conditions, precautions do apply. Overdosage: If you think you have taken too much of this medicine contact a poison control center or  emergency room at once. NOTE: This medicine is only for you. Do not share this medicine with others. What if I miss a dose? If you miss a dose, take it as soon as you can. If it is almost time for your next dose, take only that dose. Do not take double or extra doses. What may interact with this medicine? Do not take this medicine with any of the following medications: -epoetin alfa This list may not describe all possible interactions. Give your health care Elexius Minar a list of all the medicines, herbs, non-prescription drugs, or dietary supplements you use. Also tell them if you smoke, drink alcohol, or use illegal drugs. Some items may interact with your medicine. What should I watch for while using this medicine? Visit your prescriber or health care professional for regular checks on your progress and for the needed blood tests and blood pressure measurements. It is especially important for the doctor to make sure your hemoglobin level is in the desired range, to limit the risk of potential side effects and to give you the best benefit. Keep all appointments for any recommended tests. Check your blood pressure as directed. Ask your doctor what your blood pressure should be and when you should contact him or her. As your body makes more red blood cells, you may need to take iron, folic acid, or vitamin B supplements. Ask your doctor or health care Cordelle Dahmen which products are right for you. If you have kidney disease continue dietary restrictions, even though this medication can make you feel better. Talk with your doctor or health   care professional about the foods you eat and the vitamins that you take. What side effects may I notice from receiving this medicine? Side effects that you should report to your doctor or health care professional as soon as possible: -allergic reactions like skin rash, itching or hives, swelling of the face, lips, or tongue -breathing problems -changes in vision -chest  pain -confusion, trouble speaking or understanding -feeling faint or lightheaded, falls -high blood pressure -muscle aches or pains -pain, swelling, warmth in the leg -rapid weight gain -severe headaches -sudden numbness or weakness of the face, arm or leg -trouble walking, dizziness, loss of balance or coordination -seizures (convulsions) -swelling of the ankles, feet, hands -unusually weak or tired Side effects that usually do not require medical attention (report to your doctor or health care professional if they continue or are bothersome): -diarrhea -fever, chills (flu-like symptoms) -headaches -nausea, vomiting -redness, stinging, or swelling at site where injected This list may not describe all possible side effects. Call your doctor for medical advice about side effects. You may report side effects to FDA at 1-800-FDA-1088. Where should I keep my medicine? Keep out of the reach of children. Store in a refrigerator between 2 and 8 degrees C (36 and 46 degrees F). Do not freeze. Do not shake. Throw away any unused portion if using a single-dose vial. Throw away any unused medicine after the expiration date. NOTE: This sheet is a summary. It may not cover all possible information. If you have questions about this medicine, talk to your doctor, pharmacist, or health care Elnita Surprenant.  2015, [employer]. (2007-12-07 10:23:57)  

## 2014-10-23 ENCOUNTER — Telehealth: Payer: Self-pay | Admitting: *Deleted

## 2014-10-23 NOTE — Telephone Encounter (Signed)
Spoke with patient and informed her that MD Benay Spice spoke to MD Abbruzzese today concerning her procrit injections. Informed patient that their off should be contacting her to schedule upcoming appointments and records don't need to be sent because that information can be looked at through the system. Patient verbalized consent.

## 2014-11-07 ENCOUNTER — Ambulatory Visit: Payer: Medicare PPO | Admitting: Podiatry

## 2014-11-16 ENCOUNTER — Telehealth: Payer: Self-pay | Admitting: *Deleted

## 2014-11-16 NOTE — Telephone Encounter (Signed)
Received voice message from [facility] notifying office that pt passed away today @ 12 noon.  Dr. Benay Spice made aware.

## 2014-12-07 DEATH — deceased

## 2014-12-21 ENCOUNTER — Other Ambulatory Visit: Payer: Self-pay | Admitting: Endocrinology

## 2016-08-25 IMAGING — CT CT HUMERUS*L* W/O CM
4 series · 17 of 33 positions shown, 19 images · non-contrast
Comparison: None.

CLINICAL DATA: Left upper humeral pain.

EXAM:
CT OF THE LEFT HUMERUS WITHOUT CONTRAST
TECHNIQUE: Multidetector CT imaging was performed according to the standard
protocol. Multiplanar CT image reconstructions were also generated.

[Series 7: shoulder st · axial · 0.39mm/px · z∈[-354,-114]mm · 4 of 134 slices shown, 5 images]
[im 27/134  soft-tissue]
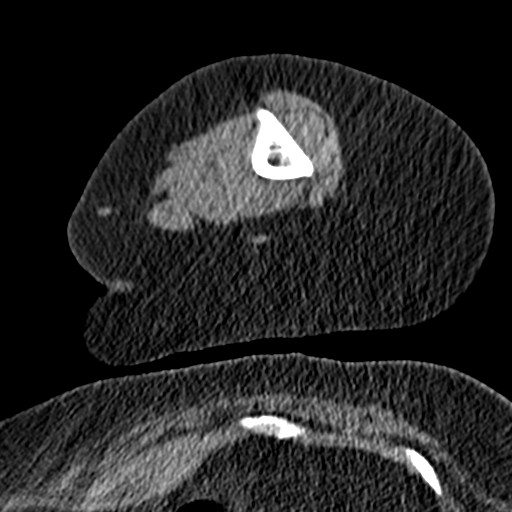
[im 27/134  bone]
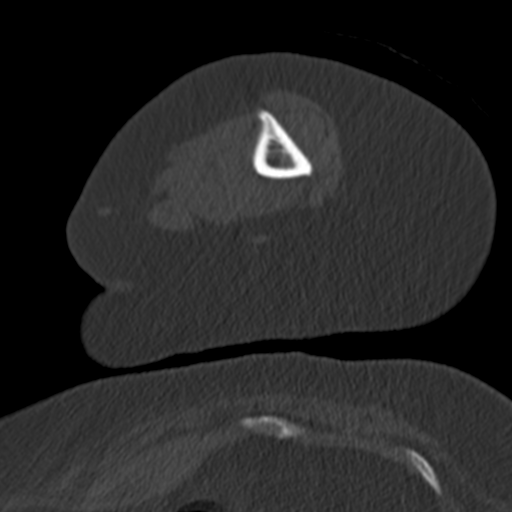
[im 54/134  bone]
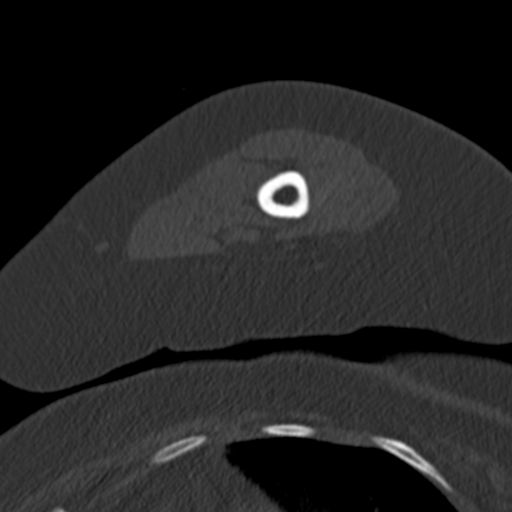
[im 80/134  bone]
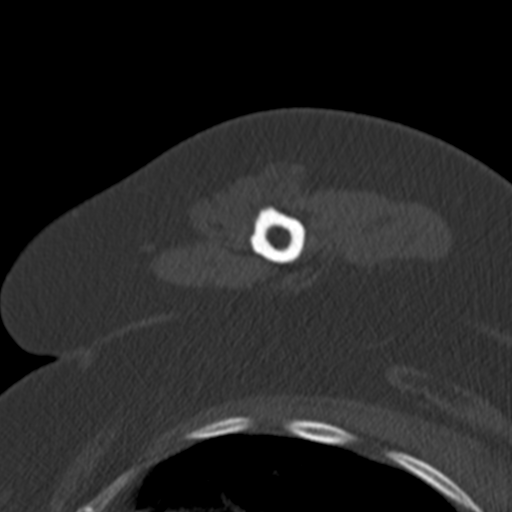
[im 107/134  bone]
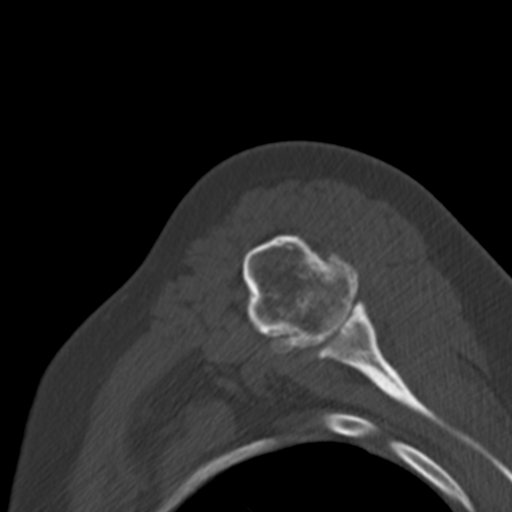

[Series 602: <mpr thick range> · coronal · 0.79mm/px · 3 of 48 slices shown]
[im 10/48  bone]
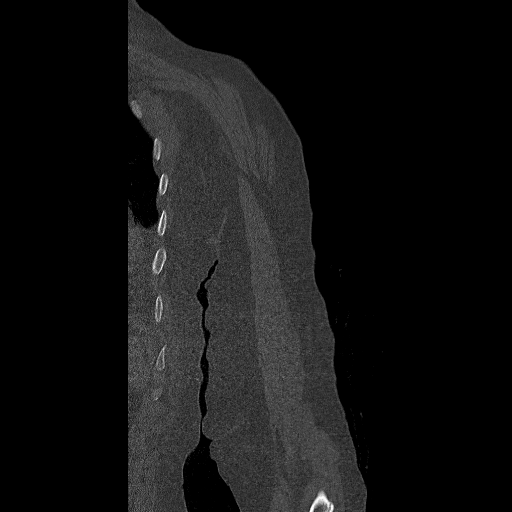
[im 19/48  bone]
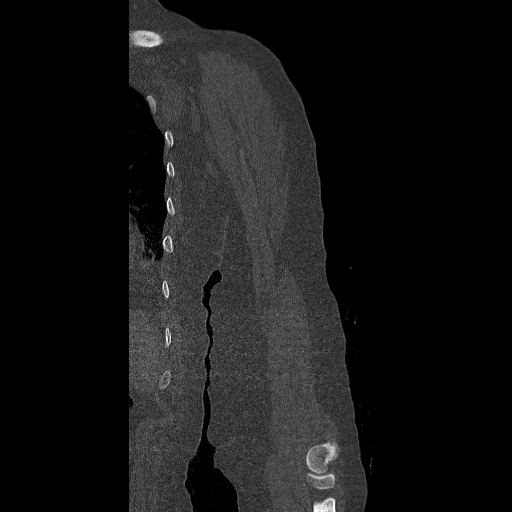
[im 29/48  bone]
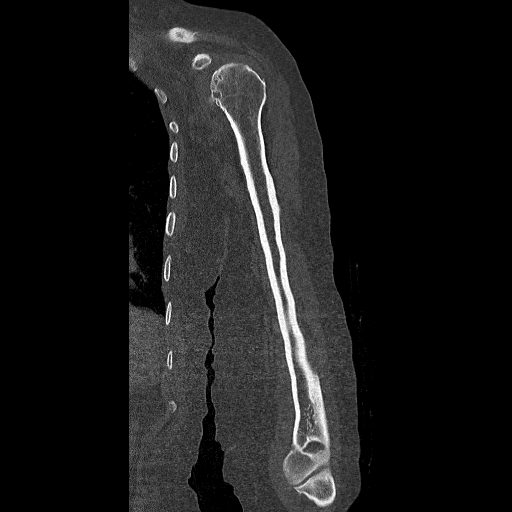

[Series 603: <mpr thick range(1)> · sagittal · 0.79mm/px · 5 of 58 slices shown, 6 images]
[im 20/58  bone]
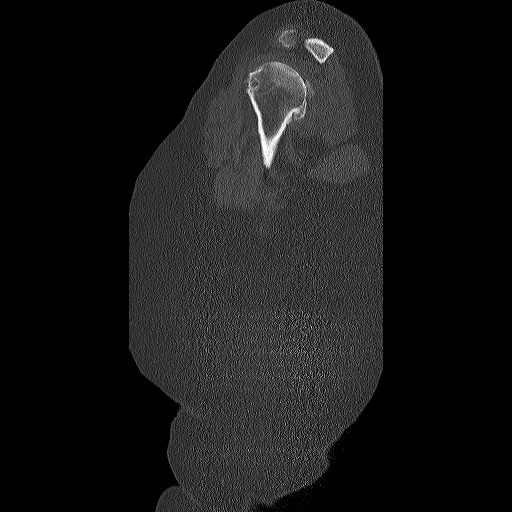
[im 24/58  bone]
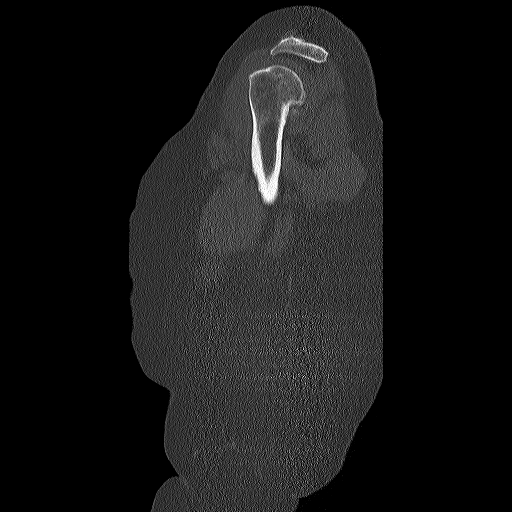
[im 29/58  soft-tissue]
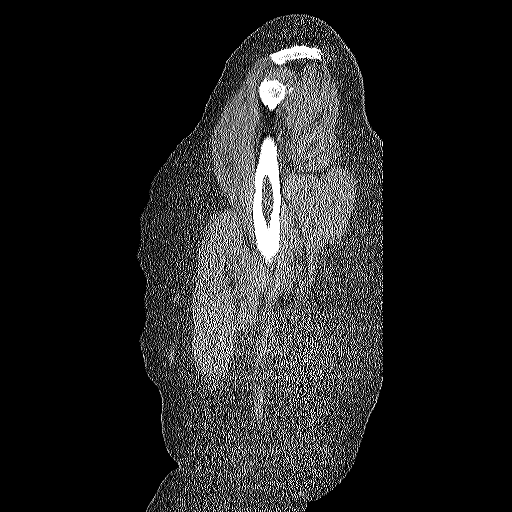
[im 29/58  bone]
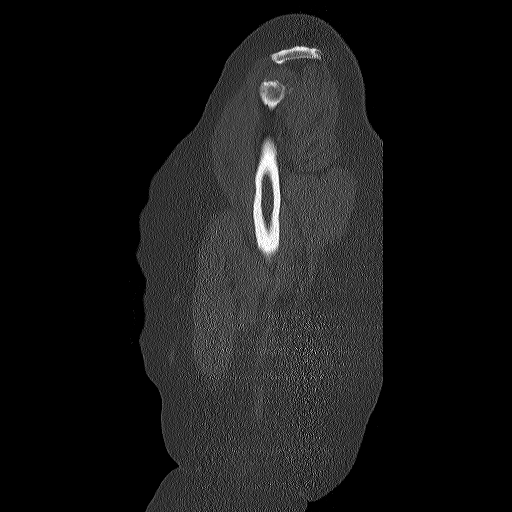
[im 34/58  bone]
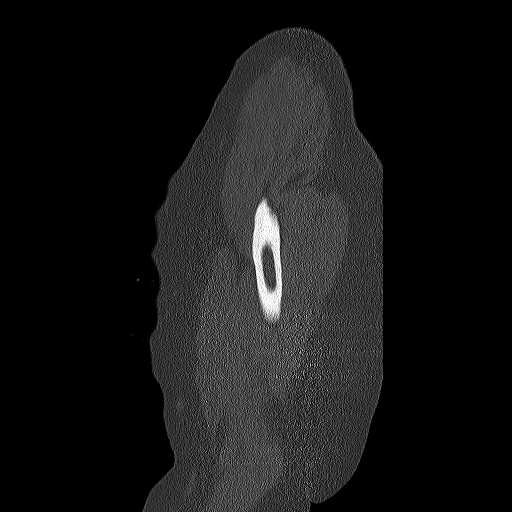
[im 39/58  bone]
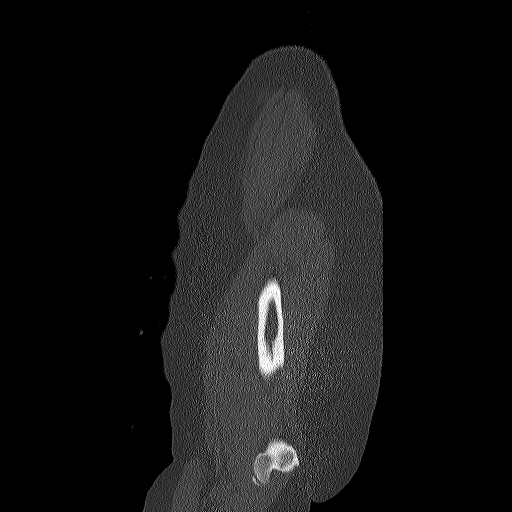

[Series 604: <mpr thick range(2)> · axial · 0.79mm/px · z∈[-386,-172]mm · 5 of 187 slices shown]
[im 27/187  bone]
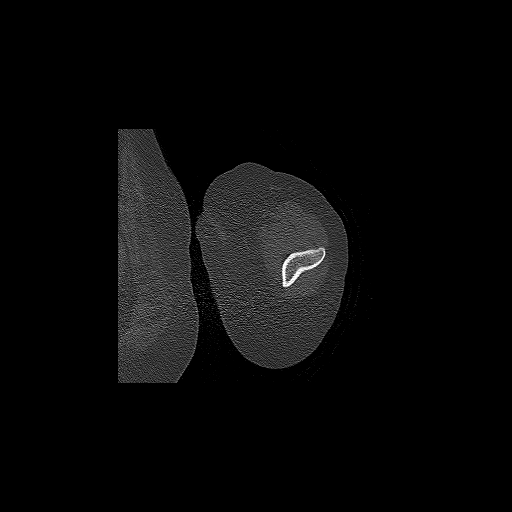
[im 54/187  bone]
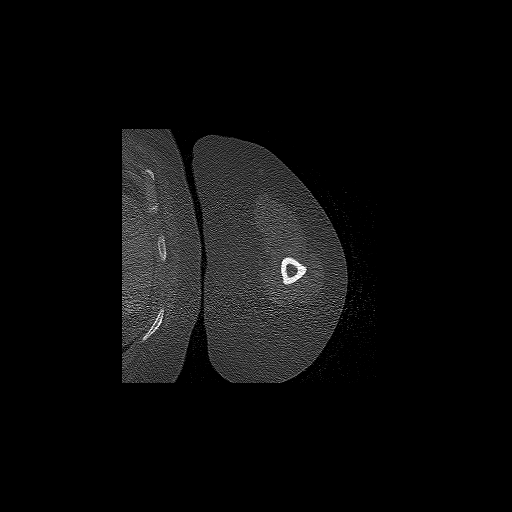
[im 80/187  bone]
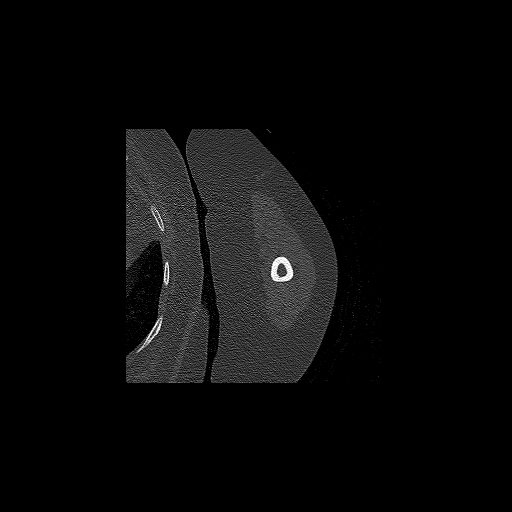
[im 107/187  bone]
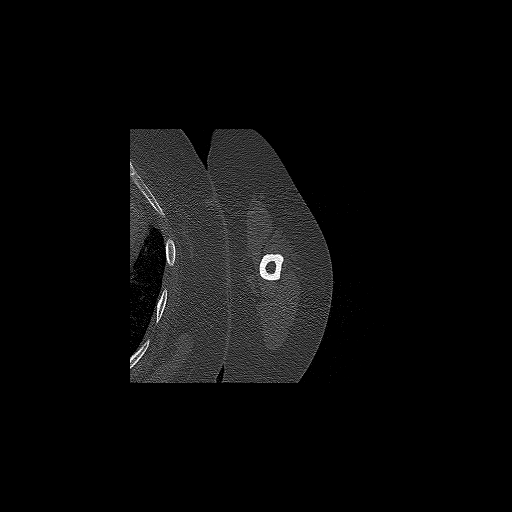
[im 133/187  bone]
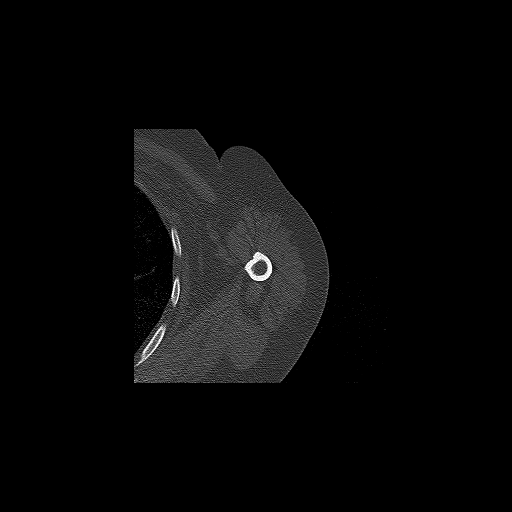

[17 of 33 positions shown; findings below may reference images not displayed]

FINDINGS: No acute fracture or dislocation. No lytic or sclerotic osseous
lesion. Severe osteoarthritis of the left glenohumeral joint with
severe joint space narrowing, marginal osteophytosis and subchondral
cystic changes. Multiple loose bodies in the inferior joint space
with the largest measuring 16 mm.

Mild degenerative changes of the acromioclavicular joint. Type II
acromion. The muscles are normal. There is no muscle atrophy. There
is no hematoma or soft tissue mass. There is no left axillary
lymphadenopathy. The visualized left lung is clear.
IMPRESSION: 1. Severe osteoarthritis of the left glenohumeral joint.

## 2016-12-06 ENCOUNTER — Other Ambulatory Visit: Payer: Self-pay | Admitting: Nurse Practitioner
